# Patient Record
Sex: Male | Born: 1950
Health system: Southern US, Community
[De-identification: ages and names within clinical notes are randomized; demographics above are authoritative.]

## PROBLEM LIST (undated history)

## (undated) DIAGNOSIS — E119 Type 2 diabetes mellitus without complications: Secondary | ICD-10-CM

## (undated) DIAGNOSIS — I509 Heart failure, unspecified: Secondary | ICD-10-CM

## (undated) DIAGNOSIS — I4891 Unspecified atrial fibrillation: Secondary | ICD-10-CM

## (undated) DIAGNOSIS — Z95810 Presence of automatic (implantable) cardiac defibrillator: Secondary | ICD-10-CM

## (undated) DIAGNOSIS — E039 Hypothyroidism, unspecified: Secondary | ICD-10-CM

## (undated) DIAGNOSIS — I429 Cardiomyopathy, unspecified: Secondary | ICD-10-CM

## (undated) DIAGNOSIS — N189 Chronic kidney disease, unspecified: Secondary | ICD-10-CM

## (undated) DIAGNOSIS — I2510 Atherosclerotic heart disease of native coronary artery without angina pectoris: Secondary | ICD-10-CM

## (undated) DIAGNOSIS — I219 Acute myocardial infarction, unspecified: Secondary | ICD-10-CM

## (undated) DIAGNOSIS — D649 Anemia, unspecified: Secondary | ICD-10-CM

## (undated) DIAGNOSIS — M869 Osteomyelitis, unspecified: Secondary | ICD-10-CM

## (undated) DIAGNOSIS — D696 Thrombocytopenia, unspecified: Secondary | ICD-10-CM

## (undated) HISTORY — PX: CORONARY ARTERY BYPASS GRAFT: SHX141

---

## 2006-09-29 ENCOUNTER — Ambulatory Visit: Payer: Self-pay | Admitting: Cardiothoracic Surgery

## 2006-10-06 ENCOUNTER — Ambulatory Visit: Payer: Self-pay | Admitting: Cardiothoracic Surgery

## 2006-11-18 ENCOUNTER — Ambulatory Visit: Payer: Self-pay | Admitting: Cardiothoracic Surgery

## 2015-05-29 DIAGNOSIS — Z95810 Presence of automatic (implantable) cardiac defibrillator: Secondary | ICD-10-CM | POA: Insufficient documentation

## 2015-05-29 DIAGNOSIS — I429 Cardiomyopathy, unspecified: Secondary | ICD-10-CM | POA: Insufficient documentation

## 2015-08-15 DIAGNOSIS — E1142 Type 2 diabetes mellitus with diabetic polyneuropathy: Secondary | ICD-10-CM | POA: Insufficient documentation

## 2015-08-15 DIAGNOSIS — E11610 Type 2 diabetes mellitus with diabetic neuropathic arthropathy: Secondary | ICD-10-CM | POA: Insufficient documentation

## 2015-08-15 DIAGNOSIS — S98132A Complete traumatic amputation of one left lesser toe, initial encounter: Secondary | ICD-10-CM | POA: Insufficient documentation

## 2015-10-04 DIAGNOSIS — I484 Atypical atrial flutter: Secondary | ICD-10-CM | POA: Insufficient documentation

## 2015-10-04 DIAGNOSIS — I10 Essential (primary) hypertension: Secondary | ICD-10-CM | POA: Insufficient documentation

## 2015-10-04 DIAGNOSIS — I255 Ischemic cardiomyopathy: Secondary | ICD-10-CM | POA: Insufficient documentation

## 2015-10-04 DIAGNOSIS — E7800 Pure hypercholesterolemia, unspecified: Secondary | ICD-10-CM | POA: Insufficient documentation

## 2016-02-13 DIAGNOSIS — E11610 Type 2 diabetes mellitus with diabetic neuropathic arthropathy: Secondary | ICD-10-CM | POA: Insufficient documentation

## 2016-05-12 DIAGNOSIS — N179 Acute kidney failure, unspecified: Secondary | ICD-10-CM

## 2016-05-12 DIAGNOSIS — N390 Urinary tract infection, site not specified: Secondary | ICD-10-CM

## 2016-05-13 DIAGNOSIS — R7881 Bacteremia: Secondary | ICD-10-CM

## 2016-05-13 DIAGNOSIS — D696 Thrombocytopenia, unspecified: Secondary | ICD-10-CM

## 2016-05-13 DIAGNOSIS — N390 Urinary tract infection, site not specified: Secondary | ICD-10-CM | POA: Diagnosis not present

## 2016-05-13 DIAGNOSIS — D649 Anemia, unspecified: Secondary | ICD-10-CM

## 2016-05-13 DIAGNOSIS — N179 Acute kidney failure, unspecified: Secondary | ICD-10-CM | POA: Diagnosis not present

## 2016-05-14 DIAGNOSIS — R7881 Bacteremia: Secondary | ICD-10-CM | POA: Diagnosis not present

## 2016-05-14 DIAGNOSIS — D649 Anemia, unspecified: Secondary | ICD-10-CM | POA: Diagnosis not present

## 2016-05-14 DIAGNOSIS — N390 Urinary tract infection, site not specified: Secondary | ICD-10-CM | POA: Diagnosis not present

## 2016-05-14 DIAGNOSIS — N179 Acute kidney failure, unspecified: Secondary | ICD-10-CM | POA: Diagnosis not present

## 2016-05-15 DIAGNOSIS — E11621 Type 2 diabetes mellitus with foot ulcer: Secondary | ICD-10-CM | POA: Diagnosis not present

## 2016-05-15 DIAGNOSIS — L97509 Non-pressure chronic ulcer of other part of unspecified foot with unspecified severity: Secondary | ICD-10-CM

## 2016-05-16 ENCOUNTER — Encounter: Payer: Self-pay | Admitting: Sports Medicine

## 2016-05-16 DIAGNOSIS — R7881 Bacteremia: Secondary | ICD-10-CM

## 2016-05-16 DIAGNOSIS — A4101 Sepsis due to Methicillin susceptible Staphylococcus aureus: Secondary | ICD-10-CM | POA: Diagnosis not present

## 2016-05-16 DIAGNOSIS — N179 Acute kidney failure, unspecified: Secondary | ICD-10-CM

## 2016-05-16 DIAGNOSIS — L97501 Non-pressure chronic ulcer of other part of unspecified foot limited to breakdown of skin: Secondary | ICD-10-CM

## 2016-05-16 DIAGNOSIS — E11621 Type 2 diabetes mellitus with foot ulcer: Secondary | ICD-10-CM

## 2016-05-17 DIAGNOSIS — N179 Acute kidney failure, unspecified: Secondary | ICD-10-CM | POA: Diagnosis not present

## 2016-05-17 DIAGNOSIS — A4101 Sepsis due to Methicillin susceptible Staphylococcus aureus: Secondary | ICD-10-CM | POA: Diagnosis not present

## 2016-05-17 DIAGNOSIS — E11621 Type 2 diabetes mellitus with foot ulcer: Secondary | ICD-10-CM | POA: Diagnosis not present

## 2016-05-17 DIAGNOSIS — R7881 Bacteremia: Secondary | ICD-10-CM | POA: Diagnosis not present

## 2016-05-18 DIAGNOSIS — E11621 Type 2 diabetes mellitus with foot ulcer: Secondary | ICD-10-CM | POA: Diagnosis not present

## 2016-05-18 DIAGNOSIS — A4101 Sepsis due to Methicillin susceptible Staphylococcus aureus: Secondary | ICD-10-CM | POA: Diagnosis not present

## 2016-05-18 DIAGNOSIS — N179 Acute kidney failure, unspecified: Secondary | ICD-10-CM | POA: Diagnosis not present

## 2016-05-18 DIAGNOSIS — R7881 Bacteremia: Secondary | ICD-10-CM | POA: Diagnosis not present

## 2016-05-22 ENCOUNTER — Telehealth: Payer: Self-pay | Admitting: *Deleted

## 2016-05-22 NOTE — Telephone Encounter (Signed)
Jose Heath states she wanted to make sure the latest orders were signed for wound care and PT.

## 2016-05-22 NOTE — Telephone Encounter (Signed)
Malachy Mood should have faxed it. I signed off. Im ok with wet to dry until he comes to the office. Good for PT, gait and balance as well. -Dr. Cannon Kettle

## 2016-05-30 DIAGNOSIS — I482 Chronic atrial fibrillation: Secondary | ICD-10-CM

## 2016-05-30 DIAGNOSIS — R296 Repeated falls: Secondary | ICD-10-CM

## 2016-05-30 DIAGNOSIS — R441 Visual hallucinations: Secondary | ICD-10-CM | POA: Diagnosis not present

## 2016-05-30 DIAGNOSIS — E11621 Type 2 diabetes mellitus with foot ulcer: Secondary | ICD-10-CM | POA: Diagnosis not present

## 2016-05-30 DIAGNOSIS — I10 Essential (primary) hypertension: Secondary | ICD-10-CM

## 2016-05-30 DIAGNOSIS — N179 Acute kidney failure, unspecified: Secondary | ICD-10-CM | POA: Diagnosis not present

## 2016-05-30 DIAGNOSIS — E039 Hypothyroidism, unspecified: Secondary | ICD-10-CM

## 2016-05-30 DIAGNOSIS — A4901 Methicillin susceptible Staphylococcus aureus infection, unspecified site: Secondary | ICD-10-CM | POA: Diagnosis not present

## 2016-05-30 DIAGNOSIS — E871 Hypo-osmolality and hyponatremia: Secondary | ICD-10-CM | POA: Diagnosis not present

## 2016-05-30 DIAGNOSIS — M7989 Other specified soft tissue disorders: Secondary | ICD-10-CM

## 2016-05-30 DIAGNOSIS — E860 Dehydration: Secondary | ICD-10-CM | POA: Diagnosis not present

## 2016-05-30 DIAGNOSIS — E118 Type 2 diabetes mellitus with unspecified complications: Secondary | ICD-10-CM | POA: Diagnosis not present

## 2016-05-30 DIAGNOSIS — D649 Anemia, unspecified: Secondary | ICD-10-CM | POA: Diagnosis not present

## 2016-05-30 DIAGNOSIS — R531 Weakness: Secondary | ICD-10-CM

## 2016-05-30 DIAGNOSIS — R748 Abnormal levels of other serum enzymes: Secondary | ICD-10-CM | POA: Diagnosis not present

## 2016-05-31 DIAGNOSIS — E118 Type 2 diabetes mellitus with unspecified complications: Secondary | ICD-10-CM | POA: Diagnosis not present

## 2016-05-31 DIAGNOSIS — R296 Repeated falls: Secondary | ICD-10-CM | POA: Diagnosis not present

## 2016-05-31 DIAGNOSIS — E11621 Type 2 diabetes mellitus with foot ulcer: Secondary | ICD-10-CM | POA: Diagnosis not present

## 2016-05-31 DIAGNOSIS — E860 Dehydration: Secondary | ICD-10-CM | POA: Diagnosis not present

## 2016-05-31 DIAGNOSIS — R441 Visual hallucinations: Secondary | ICD-10-CM | POA: Diagnosis not present

## 2016-05-31 DIAGNOSIS — N179 Acute kidney failure, unspecified: Secondary | ICD-10-CM | POA: Diagnosis not present

## 2016-05-31 DIAGNOSIS — R748 Abnormal levels of other serum enzymes: Secondary | ICD-10-CM | POA: Diagnosis not present

## 2016-05-31 DIAGNOSIS — R531 Weakness: Secondary | ICD-10-CM | POA: Diagnosis not present

## 2016-05-31 DIAGNOSIS — D649 Anemia, unspecified: Secondary | ICD-10-CM | POA: Diagnosis not present

## 2016-05-31 DIAGNOSIS — A4901 Methicillin susceptible Staphylococcus aureus infection, unspecified site: Secondary | ICD-10-CM | POA: Diagnosis not present

## 2016-05-31 DIAGNOSIS — E871 Hypo-osmolality and hyponatremia: Secondary | ICD-10-CM | POA: Diagnosis not present

## 2016-05-31 DIAGNOSIS — M7989 Other specified soft tissue disorders: Secondary | ICD-10-CM | POA: Diagnosis not present

## 2016-06-01 DIAGNOSIS — R748 Abnormal levels of other serum enzymes: Secondary | ICD-10-CM | POA: Diagnosis not present

## 2016-06-01 DIAGNOSIS — A4901 Methicillin susceptible Staphylococcus aureus infection, unspecified site: Secondary | ICD-10-CM | POA: Diagnosis not present

## 2016-06-01 DIAGNOSIS — M7989 Other specified soft tissue disorders: Secondary | ICD-10-CM | POA: Diagnosis not present

## 2016-06-01 DIAGNOSIS — R296 Repeated falls: Secondary | ICD-10-CM | POA: Diagnosis not present

## 2016-06-01 DIAGNOSIS — R531 Weakness: Secondary | ICD-10-CM | POA: Diagnosis not present

## 2016-06-01 DIAGNOSIS — N179 Acute kidney failure, unspecified: Secondary | ICD-10-CM | POA: Diagnosis not present

## 2016-06-01 DIAGNOSIS — D649 Anemia, unspecified: Secondary | ICD-10-CM | POA: Diagnosis not present

## 2016-06-01 DIAGNOSIS — R441 Visual hallucinations: Secondary | ICD-10-CM | POA: Diagnosis not present

## 2016-06-01 DIAGNOSIS — E118 Type 2 diabetes mellitus with unspecified complications: Secondary | ICD-10-CM | POA: Diagnosis not present

## 2016-06-01 DIAGNOSIS — E11621 Type 2 diabetes mellitus with foot ulcer: Secondary | ICD-10-CM | POA: Diagnosis not present

## 2016-06-01 DIAGNOSIS — E860 Dehydration: Secondary | ICD-10-CM | POA: Diagnosis not present

## 2016-06-01 DIAGNOSIS — E871 Hypo-osmolality and hyponatremia: Secondary | ICD-10-CM | POA: Diagnosis not present

## 2016-06-02 DIAGNOSIS — R441 Visual hallucinations: Secondary | ICD-10-CM | POA: Diagnosis not present

## 2016-06-02 DIAGNOSIS — E118 Type 2 diabetes mellitus with unspecified complications: Secondary | ICD-10-CM | POA: Diagnosis not present

## 2016-06-02 DIAGNOSIS — E11621 Type 2 diabetes mellitus with foot ulcer: Secondary | ICD-10-CM | POA: Diagnosis not present

## 2016-06-02 DIAGNOSIS — R748 Abnormal levels of other serum enzymes: Secondary | ICD-10-CM | POA: Diagnosis not present

## 2016-06-02 DIAGNOSIS — N179 Acute kidney failure, unspecified: Secondary | ICD-10-CM | POA: Diagnosis not present

## 2016-06-02 DIAGNOSIS — R531 Weakness: Secondary | ICD-10-CM | POA: Diagnosis not present

## 2016-06-02 DIAGNOSIS — R296 Repeated falls: Secondary | ICD-10-CM | POA: Diagnosis not present

## 2016-06-02 DIAGNOSIS — M7989 Other specified soft tissue disorders: Secondary | ICD-10-CM | POA: Diagnosis not present

## 2016-06-02 DIAGNOSIS — D649 Anemia, unspecified: Secondary | ICD-10-CM | POA: Diagnosis not present

## 2016-06-02 DIAGNOSIS — A4901 Methicillin susceptible Staphylococcus aureus infection, unspecified site: Secondary | ICD-10-CM | POA: Diagnosis not present

## 2016-06-02 DIAGNOSIS — E871 Hypo-osmolality and hyponatremia: Secondary | ICD-10-CM | POA: Diagnosis not present

## 2016-06-02 DIAGNOSIS — E860 Dehydration: Secondary | ICD-10-CM | POA: Diagnosis not present

## 2016-06-03 DIAGNOSIS — A4901 Methicillin susceptible Staphylococcus aureus infection, unspecified site: Secondary | ICD-10-CM | POA: Diagnosis not present

## 2016-06-03 DIAGNOSIS — D649 Anemia, unspecified: Secondary | ICD-10-CM | POA: Diagnosis not present

## 2016-06-03 DIAGNOSIS — R748 Abnormal levels of other serum enzymes: Secondary | ICD-10-CM | POA: Diagnosis not present

## 2016-06-03 DIAGNOSIS — N179 Acute kidney failure, unspecified: Secondary | ICD-10-CM | POA: Diagnosis not present

## 2016-06-03 DIAGNOSIS — E871 Hypo-osmolality and hyponatremia: Secondary | ICD-10-CM | POA: Diagnosis not present

## 2016-06-03 DIAGNOSIS — E11621 Type 2 diabetes mellitus with foot ulcer: Secondary | ICD-10-CM | POA: Diagnosis not present

## 2016-06-03 DIAGNOSIS — E860 Dehydration: Secondary | ICD-10-CM | POA: Diagnosis not present

## 2016-06-03 DIAGNOSIS — R296 Repeated falls: Secondary | ICD-10-CM | POA: Diagnosis not present

## 2016-06-03 DIAGNOSIS — E118 Type 2 diabetes mellitus with unspecified complications: Secondary | ICD-10-CM | POA: Diagnosis not present

## 2016-06-03 DIAGNOSIS — R441 Visual hallucinations: Secondary | ICD-10-CM | POA: Diagnosis not present

## 2016-06-03 DIAGNOSIS — M7989 Other specified soft tissue disorders: Secondary | ICD-10-CM | POA: Diagnosis not present

## 2016-06-03 DIAGNOSIS — R531 Weakness: Secondary | ICD-10-CM | POA: Diagnosis not present

## 2016-06-04 ENCOUNTER — Ambulatory Visit: Payer: Medicare Other | Admitting: Sports Medicine

## 2016-07-09 ENCOUNTER — Encounter: Payer: Self-pay | Admitting: Sports Medicine

## 2016-07-30 DIAGNOSIS — R601 Generalized edema: Secondary | ICD-10-CM | POA: Insufficient documentation

## 2016-08-13 DIAGNOSIS — E038 Other specified hypothyroidism: Secondary | ICD-10-CM | POA: Insufficient documentation

## 2016-08-27 DIAGNOSIS — L0390 Cellulitis, unspecified: Secondary | ICD-10-CM | POA: Diagnosis not present

## 2016-08-27 DIAGNOSIS — M869 Osteomyelitis, unspecified: Secondary | ICD-10-CM | POA: Diagnosis not present

## 2016-08-27 DIAGNOSIS — E039 Hypothyroidism, unspecified: Secondary | ICD-10-CM | POA: Diagnosis not present

## 2016-08-27 DIAGNOSIS — E11621 Type 2 diabetes mellitus with foot ulcer: Secondary | ICD-10-CM | POA: Diagnosis not present

## 2016-08-27 DIAGNOSIS — A480 Gas gangrene: Secondary | ICD-10-CM | POA: Diagnosis not present

## 2016-08-27 DIAGNOSIS — I482 Chronic atrial fibrillation: Secondary | ICD-10-CM

## 2016-08-27 DIAGNOSIS — E118 Type 2 diabetes mellitus with unspecified complications: Secondary | ICD-10-CM | POA: Diagnosis not present

## 2016-08-28 DIAGNOSIS — E118 Type 2 diabetes mellitus with unspecified complications: Secondary | ICD-10-CM | POA: Diagnosis not present

## 2016-08-28 DIAGNOSIS — A480 Gas gangrene: Secondary | ICD-10-CM

## 2016-08-28 DIAGNOSIS — L0390 Cellulitis, unspecified: Secondary | ICD-10-CM

## 2016-08-28 DIAGNOSIS — M869 Osteomyelitis, unspecified: Secondary | ICD-10-CM

## 2016-08-28 DIAGNOSIS — E039 Hypothyroidism, unspecified: Secondary | ICD-10-CM | POA: Diagnosis not present

## 2016-08-28 DIAGNOSIS — E11621 Type 2 diabetes mellitus with foot ulcer: Secondary | ICD-10-CM | POA: Diagnosis not present

## 2016-08-28 DIAGNOSIS — I482 Chronic atrial fibrillation: Secondary | ICD-10-CM | POA: Diagnosis not present

## 2016-08-29 ENCOUNTER — Encounter: Payer: Self-pay | Admitting: Sports Medicine

## 2016-08-29 DIAGNOSIS — A480 Gas gangrene: Secondary | ICD-10-CM | POA: Diagnosis not present

## 2016-08-29 DIAGNOSIS — M869 Osteomyelitis, unspecified: Secondary | ICD-10-CM | POA: Diagnosis not present

## 2016-08-29 DIAGNOSIS — I482 Chronic atrial fibrillation: Secondary | ICD-10-CM | POA: Diagnosis not present

## 2016-08-29 DIAGNOSIS — L0390 Cellulitis, unspecified: Secondary | ICD-10-CM | POA: Diagnosis not present

## 2016-08-29 DIAGNOSIS — E039 Hypothyroidism, unspecified: Secondary | ICD-10-CM | POA: Diagnosis not present

## 2016-08-30 DIAGNOSIS — E039 Hypothyroidism, unspecified: Secondary | ICD-10-CM | POA: Diagnosis not present

## 2016-08-30 DIAGNOSIS — L0390 Cellulitis, unspecified: Secondary | ICD-10-CM | POA: Diagnosis not present

## 2016-08-30 DIAGNOSIS — I482 Chronic atrial fibrillation: Secondary | ICD-10-CM | POA: Diagnosis not present

## 2016-08-30 DIAGNOSIS — A480 Gas gangrene: Secondary | ICD-10-CM | POA: Diagnosis not present

## 2016-08-30 DIAGNOSIS — M869 Osteomyelitis, unspecified: Secondary | ICD-10-CM | POA: Diagnosis not present

## 2016-08-31 DIAGNOSIS — M869 Osteomyelitis, unspecified: Secondary | ICD-10-CM | POA: Diagnosis not present

## 2016-08-31 DIAGNOSIS — L0390 Cellulitis, unspecified: Secondary | ICD-10-CM | POA: Diagnosis not present

## 2016-08-31 DIAGNOSIS — A480 Gas gangrene: Secondary | ICD-10-CM | POA: Diagnosis not present

## 2016-08-31 DIAGNOSIS — E118 Type 2 diabetes mellitus with unspecified complications: Secondary | ICD-10-CM | POA: Diagnosis not present

## 2016-08-31 DIAGNOSIS — E039 Hypothyroidism, unspecified: Secondary | ICD-10-CM | POA: Diagnosis not present

## 2016-08-31 DIAGNOSIS — E11621 Type 2 diabetes mellitus with foot ulcer: Secondary | ICD-10-CM | POA: Diagnosis not present

## 2016-08-31 DIAGNOSIS — I482 Chronic atrial fibrillation: Secondary | ICD-10-CM | POA: Diagnosis not present

## 2016-09-01 DIAGNOSIS — I482 Chronic atrial fibrillation: Secondary | ICD-10-CM | POA: Diagnosis not present

## 2016-09-01 DIAGNOSIS — E039 Hypothyroidism, unspecified: Secondary | ICD-10-CM | POA: Diagnosis not present

## 2016-09-01 DIAGNOSIS — L0390 Cellulitis, unspecified: Secondary | ICD-10-CM | POA: Diagnosis not present

## 2016-09-01 DIAGNOSIS — E118 Type 2 diabetes mellitus with unspecified complications: Secondary | ICD-10-CM | POA: Diagnosis not present

## 2016-09-01 DIAGNOSIS — A480 Gas gangrene: Secondary | ICD-10-CM | POA: Diagnosis not present

## 2016-09-01 DIAGNOSIS — M869 Osteomyelitis, unspecified: Secondary | ICD-10-CM | POA: Diagnosis not present

## 2016-09-01 DIAGNOSIS — E11621 Type 2 diabetes mellitus with foot ulcer: Secondary | ICD-10-CM | POA: Diagnosis not present

## 2016-09-02 DIAGNOSIS — E118 Type 2 diabetes mellitus with unspecified complications: Secondary | ICD-10-CM | POA: Diagnosis not present

## 2016-09-02 DIAGNOSIS — M869 Osteomyelitis, unspecified: Secondary | ICD-10-CM | POA: Diagnosis not present

## 2016-09-02 DIAGNOSIS — A4102 Sepsis due to Methicillin resistant Staphylococcus aureus: Secondary | ICD-10-CM | POA: Diagnosis not present

## 2016-09-02 DIAGNOSIS — E11621 Type 2 diabetes mellitus with foot ulcer: Secondary | ICD-10-CM | POA: Diagnosis not present

## 2016-09-02 DIAGNOSIS — I482 Chronic atrial fibrillation: Secondary | ICD-10-CM | POA: Diagnosis not present

## 2016-09-02 DIAGNOSIS — L0390 Cellulitis, unspecified: Secondary | ICD-10-CM | POA: Diagnosis not present

## 2016-09-02 DIAGNOSIS — E039 Hypothyroidism, unspecified: Secondary | ICD-10-CM | POA: Diagnosis not present

## 2016-09-02 DIAGNOSIS — A480 Gas gangrene: Secondary | ICD-10-CM | POA: Diagnosis not present

## 2016-09-03 NOTE — Telephone Encounter (Addendum)
Jose Heath - [facility] states Dr. Cannon Kettle saw pt in hospital, and he was discharged yesterday, and discharge orders states wound care orders to be from Dr. Cannon Kettle, and she is at pt's home now. Jose Heath states pt was given bottle of iodoform. I told Jose Heath to cleanse foot and pack wound gently and apply same dressing type as is currently on pt's foot, and I will call with new orders. Jose Heath - [facility] called to see if Dr. Cannon Kettle had sent orders. I checked pt's chart and Dr. Cannon Kettle wanted pt to have the dressing changes as described 3x week. I informed Jose Heath. Routed orders for pt to be seen by Dr. Cannon Kettle within 2 weeks.09/15/2016-[name] - [facility] states pt refused PICC line care and refused flush or lab draw, pt did allow her to change the dressing on the foot. I informed [name] - [facility] of Dr. Leeanne Rio orders and she states pt refused to let her flush the PICC even after she said they had to do so to keep it open. I told her I would call pt and give him Dr. Leeanne Rio orders, she states she's never seen him like that. I spoke with pt and he said he and his sister had a falling out and he boxed up the medicine he hasn't had it in 3 days and it won't matter cause he sees Dr.Stover Wednesday. I told pt I would tell Dr. Cannon Kettle of his decision and he said, "I don't care what you tell her." pt hung up the phone, and I contacted [name] - [facility] with the information and informed Dr. Cannon Kettle.10/02/2016-Faxed completed [employer] Request for [facility] Prescription Drug Coverage Determination with clinicals and demographics to [employer].

## 2016-09-03 NOTE — Telephone Encounter (Signed)
Yes 3x per week is fine

## 2016-09-03 NOTE — Telephone Encounter (Signed)
That's correct. Iodoform packing covered with 4x4 guaze, abd, kerlix and ace wrap Also make sure patient gets an appointment to see me within 2 weeks Thanks Dr. Cannon Kettle

## 2016-09-12 ENCOUNTER — Telehealth: Payer: Self-pay | Admitting: Sports Medicine

## 2016-09-12 NOTE — Telephone Encounter (Signed)
[facility] nurse Jenny Reichmann would like for you to give them a call about his RX. She states she doesn't see his RX anywhere. And she has some other questions for you too. Contact number [phone]

## 2016-09-15 NOTE — Telephone Encounter (Signed)
Thank you. I will talk with him when he comes to office on Wednesday. Thanks Dr. Cannon Kettle

## 2016-09-15 NOTE — Telephone Encounter (Signed)
Advised nurse to encourage patient to provide care for Picc line because the risk of infection if not properly cared for and to help prevent worsening or recurrent infection in the foot -Dr. Cannon Kettle

## 2016-09-17 ENCOUNTER — Encounter: Payer: Self-pay | Admitting: Sports Medicine

## 2016-09-17 ENCOUNTER — Telehealth: Payer: Self-pay | Admitting: *Deleted

## 2016-09-17 ENCOUNTER — Ambulatory Visit (INDEPENDENT_AMBULATORY_CARE_PROVIDER_SITE_OTHER): Payer: Medicare Other | Admitting: Sports Medicine

## 2016-09-17 DIAGNOSIS — E079 Disorder of thyroid, unspecified: Secondary | ICD-10-CM | POA: Insufficient documentation

## 2016-09-17 DIAGNOSIS — C4490 Unspecified malignant neoplasm of skin, unspecified: Secondary | ICD-10-CM | POA: Insufficient documentation

## 2016-09-17 DIAGNOSIS — I4892 Unspecified atrial flutter: Secondary | ICD-10-CM | POA: Insufficient documentation

## 2016-09-17 DIAGNOSIS — L97512 Non-pressure chronic ulcer of other part of right foot with fat layer exposed: Secondary | ICD-10-CM

## 2016-09-17 DIAGNOSIS — Z89421 Acquired absence of other right toe(s): Secondary | ICD-10-CM

## 2016-09-17 DIAGNOSIS — E785 Hyperlipidemia, unspecified: Secondary | ICD-10-CM | POA: Insufficient documentation

## 2016-09-17 DIAGNOSIS — L089 Local infection of the skin and subcutaneous tissue, unspecified: Secondary | ICD-10-CM

## 2016-09-17 DIAGNOSIS — E119 Type 2 diabetes mellitus without complications: Secondary | ICD-10-CM | POA: Insufficient documentation

## 2016-09-17 DIAGNOSIS — M79672 Pain in left foot: Secondary | ICD-10-CM | POA: Insufficient documentation

## 2016-09-17 DIAGNOSIS — H9190 Unspecified hearing loss, unspecified ear: Secondary | ICD-10-CM | POA: Insufficient documentation

## 2016-09-17 DIAGNOSIS — I2510 Atherosclerotic heart disease of native coronary artery without angina pectoris: Secondary | ICD-10-CM | POA: Insufficient documentation

## 2016-09-17 DIAGNOSIS — Z95810 Presence of automatic (implantable) cardiac defibrillator: Secondary | ICD-10-CM | POA: Insufficient documentation

## 2016-09-17 DIAGNOSIS — M1990 Unspecified osteoarthritis, unspecified site: Secondary | ICD-10-CM | POA: Insufficient documentation

## 2016-09-17 MED ORDER — LINEZOLID 600 MG PO TABS: 600.0000 mg | ORAL_TABLET | Freq: Two times a day (BID) | ORAL | 0 refills | Status: AC

## 2016-09-17 NOTE — Telephone Encounter (Addendum)
----- Message from Jose Heath, DPM sent at 09/17/2016 11:16 AM EDT ----- Regarding: Nursing orders Right foot apply PRISMA or durafiber covered with 4x4, kerlix, coban 3x per week Remove picc line Rx sent to patients pharmacy for zyvox 600mg  po bid x 6 weeks Patient has appointment to see me on next week to remove suture at right foot  -Dr. Cannon Heath. Faxed copy of 09/17/2016 11:16am orders to [facility]. Read orders to Jose Heath - [facility].09/19/2016-Jose Heath - [facility] asked if prior authorization has been received for Zyvox. 09/22/2016-[employer] FAXED [place], REFERRAL NUMBER: [id number], VALID 07/05/2016 EXPIRATION: 10/17/2016. Faxed to [employer].09/30/2016-[facility] states does not have pt by that name.Unable to contact pt to get name of the nursing facility he is currently staying. I spoke with pt, he states he is at home. I asked pt which home health care agency cares for him at home and he said [facility]. I spoke with Jose Heath - [facility] and she states pt in in the [facility] [place] office [phone], fax [phone]. Faxed Dr.Stover orders of 09/26/2016 10:17am to [facility] [place], and required order form, clinicals and demographics to [facility]. 10/06/2016-Jose Heath - [facility] states pt's co-pay $3.70 for 4-5 tubes, and [facility] will contact pt to see if pt would like to proceed with treatment order. Jose Heath requested fax [facility] cover sheet, approval letter and demographics, ATTN: Beverlee Nims. Requested information faxed to [facility].10/06/2016-Jose Heath - [facility] suggest with the size of the wound 2 tube dispense for 30 days and the orders had stated apply 3 times week which is considered off label instructions, label instructions are 12 hour on and 12 hours off or daily.10/07/2016-Jose Heath - [facility] states pharmacist needs to speak with me and transferred. Jose Heath - [employer] [facility]	[place] asked for dispense amount and instructions, I okayed 2 tubes to be dispensed with 3 refills and 12 hours on and 12  hours off as instructions. [place] for [employer] pt will not accept order or information concerning [facility], states he does not know anything about the medication. I spoke with pt and he said he got a package last week and it took a week to get her and it was pads, and he won't ever use all of them. I told pt the order I was calling on was a medication and would need to be refrigerated, so I needed him to let me know if he wanted to continue the medication Regranex that Dr. Cannon Heath ordered it would cost him $3.70 copay, pt states yes and I told him I would have [place] for [facility] call again. I informed [name] [facility] that pt had been explained the importance of the Regranex and would accept their call and medication.[name] called from [employer]. I spoke with [name] and she stated that after speaking with pt and wife and informing pt he would have to pay for the medication with a credit card, pt refused to accept the medication. I asked Jose Heath - [place] to put [facility] on hold and Dr. Cannon Heath would discuss with pt at next appt. 10/13/2016-Pt's wife, Jose Heath states pt had and accident this weekend with a lawnmower and cut off part of his toe, and ED wanted pt to be seen by Dr. Cannon Heath within 48 hours of release, please call to schedule.10/16/2016-I informed [name] - [facility] that Dr. Cannon Heath said pt did not want to pay for the Regranex. 11/12/2016-Jose Heath, [name] states pt has pus coming out of right foot and hemoglobin 6, with abnormal liver function, and  he won't let anyone take care of his foot and he won't go to the ER. Pt was last seen by Jose Heath on his [employer]. Jose Heath tried to get pt to let her change his dressing, but pt states he is going to walk in to the [facility] in [place] for Dr. Cannon Heath to take care of. I left message on home phone to call me to let me help him make an appt for tomorrow. Then I called the [phone]  mobile phone listed and recording states it was a [employer]. I called Jose Heath and told her I wasn't able to contact pt, and had left a message on the home phone, and got the cellphone number she had [phone]. I told Jose Heath to have pt go to [place] office about 11:00am tomorrow if she could get him. I called the cell phone and the number was busy. 11/13/2016-DrCannon Heath orders of 11/13/2016 12:21pm iodosorb to right foot and restarted pt on antibiotics, called to Jose Heath - [facility] and faxed to [facility]. [name] - [facility] called to see if pt had gone to see Dr. Cannon Heath, and new orders. I told her I did not have the clinicals for today, but the new orders had been called to Jose Heath and faxed to [facility].11/19/2016-I left message informing pt I had sent ordered antibiotics to his pharmacy this morning.Jose Heath - [facility] states she changed pt's dressing today, but was informed pt has changed his insurance to [place] and [facility] can not see him, will need to send orders to [facility] possibly. Faxed required form, Dr. Leeanne Rio 11/19/2016 12:32pm orders, clinicals and demographics to [facility]. Left message requesting pt call with the [employer] policy information and to expect a call from [facility] to be established into their care. I spoke with pt and informed I had left a message informing him that Dr. Cannon Heath had ordered 2 antibiotics, pt states he is picked up the antibiotics. I told pt Jose Heath - [facility] states he had changed to [place] and they could no longer for him through their agency. I told pt I was trying to refer him to [facility] and would need copy of his [employer]na card. Pt states he did not receive a [employer], but his pharmacist has a copy. I spoke with the pharmacist at [facility] and he gave me [employer] Part D policy #[id number], and the drug plan [phone]. I spoke to Member Svc for the Medication Part D and they gave me Member Svc for the [facility] [phone]. I called Member Svc for the [facility], she states pt's Medical policy # is the same as his medication [id number], I asked her to mail pt a card and she said he would get it in 7-10 days. I informed pt the [facility] Member Policy # was the same as his Part D #, and to look for a new card in the mail. Pt states understanding. Faxed required form with note informing of pt's [employer] number, clinicals and demographics to [facility].

## 2016-09-17 NOTE — Progress Notes (Addendum)
Subjective: Jose Heath is a 66 y.o. male patient seen today in office for POV #1, S/P right 5th toe amputation secondary to gangrene. Patient denies pain at surgical site, denies calf pain, denies headache, chest pain, shortness of breath, nausea, vomiting, fever, or chills. No other issues noted.   Patient Active Problem List   Diagnosis Date Noted  . Arthritis 09/17/2016  . Atrial flutter ([place]) 09/17/2016  . Coronary artery disease 09/17/2016  . Diabetes mellitus ([place]) 09/17/2016  . Disease of thyroid gland 09/17/2016  . HOH (hard of hearing) 09/17/2016  . Hyperlipidemia 09/17/2016  . ICD (implantable cardioverter-defibrillator) in place 09/17/2016  . Left foot pain 09/17/2016  . Skin cancer 09/17/2016  . TSH (thyroid-stimulating hormone deficiency) 08/13/2016  . Anasarca 07/30/2016  . Charcot foot due to diabetes mellitus ([place]) 02/13/2016  . Atypical atrial flutter ([place]) 10/04/2015  . Essential hypertension 10/04/2015  . Ischemic cardiomyopathy 10/04/2015  . Pure hypercholesterolemia 10/04/2015  . Amputated toe of left foot ([place]) 08/15/2015  . Diabetic peripheral neuropathy ([place]) 08/15/2015  . Type 2 diabetes mellitus with Charcot's joint of left foot ([place]) 08/15/2015  . Cardiac defibrillator in place 05/29/2015  . Cardiomyopathy ([place]) 05/29/2015  . Dual ICD (implantable cardioverter-defibrillator) in place 05/29/2015    No current outpatient prescriptions on file prior to visit.   No current facility-administered medications on file prior to visit.     Not on File  Objective: There were no vitals filed for this visit.  General: No acute distress, AAOx3  Right foot: Sutures proximally intact with no gapping or dehiscence at surgical site with open amputation wound measuring 6x2x0.4cm with fibrogranular base, mild swelling to right foot, no erythema, no warmth, no drainage, no acute signs of infection noted, Capillary fill time <3 seconds in all digits, gross sensation  present via light touch to right foot. No pain or crepitation with range of motion right foot.  No pain with calf compression.   Assessment and Plan:  Problem List Items Addressed This Visit    None    Visit Diagnoses    S/P amputation of lesser toe, right (HCC)    -  Primary   Relevant Medications   linezolid (ZYVOX) 600 MG tablet   Right foot infection       Relevant Medications   clindamycin (CLEOCIN) 300 MG capsule   INVANZ 1 g injection   linezolid (ZYVOX) 600 MG tablet   Foot ulceration, right, with fat layer exposed ([place])           -Patient seen and evaluated -Applied PRISMA and dry sterile dressing to surgical site right secured with ACE wrap and stockinet  -Nursing to continue with same dressings as above -Rx Zyvox 600mg  BID x 6 weeks to end 10-29-16 -Orders placed to pull/remove picc line -Advised patient to continue with post-op shoe on right -Advised patient to limit activity to necessity  -Advised patient to ice and elevate as necessary  -Will plan for suture removal and xray at next office visit and to order Regranex to place at amputation site. In the meantime, patient to call office if any issues or problems arise.   [name], DPM

## 2016-09-18 ENCOUNTER — Telehealth: Payer: Self-pay | Admitting: Sports Medicine

## 2016-09-18 NOTE — Telephone Encounter (Signed)
Christy from pharmacy called stating his medication needs prior authorization and to see if we are working on this

## 2016-09-26 ENCOUNTER — Encounter: Payer: Self-pay | Admitting: Sports Medicine

## 2016-09-26 ENCOUNTER — Ambulatory Visit (INDEPENDENT_AMBULATORY_CARE_PROVIDER_SITE_OTHER): Payer: Medicare Other

## 2016-09-26 ENCOUNTER — Ambulatory Visit (INDEPENDENT_AMBULATORY_CARE_PROVIDER_SITE_OTHER): Payer: Self-pay | Admitting: Sports Medicine

## 2016-09-26 DIAGNOSIS — L97512 Non-pressure chronic ulcer of other part of right foot with fat layer exposed: Secondary | ICD-10-CM

## 2016-09-26 DIAGNOSIS — M79671 Pain in right foot: Secondary | ICD-10-CM

## 2016-09-26 DIAGNOSIS — Z89421 Acquired absence of other right toe(s): Secondary | ICD-10-CM

## 2016-09-27 NOTE — Progress Notes (Signed)
Subjective: Jose Heath is a 66 y.o. male patient seen today in office for POV #2, S/P right 5th toe amputation secondary to gangrene. Patient denies pain at surgical site, denies calf pain, denies headache, chest pain, shortness of breath, nausea, vomiting, fever, or chills. No other issues noted.   Patient Active Problem List   Diagnosis Date Noted  . Arthritis 09/17/2016  . Atrial flutter ([place]) 09/17/2016  . Coronary artery disease 09/17/2016  . Diabetes mellitus ([place]) 09/17/2016  . Disease of thyroid gland 09/17/2016  . HOH (hard of hearing) 09/17/2016  . Hyperlipidemia 09/17/2016  . ICD (implantable cardioverter-defibrillator) in place 09/17/2016  . Left foot pain 09/17/2016  . Skin cancer 09/17/2016  . TSH (thyroid-stimulating hormone deficiency) 08/13/2016  . Anasarca 07/30/2016  . Charcot foot due to diabetes mellitus ([place]) 02/13/2016  . Atypical atrial flutter ([place]) 10/04/2015  . Essential hypertension 10/04/2015  . Ischemic cardiomyopathy 10/04/2015  . Pure hypercholesterolemia 10/04/2015  . Amputated toe of left foot ([place]) 08/15/2015  . Diabetic peripheral neuropathy ([place]) 08/15/2015  . Type 2 diabetes mellitus with Charcot's joint of left foot ([place]) 08/15/2015  . Cardiac defibrillator in place 05/29/2015  . Cardiomyopathy ([place]) 05/29/2015  . Dual ICD (implantable cardioverter-defibrillator) in place 05/29/2015    Current Outpatient Prescriptions on File Prior to Visit  Medication Sig Dispense Refill  . amiodarone (PACERONE) 200 MG tablet Take 200 mg by mouth daily.  1  . apixaban (ELIQUIS) 5 MG TABS tablet Take 5 mg by mouth.    Marland Kitchen atorvastatin (LIPITOR) 40 MG tablet TAKE 1 TABLET ONCE DAILY.    Marland Kitchen atorvastatin (LIPITOR) 40 MG tablet Take 40 mg by mouth daily.  0  . carvedilol (COREG) 6.25 MG tablet Take 6.25 mg by mouth 2 (two) times daily.  1  . clindamycin (CLEOCIN) 300 MG capsule TAKE ONE CAPSULE BY MOUTH THREE TIMES DAILY UNTIL GONE  0  . ELIQUIS 2.5 MG TABS  tablet TAKE ONE TABLET BY MOUTH TWICE DAILY FOR afib  0  . furosemide (LASIX) 40 MG tablet Take 40 mg by mouth 2 (two) times daily.  2  . glimepiride (AMARYL) 2 MG tablet Take 2 mg by mouth.    Colbert Ewing 1 g injection See admin instructions.  0  . IRON PO Take 325 mg by mouth.    . levothyroxine (SYNTHROID, LEVOTHROID) 100 MCG tablet Take by mouth.    . levothyroxine (SYNTHROID, LEVOTHROID) 100 MCG tablet Take 100 mcg by mouth every morning.  6  . levothyroxine (SYNTHROID, LEVOTHROID) 88 MCG tablet Take by mouth.    . levothyroxine (SYNTHROID, LEVOTHROID) 88 MCG tablet Take 88 mcg by mouth daily.  0  . linezolid (ZYVOX) 600 MG tablet Take 1 tablet (600 mg total) by mouth 2 (two) times daily. 84 tablet 0  . losartan (COZAAR) 25 MG tablet Take 25 mg by mouth.    . losartan (COZAAR) 50 MG tablet Take 50 mg by mouth.    . metFORMIN (GLUCOPHAGE) 1000 MG tablet Take 1,000 mg by mouth 2 (two) times daily with a meal.  0  . simvastatin (ZOCOR) 20 MG tablet Take by mouth.    . spironolactone (ALDACTONE) 25 MG tablet Take 25 mg by mouth daily.  6  . torsemide (DEMADEX) 20 MG tablet TAKE 1 OR 2 TABLETS BY MOUTH EVERY DAY AS DIRECTED  6  . XYLOCAINE 1 % (with preservative) injection USE 3.43ml with invanz FOR injection  0   No current facility-administered medications on file prior  to visit.     Not on File  Objective: There were no vitals filed for this visit.  General: No acute distress, AAOx3 patient presents wearing normal shoes Right foot: Sutures proximally intact with no gapping or dehiscence at surgical site with open amputation wound measuring 5x4x0.3cm with fibrogranular base, mild swelling to right foot, no erythema, no warmth, no drainage, no acute signs of infection noted, Capillary fill time <3 seconds in all digits, gross sensation present via light touch to right foot. No pain or crepitation with range of motion right foot.  No pain with calf compression.   Xray- consistent with  amputation status. No other acute findings.  Assessment and Plan:  Problem List Items Addressed This Visit    None    Visit Diagnoses    Right foot pain    -  Primary   Relevant Orders   DG Foot 2 Views Right (Completed)   S/P amputation of lesser toe, right (HCC)       Foot ulceration, right, with fat layer exposed ([place])           -Patient seen and evaluated -Xrays reviewed  -Sutures removed  -Applied PRISMA and dry sterile dressing to surgical wound site right secured with ACE wrap and stockinet  -Nursing to continue with same dressings as above until regranex is received which was ordered today to assist with healing -Continue with Zyvox 600mg  BID x 6 weeks to end 10-29-16 -Advised patient to return to using post-op shoe on right -Advised patient to limit activity to necessity  -Advised patient to ice and elevate as necessary  -Will plan for continued post op wound care at next visit. In the meantime, patient to call office if any issues or problems arise.   [name], DPM

## 2016-09-30 MED ORDER — BECAPLERMIN 0.01 % EX GEL: CUTANEOUS | 3 refills | Status: DC

## 2016-09-30 NOTE — Telephone Encounter (Signed)
-----   Message from [name], [place] sent at 09/26/2016 10:17 AM EDT ----- Regarding: Updated nursing orders To right foot ulcer apply regranex covered with dry dressing 3 x per week. May use PRISMA until regranex is received  VAl you may have to order the Regranex for the home nurse from [place] with Tamala Julian and [place] [phone]  Dr Cannon Kettle

## 2016-10-06 NOTE — Telephone Encounter (Signed)
Ok, great. That's affordable. Dr. Chauncey Cruel

## 2016-10-07 NOTE — Telephone Encounter (Signed)
Ok thank you 

## 2016-10-07 NOTE — Telephone Encounter (Signed)
THANKS FOR THE FYI

## 2016-10-10 ENCOUNTER — Ambulatory Visit (INDEPENDENT_AMBULATORY_CARE_PROVIDER_SITE_OTHER): Payer: Medicare Other

## 2016-10-10 ENCOUNTER — Ambulatory Visit (INDEPENDENT_AMBULATORY_CARE_PROVIDER_SITE_OTHER): Payer: Self-pay | Admitting: Sports Medicine

## 2016-10-10 DIAGNOSIS — M79671 Pain in right foot: Secondary | ICD-10-CM

## 2016-10-10 DIAGNOSIS — Z89421 Acquired absence of other right toe(s): Secondary | ICD-10-CM

## 2016-10-10 DIAGNOSIS — L97512 Non-pressure chronic ulcer of other part of right foot with fat layer exposed: Secondary | ICD-10-CM

## 2016-10-11 NOTE — Progress Notes (Signed)
Subjective: Jose Heath is a 66 y.o. male patient seen today in office for POV #3, S/P right 5th toe amputation secondary to gangrene. Patient denies pain at surgical site, denies calf pain, denies headache, chest pain, shortness of breath, nausea, vomiting, fever, or chills. No other issues noted.   Patient Active Problem List   Diagnosis Date Noted  . Arthritis 09/17/2016  . Atrial flutter ([place]) 09/17/2016  . Coronary artery disease 09/17/2016  . Diabetes mellitus ([place]) 09/17/2016  . Disease of thyroid gland 09/17/2016  . HOH (hard of hearing) 09/17/2016  . Hyperlipidemia 09/17/2016  . ICD (implantable cardioverter-defibrillator) in place 09/17/2016  . Left foot pain 09/17/2016  . Skin cancer 09/17/2016  . TSH (thyroid-stimulating hormone deficiency) 08/13/2016  . Anasarca 07/30/2016  . Charcot foot due to diabetes mellitus ([place]) 02/13/2016  . Atypical atrial flutter ([place]) 10/04/2015  . Essential hypertension 10/04/2015  . Ischemic cardiomyopathy 10/04/2015  . Pure hypercholesterolemia 10/04/2015  . Amputated toe of left foot ([place]) 08/15/2015  . Diabetic peripheral neuropathy ([place]) 08/15/2015  . Type 2 diabetes mellitus with Charcot's joint of left foot ([place]) 08/15/2015  . Cardiac defibrillator in place 05/29/2015  . Cardiomyopathy ([place]) 05/29/2015  . Dual ICD (implantable cardioverter-defibrillator) in place 05/29/2015    Current Outpatient Prescriptions on File Prior to Visit  Medication Sig Dispense Refill  . amiodarone (PACERONE) 200 MG tablet Take 200 mg by mouth daily.  1  . apixaban (ELIQUIS) 5 MG TABS tablet Take 5 mg by mouth.    Marland Kitchen atorvastatin (LIPITOR) 40 MG tablet TAKE 1 TABLET ONCE DAILY.    Marland Kitchen atorvastatin (LIPITOR) 40 MG tablet Take 40 mg by mouth daily.  0  . becaplermin (REGRANEX) 0.01 % gel Apply to affected area 3 times week. 15 g 3  . carvedilol (COREG) 6.25 MG tablet Take 6.25 mg by mouth 2 (two) times daily.  1  . clindamycin (CLEOCIN) 300 MG capsule  TAKE ONE CAPSULE BY MOUTH THREE TIMES DAILY UNTIL GONE  0  . ELIQUIS 2.5 MG TABS tablet TAKE ONE TABLET BY MOUTH TWICE DAILY FOR afib  0  . furosemide (LASIX) 40 MG tablet Take 40 mg by mouth 2 (two) times daily.  2  . glimepiride (AMARYL) 2 MG tablet Take 2 mg by mouth.    Colbert Ewing 1 g injection See admin instructions.  0  . IRON PO Take 325 mg by mouth.    . levothyroxine (SYNTHROID, LEVOTHROID) 100 MCG tablet Take by mouth.    . levothyroxine (SYNTHROID, LEVOTHROID) 100 MCG tablet Take 100 mcg by mouth every morning.  6  . levothyroxine (SYNTHROID, LEVOTHROID) 88 MCG tablet Take by mouth.    . levothyroxine (SYNTHROID, LEVOTHROID) 88 MCG tablet Take 88 mcg by mouth daily.  0  . linezolid (ZYVOX) 600 MG tablet Take 1 tablet (600 mg total) by mouth 2 (two) times daily. 84 tablet 0  . Linezolid in Sodium Chloride 600-0.9 MG/300ML-% SOLN     . losartan (COZAAR) 25 MG tablet Take 25 mg by mouth.    . losartan (COZAAR) 50 MG tablet Take 50 mg by mouth.    . metFORMIN (GLUCOPHAGE) 1000 MG tablet Take 1,000 mg by mouth 2 (two) times daily with a meal.  0  . mupirocin ointment (BACTROBAN) 2 %     . simvastatin (ZOCOR) 20 MG tablet Take by mouth.    . spironolactone (ALDACTONE) 25 MG tablet Take 25 mg by mouth daily.  6  . torsemide (DEMADEX) 20 MG  tablet TAKE 1 OR 2 TABLETS BY MOUTH EVERY DAY AS DIRECTED  6  . XYLOCAINE 1 % (with preservative) injection USE 3.30ml with invanz FOR injection  0   No current facility-administered medications on file prior to visit.     Not on File  Objective: There were no vitals filed for this visit.  General: No acute distress, AAOx3 patient presents wearing normal shoes Right foot: Sutures proximally intact with no gapping or dehiscence at surgical site with open amputation wound measuring 5.5x1x0.4cm smaller than previous with fibrogranular base, mild swelling to right foot, no erythema, no warmth, no drainage, no acute signs of infection noted, Capillary  fill time <3 seconds in all digits, gross sensation present via light touch to right foot. No pain or crepitation with range of motion right foot.  No pain with calf compression.   Xray- consistent with amputation status. No other acute findings.  Assessment and Plan:  Problem List Items Addressed This Visit    None    Visit Diagnoses    S/P amputation of lesser toe, right ([place])    -  Primary   Relevant Orders   DG Foot Complete Right   Foot ulceration, right, with fat layer exposed ([place])       Right foot pain           -Patient seen and evaluated -Xray reviewed  -Applied PRISMA and dry sterile dressing to surgical wound site right secured with ACE wrap and stockinet  -Nursing to continue with same dressings as above. Patient does not want to pay for regranex at this time. -Continue with Zyvox 600mg  BID x 6 weeks to end 10-29-16 -Advised patient to return to using post-op shoe on right again  -Advised patient to limit activity to necessity  -Advised patient to ice and elevate as necessary  -Will plan for continued post op wound care at next visit. In the meantime, patient to call office if any issues or problems arise.   [name], DPM

## 2016-10-15 ENCOUNTER — Encounter: Payer: Self-pay | Admitting: Sports Medicine

## 2016-10-15 ENCOUNTER — Ambulatory Visit (INDEPENDENT_AMBULATORY_CARE_PROVIDER_SITE_OTHER): Payer: Medicare Other | Admitting: Sports Medicine

## 2016-10-15 DIAGNOSIS — L97512 Non-pressure chronic ulcer of other part of right foot with fat layer exposed: Secondary | ICD-10-CM

## 2016-10-15 DIAGNOSIS — Z89421 Acquired absence of other right toe(s): Secondary | ICD-10-CM

## 2016-10-15 NOTE — Progress Notes (Signed)
Subjective: Jose Heath is a 66 y.o. male patient seen today in office for POV #4, S/P right 5th toe amputation secondary to gangrene. Patient is here to discuss Regranex; denies pain at surgical site, denies calf pain, denies headache, chest pain, shortness of breath, nausea, vomiting, fever, or chills. No other issues noted.   Patient Active Problem List   Diagnosis Date Noted  . Arthritis 09/17/2016  . Atrial flutter ([place]) 09/17/2016  . Coronary artery disease 09/17/2016  . Diabetes mellitus ([place]) 09/17/2016  . Disease of thyroid gland 09/17/2016  . HOH (hard of hearing) 09/17/2016  . Hyperlipidemia 09/17/2016  . ICD (implantable cardioverter-defibrillator) in place 09/17/2016  . Left foot pain 09/17/2016  . Skin cancer 09/17/2016  . TSH (thyroid-stimulating hormone deficiency) 08/13/2016  . Anasarca 07/30/2016  . Charcot foot due to diabetes mellitus ([place]) 02/13/2016  . Atypical atrial flutter ([place]) 10/04/2015  . Essential hypertension 10/04/2015  . Ischemic cardiomyopathy 10/04/2015  . Pure hypercholesterolemia 10/04/2015  . Amputated toe of left foot ([place]) 08/15/2015  . Diabetic peripheral neuropathy ([place]) 08/15/2015  . Type 2 diabetes mellitus with Charcot's joint of left foot ([place]) 08/15/2015  . Cardiac defibrillator in place 05/29/2015  . Cardiomyopathy ([place]) 05/29/2015  . Dual ICD (implantable cardioverter-defibrillator) in place 05/29/2015    Current Outpatient Prescriptions on File Prior to Visit  Medication Sig Dispense Refill  . amiodarone (PACERONE) 200 MG tablet Take 200 mg by mouth daily.  1  . apixaban (ELIQUIS) 5 MG TABS tablet Take 5 mg by mouth.    Marland Kitchen atorvastatin (LIPITOR) 40 MG tablet TAKE 1 TABLET ONCE DAILY.    Marland Kitchen atorvastatin (LIPITOR) 40 MG tablet Take 40 mg by mouth daily.  0  . becaplermin (REGRANEX) 0.01 % gel Apply to affected area 3 times week. 15 g 3  . carvedilol (COREG) 6.25 MG tablet Take 6.25 mg by mouth 2 (two) times daily.  1  .  clindamycin (CLEOCIN) 300 MG capsule TAKE ONE CAPSULE BY MOUTH THREE TIMES DAILY UNTIL GONE  0  . ELIQUIS 2.5 MG TABS tablet TAKE ONE TABLET BY MOUTH TWICE DAILY FOR afib  0  . furosemide (LASIX) 40 MG tablet Take 40 mg by mouth 2 (two) times daily.  2  . glimepiride (AMARYL) 2 MG tablet Take 2 mg by mouth.    Colbert Ewing 1 g injection See admin instructions.  0  . IRON PO Take 325 mg by mouth.    . levothyroxine (SYNTHROID, LEVOTHROID) 100 MCG tablet Take by mouth.    . levothyroxine (SYNTHROID, LEVOTHROID) 100 MCG tablet Take 100 mcg by mouth every morning.  6  . levothyroxine (SYNTHROID, LEVOTHROID) 88 MCG tablet Take by mouth.    . levothyroxine (SYNTHROID, LEVOTHROID) 88 MCG tablet Take 88 mcg by mouth daily.  0  . linezolid (ZYVOX) 600 MG tablet Take 1 tablet (600 mg total) by mouth 2 (two) times daily. 84 tablet 0  . Linezolid in Sodium Chloride 600-0.9 MG/300ML-% SOLN     . losartan (COZAAR) 25 MG tablet Take 25 mg by mouth.    . losartan (COZAAR) 50 MG tablet Take 50 mg by mouth.    . metFORMIN (GLUCOPHAGE) 1000 MG tablet Take 1,000 mg by mouth 2 (two) times daily with a meal.  0  . mupirocin ointment (BACTROBAN) 2 %     . simvastatin (ZOCOR) 20 MG tablet Take by mouth.    . spironolactone (ALDACTONE) 25 MG tablet Take 25 mg by mouth daily.  6  .  torsemide (DEMADEX) 20 MG tablet TAKE 1 OR 2 TABLETS BY MOUTH EVERY DAY AS DIRECTED  6  . XYLOCAINE 1 % (with preservative) injection USE 3.47ml with invanz FOR injection  0   No current facility-administered medications on file prior to visit.     Not on File  Objective: There were no vitals filed for this visit.  General: No acute distress, AAOx3 patient presents wearing normal shoes Right foot: Surgical site with open amputation wound measuring 5.5x1x0.4cm as previous previous with fibrogranular base, mild swelling to right foot, no erythema, no warmth, no drainage, no acute signs of infection noted, Capillary fill time <3 seconds in  all digits, gross sensation present via light touch to right foot. No pain or crepitation with range of motion right foot.  No pain with calf compression.   Assessment and Plan:  Problem List Items Addressed This Visit    None    Visit Diagnoses    Foot ulceration, right, with fat layer exposed ([place])    -  Primary   S/P amputation of lesser toe, right ([place])          -Patient seen and evaluated -Applied PRISMA and dry sterile dressing to surgical wound site right secured with ACE wrap and stockinet  -Nursing to continue with same dressings as above. Patient does not want to pay for regranex at this time. -Continue with Zyvox 600mg  BID x 6 weeks to end 10-29-16 -Advised patient to return to using post-op shoe on right again as I told him last week -Advised patient to limit activity to necessity  -Advised patient to ice and elevate as necessary  -Will plan for continued post op wound care at next visit. In the meantime, patient to call office if any issues or problems arise.   [name], DPM

## 2016-10-16 NOTE — Telephone Encounter (Signed)
-----   Message from [name], [place] sent at 10/15/2016  9:39 PM EDT ----- Regarding: Regranex Patient does not want regranex does not want to pay for medication -Dr. Cannon Kettle

## 2016-10-24 ENCOUNTER — Ambulatory Visit (INDEPENDENT_AMBULATORY_CARE_PROVIDER_SITE_OTHER): Payer: Medicare Other | Admitting: Sports Medicine

## 2016-10-24 ENCOUNTER — Encounter: Payer: Self-pay | Admitting: Sports Medicine

## 2016-10-24 DIAGNOSIS — M79671 Pain in right foot: Secondary | ICD-10-CM

## 2016-10-24 DIAGNOSIS — L97512 Non-pressure chronic ulcer of other part of right foot with fat layer exposed: Secondary | ICD-10-CM | POA: Diagnosis not present

## 2016-10-24 DIAGNOSIS — Z89421 Acquired absence of other right toe(s): Secondary | ICD-10-CM

## 2016-10-24 NOTE — Progress Notes (Signed)
Subjective: Jose Heath is a 66 y.o. male patient seen today in office for POV #5, S/P right 5th toe amputation secondary to gangrene; denies pain at surgical site, denies calf pain, denies headache, chest pain, shortness of breath, nausea, vomiting, fever, or chills. Reports went to kidney doctor today and feels tired. No other issues noted.   Patient Active Problem List   Diagnosis Date Noted  . Arthritis 09/17/2016  . Atrial flutter ([place]) 09/17/2016  . Coronary artery disease 09/17/2016  . Diabetes mellitus ([place]) 09/17/2016  . Disease of thyroid gland 09/17/2016  . HOH (hard of hearing) 09/17/2016  . Hyperlipidemia 09/17/2016  . ICD (implantable cardioverter-defibrillator) in place 09/17/2016  . Left foot pain 09/17/2016  . Skin cancer 09/17/2016  . TSH (thyroid-stimulating hormone deficiency) 08/13/2016  . Anasarca 07/30/2016  . Charcot foot due to diabetes mellitus ([place]) 02/13/2016  . Atypical atrial flutter ([place]) 10/04/2015  . Essential hypertension 10/04/2015  . Ischemic cardiomyopathy 10/04/2015  . Pure hypercholesterolemia 10/04/2015  . Amputated toe of left foot ([place]) 08/15/2015  . Diabetic peripheral neuropathy ([place]) 08/15/2015  . Type 2 diabetes mellitus with Charcot's joint of left foot ([place]) 08/15/2015  . Cardiac defibrillator in place 05/29/2015  . Cardiomyopathy ([place]) 05/29/2015  . Dual ICD (implantable cardioverter-defibrillator) in place 05/29/2015    Current Outpatient Prescriptions on File Prior to Visit  Medication Sig Dispense Refill  . amiodarone (PACERONE) 200 MG tablet Take 200 mg by mouth daily.  1  . apixaban (ELIQUIS) 5 MG TABS tablet Take 5 mg by mouth.    Marland Kitchen atorvastatin (LIPITOR) 40 MG tablet TAKE 1 TABLET ONCE DAILY.    Marland Kitchen atorvastatin (LIPITOR) 40 MG tablet Take 40 mg by mouth daily.  0  . becaplermin (REGRANEX) 0.01 % gel Apply to affected area 3 times week. 15 g 3  . carvedilol (COREG) 6.25 MG tablet Take 6.25 mg by mouth 2 (two) times daily.   1  . clindamycin (CLEOCIN) 300 MG capsule TAKE ONE CAPSULE BY MOUTH THREE TIMES DAILY UNTIL GONE  0  . ELIQUIS 2.5 MG TABS tablet TAKE ONE TABLET BY MOUTH TWICE DAILY FOR afib  0  . furosemide (LASIX) 40 MG tablet Take 40 mg by mouth 2 (two) times daily.  2  . glimepiride (AMARYL) 2 MG tablet Take 2 mg by mouth.    Colbert Ewing 1 g injection See admin instructions.  0  . IRON PO Take 325 mg by mouth.    . levothyroxine (SYNTHROID, LEVOTHROID) 100 MCG tablet Take by mouth.    . levothyroxine (SYNTHROID, LEVOTHROID) 100 MCG tablet Take 100 mcg by mouth every morning.  6  . levothyroxine (SYNTHROID, LEVOTHROID) 88 MCG tablet Take by mouth.    . levothyroxine (SYNTHROID, LEVOTHROID) 88 MCG tablet Take 88 mcg by mouth daily.  0  . linezolid (ZYVOX) 600 MG tablet Take 1 tablet (600 mg total) by mouth 2 (two) times daily. 84 tablet 0  . Linezolid in Sodium Chloride 600-0.9 MG/300ML-% SOLN     . losartan (COZAAR) 25 MG tablet Take 25 mg by mouth.    . losartan (COZAAR) 50 MG tablet Take 50 mg by mouth.    . metFORMIN (GLUCOPHAGE) 1000 MG tablet Take 1,000 mg by mouth 2 (two) times daily with a meal.  0  . mupirocin ointment (BACTROBAN) 2 %     . simvastatin (ZOCOR) 20 MG tablet Take by mouth.    . spironolactone (ALDACTONE) 25 MG tablet Take 25 mg by mouth daily.  6  . torsemide (DEMADEX) 20 MG tablet TAKE 1 OR 2 TABLETS BY MOUTH EVERY DAY AS DIRECTED  6  . XYLOCAINE 1 % (with preservative) injection USE 3.84ml with invanz FOR injection  0   No current facility-administered medications on file prior to visit.     Not on File  Objective: There were no vitals filed for this visit.  General: No acute distress, AAOx3 patient presents wearing normal shoes Right foot: Surgical site with open amputation wound measuring 5.x1x0.4cm smaller than previous with fibrogranular base, mild swelling to right foot, no erythema, no warmth, no drainage, no acute signs of infection noted, Capillary fill time <3 seconds  in all digits, gross sensation present via light touch to right foot. No pain or crepitation with range of motion right foot.  No pain with calf compression.   Assessment and Plan:  Problem List Items Addressed This Visit    None    Visit Diagnoses    Foot ulceration, right, with fat layer exposed ([place])    -  Primary   S/P amputation of lesser toe, right (HCC)       Right foot pain          -Patient seen and evaluated -Applied PRISMA and dry sterile dressing to surgical wound site right secured with ACE wrap and stockinet  -Nursing to continue with same dressings as above. Patient does not want to pay for regranex at this time. -Continue with Zyvox 600mg  BID x 6 weeks to end 10-29-16 -Advised patient again to return to using post-op shoe on right however he still continues with normal shoes -Advised patient to limit activity to necessity  -Advised patient to ice and elevate as necessary  -Will plan for continued post op wound care at next visit. In the meantime, patient to call office if any issues or problems arise.   [name], DPM

## 2016-11-07 ENCOUNTER — Ambulatory Visit (INDEPENDENT_AMBULATORY_CARE_PROVIDER_SITE_OTHER): Payer: Medicare Other | Admitting: Sports Medicine

## 2016-11-07 ENCOUNTER — Encounter: Payer: Self-pay | Admitting: Sports Medicine

## 2016-11-07 DIAGNOSIS — E1169 Type 2 diabetes mellitus with other specified complication: Secondary | ICD-10-CM | POA: Diagnosis not present

## 2016-11-07 DIAGNOSIS — Z713 Dietary counseling and surveillance: Secondary | ICD-10-CM | POA: Diagnosis not present

## 2016-11-07 DIAGNOSIS — Z6828 Body mass index (BMI) 28.0-28.9, adult: Secondary | ICD-10-CM | POA: Diagnosis not present

## 2016-11-07 DIAGNOSIS — D696 Thrombocytopenia, unspecified: Secondary | ICD-10-CM | POA: Diagnosis not present

## 2016-11-07 DIAGNOSIS — R944 Abnormal results of kidney function studies: Secondary | ICD-10-CM | POA: Diagnosis not present

## 2016-11-07 DIAGNOSIS — L97512 Non-pressure chronic ulcer of other part of right foot with fat layer exposed: Secondary | ICD-10-CM

## 2016-11-07 DIAGNOSIS — M79671 Pain in right foot: Secondary | ICD-10-CM

## 2016-11-07 DIAGNOSIS — Z89421 Acquired absence of other right toe(s): Secondary | ICD-10-CM

## 2016-11-08 NOTE — Progress Notes (Signed)
Subjective: Jose Heath is a 66 y.o. male patient seen today in office for POV #6, S/P right 5th toe amputation secondary to gangrene; denies pain at surgical site, denies calf pain, denies headache, chest pain, shortness of breath, nausea, vomiting, fever, or chills. Reports had melanoma on forehead that was removed. No other issues noted.   Patient Active Problem List   Diagnosis Date Noted  . Arthritis 09/17/2016  . Atrial flutter ([place]) 09/17/2016  . Coronary artery disease 09/17/2016  . Diabetes mellitus ([place]) 09/17/2016  . Disease of thyroid gland 09/17/2016  . HOH (hard of hearing) 09/17/2016  . Hyperlipidemia 09/17/2016  . ICD (implantable cardioverter-defibrillator) in place 09/17/2016  . Left foot pain 09/17/2016  . Skin cancer 09/17/2016  . TSH (thyroid-stimulating hormone deficiency) 08/13/2016  . Anasarca 07/30/2016  . Charcot foot due to diabetes mellitus ([place]) 02/13/2016  . Atypical atrial flutter ([place]) 10/04/2015  . Essential hypertension 10/04/2015  . Ischemic cardiomyopathy 10/04/2015  . Pure hypercholesterolemia 10/04/2015  . Amputated toe of left foot ([place]) 08/15/2015  . Diabetic peripheral neuropathy ([place]) 08/15/2015  . Type 2 diabetes mellitus with Charcot's joint of left foot ([place]) 08/15/2015  . Cardiac defibrillator in place 05/29/2015  . Cardiomyopathy ([place]) 05/29/2015  . Dual ICD (implantable cardioverter-defibrillator) in place 05/29/2015    Current Outpatient Prescriptions on File Prior to Visit  Medication Sig Dispense Refill  . amiodarone (PACERONE) 200 MG tablet Take 200 mg by mouth daily.  1  . apixaban (ELIQUIS) 5 MG TABS tablet Take 5 mg by mouth.    Marland Kitchen atorvastatin (LIPITOR) 40 MG tablet TAKE 1 TABLET ONCE DAILY.    Marland Kitchen atorvastatin (LIPITOR) 40 MG tablet Take 40 mg by mouth daily.  0  . becaplermin (REGRANEX) 0.01 % gel Apply to affected area 3 times week. 15 g 3  . carvedilol (COREG) 6.25 MG tablet Take 6.25 mg by mouth 2 (two) times daily.  1   . clindamycin (CLEOCIN) 300 MG capsule TAKE ONE CAPSULE BY MOUTH THREE TIMES DAILY UNTIL GONE  0  . ELIQUIS 2.5 MG TABS tablet TAKE ONE TABLET BY MOUTH TWICE DAILY FOR afib  0  . furosemide (LASIX) 40 MG tablet Take 40 mg by mouth 2 (two) times daily.  2  . glimepiride (AMARYL) 2 MG tablet Take 2 mg by mouth.    Colbert Ewing 1 g injection See admin instructions.  0  . IRON PO Take 325 mg by mouth.    . levothyroxine (SYNTHROID, LEVOTHROID) 100 MCG tablet Take by mouth.    . levothyroxine (SYNTHROID, LEVOTHROID) 100 MCG tablet Take 100 mcg by mouth every morning.  6  . levothyroxine (SYNTHROID, LEVOTHROID) 88 MCG tablet Take by mouth.    . levothyroxine (SYNTHROID, LEVOTHROID) 88 MCG tablet Take 88 mcg by mouth daily.  0  . Linezolid in Sodium Chloride 600-0.9 MG/300ML-% SOLN     . losartan (COZAAR) 25 MG tablet Take 25 mg by mouth.    . losartan (COZAAR) 50 MG tablet Take 50 mg by mouth.    . metFORMIN (GLUCOPHAGE) 1000 MG tablet Take 1,000 mg by mouth 2 (two) times daily with a meal.  0  . mupirocin ointment (BACTROBAN) 2 %     . simvastatin (ZOCOR) 20 MG tablet Take by mouth.    . spironolactone (ALDACTONE) 25 MG tablet Take 25 mg by mouth daily.  6  . torsemide (DEMADEX) 20 MG tablet TAKE 1 OR 2 TABLETS BY MOUTH EVERY DAY AS DIRECTED  6  .  XYLOCAINE 1 % (with preservative) injection USE 3.37ml with invanz FOR injection  0   No current facility-administered medications on file prior to visit.     Not on File  Objective: There were no vitals filed for this visit.  General: No acute distress, AAOx3 patient presents wearing normal shoes Right foot: Surgical site with open amputation wound measuring 5x0.6x0.3cm smaller than previous with fibrogranular base, mild swelling to right foot, no erythema, no warmth, no drainage, no acute signs of infection noted, Capillary fill time <3 seconds in all digits, gross sensation present via light touch to right foot. No pain or crepitation with range of  motion right foot.  No pain with calf compression.   Assessment and Plan:  Problem List Items Addressed This Visit    None    Visit Diagnoses    Foot ulceration, right, with fat layer exposed ([place])    -  Primary   improving   S/P amputation of lesser toe, right (HCC)       Right foot pain          -Patient seen and evaluated -Applied PRISMA and dry sterile dressing to surgical wound site right secured with ACE wrap and stockinet  -Nursing to continue with same dressings as above.  -Advised patient to continue using post-op shoe on right -Advised patient to limit activity to necessity  -Advised patient to elevate daily -Will plan for continued post op wound care at next visit. In the meantime, patient to call office if any issues or problems arise. May try oasis at next office visit.  [name], DPM

## 2016-11-11 DIAGNOSIS — N390 Urinary tract infection, site not specified: Secondary | ICD-10-CM | POA: Diagnosis not present

## 2016-11-11 DIAGNOSIS — D631 Anemia in chronic kidney disease: Secondary | ICD-10-CM | POA: Diagnosis not present

## 2016-11-11 DIAGNOSIS — E119 Type 2 diabetes mellitus without complications: Secondary | ICD-10-CM | POA: Diagnosis not present

## 2016-11-12 NOTE — Telephone Encounter (Signed)
Ok thanks -Dr. S 

## 2016-11-13 ENCOUNTER — Ambulatory Visit (INDEPENDENT_AMBULATORY_CARE_PROVIDER_SITE_OTHER): Payer: Medicare Other | Admitting: Sports Medicine

## 2016-11-13 ENCOUNTER — Telehealth: Payer: Self-pay | Admitting: *Deleted

## 2016-11-13 ENCOUNTER — Encounter: Payer: Self-pay | Admitting: Sports Medicine

## 2016-11-13 VITALS — BP 103/46 | HR 69 | Temp 96.3°F | Resp 18

## 2016-11-13 DIAGNOSIS — L97512 Non-pressure chronic ulcer of other part of right foot with fat layer exposed: Secondary | ICD-10-CM

## 2016-11-13 DIAGNOSIS — Z89421 Acquired absence of other right toe(s): Secondary | ICD-10-CM

## 2016-11-13 DIAGNOSIS — L089 Local infection of the skin and subcutaneous tissue, unspecified: Secondary | ICD-10-CM

## 2016-11-13 DIAGNOSIS — M79671 Pain in right foot: Secondary | ICD-10-CM

## 2016-11-13 MED ORDER — CLINDAMYCIN HCL 300 MG PO CAPS: 300.0000 mg | ORAL_CAPSULE | Freq: Three times a day (TID) | ORAL | 0 refills | Status: AC

## 2016-11-13 NOTE — Progress Notes (Signed)
Subjective: Jose Heath is a 66 y.o. male patient seen today in office for POV #7, S/P right 5th toe amputation secondary to gangrene; denies pain at surgical site. However states that he became concerned when the nurse came out and saw bleeding and reports that he has medicine for his nerves, which he hasn't taken, denies calf pain, denies headache, chest pain, shortness of breath, nausea, vomiting, fever, or chills. Reports had melanoma on forehead that was removed and will get stitches out tomorrow. No other issues noted.   Patient Active Problem List   Diagnosis Date Noted  . Arthritis 09/17/2016  . Atrial flutter ([place]) 09/17/2016  . Coronary artery disease 09/17/2016  . Diabetes mellitus ([place]) 09/17/2016  . Disease of thyroid gland 09/17/2016  . HOH (hard of hearing) 09/17/2016  . Hyperlipidemia 09/17/2016  . ICD (implantable cardioverter-defibrillator) in place 09/17/2016  . Left foot pain 09/17/2016  . Skin cancer 09/17/2016  . TSH (thyroid-stimulating hormone deficiency) 08/13/2016  . Anasarca 07/30/2016  . Charcot foot due to diabetes mellitus ([place]) 02/13/2016  . Atypical atrial flutter ([place]) 10/04/2015  . Essential hypertension 10/04/2015  . Ischemic cardiomyopathy 10/04/2015  . Pure hypercholesterolemia 10/04/2015  . Amputated toe of left foot ([place]) 08/15/2015  . Diabetic peripheral neuropathy ([place]) 08/15/2015  . Type 2 diabetes mellitus with Charcot's joint of left foot ([place]) 08/15/2015  . Cardiac defibrillator in place 05/29/2015  . Cardiomyopathy ([place]) 05/29/2015  . Dual ICD (implantable cardioverter-defibrillator) in place 05/29/2015    Current Outpatient Prescriptions on File Prior to Visit  Medication Sig Dispense Refill  . amiodarone (PACERONE) 200 MG tablet Take 200 mg by mouth daily.  1  . apixaban (ELIQUIS) 5 MG TABS tablet Take 5 mg by mouth.    Marland Kitchen atorvastatin (LIPITOR) 40 MG tablet TAKE 1 TABLET ONCE DAILY.    Marland Kitchen atorvastatin (LIPITOR) 40 MG tablet Take 40  mg by mouth daily.  0  . becaplermin (REGRANEX) 0.01 % gel Apply to affected area 3 times week. 15 g 3  . carvedilol (COREG) 6.25 MG tablet Take 6.25 mg by mouth 2 (two) times daily.  1  . ELIQUIS 2.5 MG TABS tablet TAKE ONE TABLET BY MOUTH TWICE DAILY FOR afib  0  . furosemide (LASIX) 40 MG tablet Take 40 mg by mouth 2 (two) times daily.  2  . glimepiride (AMARYL) 2 MG tablet Take 2 mg by mouth.    Colbert Ewing 1 g injection See admin instructions.  0  . IRON PO Take 325 mg by mouth.    . levothyroxine (SYNTHROID, LEVOTHROID) 100 MCG tablet Take by mouth.    . levothyroxine (SYNTHROID, LEVOTHROID) 100 MCG tablet Take 100 mcg by mouth every morning.  6  . levothyroxine (SYNTHROID, LEVOTHROID) 88 MCG tablet Take by mouth.    . levothyroxine (SYNTHROID, LEVOTHROID) 88 MCG tablet Take 88 mcg by mouth daily.  0  . Linezolid in Sodium Chloride 600-0.9 MG/300ML-% SOLN     . losartan (COZAAR) 25 MG tablet Take 25 mg by mouth.    . losartan (COZAAR) 50 MG tablet Take 50 mg by mouth.    . metFORMIN (GLUCOPHAGE) 1000 MG tablet Take 1,000 mg by mouth 2 (two) times daily with a meal.  0  . mupirocin ointment (BACTROBAN) 2 %     . simvastatin (ZOCOR) 20 MG tablet Take by mouth.    . spironolactone (ALDACTONE) 25 MG tablet Take 25 mg by mouth daily.  6  . torsemide (DEMADEX) 20 MG tablet  TAKE 1 OR 2 TABLETS BY MOUTH EVERY DAY AS DIRECTED  6  . XYLOCAINE 1 % (with preservative) injection USE 3.54ml with invanz FOR injection  0   No current facility-administered medications on file prior to visit.     Not on File  Objective: There were no vitals filed for this visit.  General: No acute distress, AAOx3 patient presents wearing normal shoes Right foot: Surgical site with open amputation wound measuring 5x0.8x0.3cm Similar to previous with fibrogranular base, mild swelling to right foot, mild focal erythema, no warmth, clear drainage, no other acute signs of infection noted, Capillary fill time <3 seconds in  all digits, gross sensation present via light touch to right foot. No pain or crepitation with range of motion right foot.  No pain with calf compression.   Assessment and Plan:  Problem List Items Addressed This Visit    None    Visit Diagnoses    Foot ulceration, right, with fat layer exposed ([place])    -  Primary   S/P amputation of lesser toe, right (HCC)       Right foot pain       Right foot infection       Relevant Medications   clindamycin (CLEOCIN) 300 MG capsule      -Patient seen and evaluated -Wound culture obtained and sent to [facility] -Started patient on clindamycin. We'll possibly change antibiotic pending culture results -Applied betadine and dry sterile dressing to surgical wound site right secured with ACE wrap and stockinet  -Nursing to continue with same dressings as above.  -Advised patient to continue using post-op shoe on right -Advised patient to limit activity to necessity  -Advised patient to elevate daily -Will plan for continued post op wound care at next visit. In the meantime, patient to call office if any issues or problems arise.   [name], DPM

## 2016-11-13 NOTE — Telephone Encounter (Addendum)
-----   Message from [name], [place] sent at 11/13/2016 12:21 PM EDT ----- Regarding: [facility] to change to Iodosorb to wounds on right foot  I also restarted patient back on antibiotics and advise patient if foot worsens to go to Er -Dr. Cannon Kettle.

## 2016-11-13 NOTE — Telephone Encounter (Addendum)
-----   Message from [name], [place] sent at 11/13/2016 12:21 PM EDT ----- Regarding: [facility] nursing to change to Iodosorb to wounds on right foot  I also restarted patient back on antibiotics and advise patient if foot worsens to go to Er -Dr. Cannon Kettle

## 2016-11-19 MED ORDER — DOXYCYCLINE HYCLATE 100 MG PO CAPS: 100.0000 mg | ORAL_CAPSULE | Freq: Two times a day (BID) | ORAL | 0 refills | Status: DC

## 2016-11-19 MED ORDER — LEVOFLOXACIN 500 MG PO TABS: 500.0000 mg | ORAL_TABLET | Freq: Every day | ORAL | 0 refills | Status: DC

## 2016-11-19 NOTE — Telephone Encounter (Addendum)
-----   Message from [name], [place] sent at 11/19/2016  9:30 AM EDT ----- Regarding: Send Antibiotic to pharmacy Send to pharmacy doxycyline 100 mg bid and levaquin 500 mg po daily x 14 days for positive wound culture  Thanks Dr Cannon Kettle. Orders sent to [facility].

## 2016-11-19 NOTE — Telephone Encounter (Signed)
Ok. Betadine or Iodosorb covered with dry dressing 3x per week Dr. Cannon Kettle

## 2016-11-21 ENCOUNTER — Telehealth: Payer: Self-pay | Admitting: *Deleted

## 2016-11-21 ENCOUNTER — Ambulatory Visit (INDEPENDENT_AMBULATORY_CARE_PROVIDER_SITE_OTHER): Payer: Self-pay | Admitting: Sports Medicine

## 2016-11-21 ENCOUNTER — Encounter: Payer: Self-pay | Admitting: Sports Medicine

## 2016-11-21 DIAGNOSIS — L97512 Non-pressure chronic ulcer of other part of right foot with fat layer exposed: Secondary | ICD-10-CM

## 2016-11-21 DIAGNOSIS — I83029 Varicose veins of left lower extremity with ulcer of unspecified site: Secondary | ICD-10-CM

## 2016-11-21 DIAGNOSIS — I739 Peripheral vascular disease, unspecified: Secondary | ICD-10-CM

## 2016-11-21 DIAGNOSIS — E1142 Type 2 diabetes mellitus with diabetic polyneuropathy: Secondary | ICD-10-CM

## 2016-11-21 DIAGNOSIS — M79605 Pain in left leg: Secondary | ICD-10-CM

## 2016-11-21 DIAGNOSIS — L97929 Non-pressure chronic ulcer of unspecified part of left lower leg with unspecified severity: Secondary | ICD-10-CM

## 2016-11-21 DIAGNOSIS — Z89421 Acquired absence of other right toe(s): Secondary | ICD-10-CM

## 2016-11-21 DIAGNOSIS — M79671 Pain in right foot: Secondary | ICD-10-CM

## 2016-11-21 NOTE — Telephone Encounter (Addendum)
-----   Message from [place], [place] sent at 11/21/2016  2:41 PM EDT ----- Regarding: Nursing orders Betadine to right foot ulcerations and to left anterior shin ulceration apply bacitracin and adaptic covered with dry dressing and ACE wrap -Dr Chauncey Cruel. Orders to [facility].

## 2016-11-22 NOTE — Progress Notes (Signed)
Subjective: Jose Heath is a 66 y.o. Diabetic male patient seen today in office for POV #8, S/P right 5th toe amputation secondary to gangrene; denies pain at surgical site. States that antibiotics he started them have helped with the pain and swelling in his legs, denies calf pain, denies headache, chest pain, shortness of breath, nausea, vomiting, fever, or chills. Reports a new ulceration on his left leg but does not know how he got it. No other issues noted.   Patient Active Problem List   Diagnosis Date Noted  . Arthritis 09/17/2016  . Atrial flutter ([place]) 09/17/2016  . Coronary artery disease 09/17/2016  . Diabetes mellitus ([place]) 09/17/2016  . Disease of thyroid gland 09/17/2016  . HOH (hard of hearing) 09/17/2016  . Hyperlipidemia 09/17/2016  . ICD (implantable cardioverter-defibrillator) in place 09/17/2016  . Left foot pain 09/17/2016  . Skin cancer 09/17/2016  . TSH (thyroid-stimulating hormone deficiency) 08/13/2016  . Anasarca 07/30/2016  . Charcot foot due to diabetes mellitus ([place]) 02/13/2016  . Atypical atrial flutter ([place]) 10/04/2015  . Essential hypertension 10/04/2015  . Ischemic cardiomyopathy 10/04/2015  . Pure hypercholesterolemia 10/04/2015  . Amputated toe of left foot ([place]) 08/15/2015  . Diabetic peripheral neuropathy ([place]) 08/15/2015  . Type 2 diabetes mellitus with Charcot's joint of left foot ([place]) 08/15/2015  . Cardiac defibrillator in place 05/29/2015  . Cardiomyopathy ([place]) 05/29/2015  . Dual ICD (implantable cardioverter-defibrillator) in place 05/29/2015    Current Outpatient Prescriptions on File Prior to Visit  Medication Sig Dispense Refill  . amiodarone (PACERONE) 200 MG tablet Take 200 mg by mouth daily.  1  . apixaban (ELIQUIS) 5 MG TABS tablet Take 5 mg by mouth.    Marland Kitchen atorvastatin (LIPITOR) 40 MG tablet TAKE 1 TABLET ONCE DAILY.    Marland Kitchen atorvastatin (LIPITOR) 40 MG tablet Take 40 mg by mouth daily.  0  . becaplermin (REGRANEX) 0.01 % gel  Apply to affected area 3 times week. 15 g 3  . carvedilol (COREG) 6.25 MG tablet Take 6.25 mg by mouth 2 (two) times daily.  1  . clindamycin (CLEOCIN) 300 MG capsule Take 1 capsule (300 mg total) by mouth 3 (three) times daily. 30 capsule 0  . doxycycline (VIBRAMYCIN) 100 MG capsule Take 1 capsule (100 mg total) by mouth 2 (two) times daily. 28 capsule 0  . ELIQUIS 2.5 MG TABS tablet TAKE ONE TABLET BY MOUTH TWICE DAILY FOR afib  0  . furosemide (LASIX) 40 MG tablet Take 40 mg by mouth 2 (two) times daily.  2  . glimepiride (AMARYL) 2 MG tablet Take 2 mg by mouth.    Colbert Ewing 1 g injection See admin instructions.  0  . IRON PO Take 325 mg by mouth.    . levofloxacin (LEVAQUIN) 500 MG tablet Take 1 tablet (500 mg total) by mouth daily. 14 tablet 0  . levothyroxine (SYNTHROID, LEVOTHROID) 100 MCG tablet Take by mouth.    . levothyroxine (SYNTHROID, LEVOTHROID) 100 MCG tablet Take 100 mcg by mouth every morning.  6  . levothyroxine (SYNTHROID, LEVOTHROID) 88 MCG tablet Take by mouth.    . levothyroxine (SYNTHROID, LEVOTHROID) 88 MCG tablet Take 88 mcg by mouth daily.  0  . Linezolid in Sodium Chloride 600-0.9 MG/300ML-% SOLN     . losartan (COZAAR) 25 MG tablet Take 25 mg by mouth.    . losartan (COZAAR) 50 MG tablet Take 50 mg by mouth.    . metFORMIN (GLUCOPHAGE) 1000 MG tablet Take 1,000 mg  by mouth 2 (two) times daily with a meal.  0  . mupirocin ointment (BACTROBAN) 2 %     . simvastatin (ZOCOR) 20 MG tablet Take by mouth.    . spironolactone (ALDACTONE) 25 MG tablet Take 25 mg by mouth daily.  6  . torsemide (DEMADEX) 20 MG tablet TAKE 1 OR 2 TABLETS BY MOUTH EVERY DAY AS DIRECTED  6  . XYLOCAINE 1 % (with preservative) injection USE 3.3m with invanz FOR injection  0   No current facility-administered medications on file prior to visit.     Not on File  Objective: There were no vitals filed for this visit.  General: No acute distress, AAOx3 patient presents wearing post op shoe  on right and with no dressings and dirt on bottom of both feet  Right foot, Surgical site with open amputation wound measuring 4x0.5x0.3cm Smaller than previous with fibrogranular base, mild swelling to right foot, mild focal erythema, no warmth, decreased clear drainage, Sub met 1 ulceration that measures 1x0.7x0.2cm with  no other acute signs of infection noted, To Left anterior skin there is a venous leg ulceration that has a 10:90 fibrogranular base that measures 5x3cm with no acute signs of infection however bilateral there is +1 pitting edema, Capillary fill time <3 seconds in all remaining digits, gross sensation present via light touch bilateral. No pain or crepitation with range of motion right or left foot.  No pain with calf compression.   Assessment and Plan:  Problem List Items Addressed This Visit    None    Visit Diagnoses    Foot ulceration, right, with fat layer exposed ([place]    -  Primary   S/P amputation of lesser toe, right (HCC)       Right foot pain       Venous ulcer of left leg (HCC)       Left leg pain       PVD (peripheral vascular disease) ([place]       Diabetic polyneuropathy associated with type 2 diabetes mellitus ([place]          -Patient seen and evaluated -Continue with Doxycycline and Levaquin for + wound culture -Applied betadine and dry sterile dressing to surgical wound site  And sub met 1 on right secured with ACE wrap and stockinet and to left applied antibiotic cream and adaptic secured with ACE wrap and stockinet.  -Nursing to continue with same dressings as above.  -Advised patient to continue using post-op shoe on right -Advised patient to limit activity to necessity  -Advised patient to elevate daily -Will plan for continued wound care at next visit. In the meantime, patient to call office if any issues or problems arise. Will consider graft or oasis once he has completed antibiotics.  TLandis Martins DPM

## 2016-12-10 ENCOUNTER — Encounter: Payer: Self-pay | Admitting: Sports Medicine

## 2016-12-10 ENCOUNTER — Ambulatory Visit (INDEPENDENT_AMBULATORY_CARE_PROVIDER_SITE_OTHER): Payer: Medicare HMO | Admitting: Sports Medicine

## 2016-12-10 VITALS — BP 130/52 | HR 56 | Temp 97.0°F

## 2016-12-10 DIAGNOSIS — L97512 Non-pressure chronic ulcer of other part of right foot with fat layer exposed: Secondary | ICD-10-CM

## 2016-12-10 DIAGNOSIS — M79671 Pain in right foot: Secondary | ICD-10-CM

## 2016-12-10 DIAGNOSIS — L97929 Non-pressure chronic ulcer of unspecified part of left lower leg with unspecified severity: Secondary | ICD-10-CM

## 2016-12-10 DIAGNOSIS — I83029 Varicose veins of left lower extremity with ulcer of unspecified site: Secondary | ICD-10-CM | POA: Diagnosis not present

## 2016-12-10 DIAGNOSIS — Z89421 Acquired absence of other right toe(s): Secondary | ICD-10-CM | POA: Diagnosis not present

## 2016-12-10 NOTE — Progress Notes (Signed)
Subjective: Jose Heath is a 66 y.o. Diabetic male patient seen today in office for POV #9,  S/P right 5th toe amputation secondary to gangrene; denies pain at surgical site. States that he finished the antibiotics and has been applying wraps to legs with improvement and betadine and destin to right foot ulcers, denies calf pain, denies headache, chest pain, shortness of breath, nausea, vomiting, fever, or chills. No other issues noted.   Patient Active Problem List   Diagnosis Date Noted  . Arthritis 09/17/2016  . Atrial flutter ([place]) 09/17/2016  . Coronary artery disease 09/17/2016  . Diabetes mellitus ([place]) 09/17/2016  . Disease of thyroid gland 09/17/2016  . HOH (hard of hearing) 09/17/2016  . Hyperlipidemia 09/17/2016  . ICD (implantable cardioverter-defibrillator) in place 09/17/2016  . Left foot pain 09/17/2016  . Skin cancer 09/17/2016  . TSH (thyroid-stimulating hormone deficiency) 08/13/2016  . Anasarca 07/30/2016  . Charcot foot due to diabetes mellitus ([employer]) 02/13/2016  . Atypical atrial flutter ([place]) 10/04/2015  . Essential hypertension 10/04/2015  . Ischemic cardiomyopathy 10/04/2015  . Pure hypercholesterolemia 10/04/2015  . Amputated toe of left foot ([place]) 08/15/2015  . Diabetic peripheral neuropathy ([place]) 08/15/2015  . Type 2 diabetes mellitus with Charcot's joint of left foot ([place]) 08/15/2015  . Cardiac defibrillator in place 05/29/2015  . Cardiomyopathy ([place]) 05/29/2015  . Dual ICD (implantable cardioverter-defibrillator) in place 05/29/2015    Current Outpatient Prescriptions on File Prior to Visit  Medication Sig Dispense Refill  . amiodarone (PACERONE) 200 MG tablet Take 200 mg by mouth daily.  1  . apixaban (ELIQUIS) 5 MG TABS tablet Take 5 mg by mouth.    Marland Kitchen atorvastatin (LIPITOR) 40 MG tablet TAKE 1 TABLET ONCE DAILY.    Marland Kitchen atorvastatin (LIPITOR) 40 MG tablet Take 40 mg by mouth daily.  0  . becaplermin (REGRANEX) 0.01 % gel Apply to affected area 3 times  week. 15 g 3  . carvedilol (COREG) 6.25 MG tablet Take 6.25 mg by mouth 2 (two) times daily.  1  . doxycycline (VIBRAMYCIN) 100 MG capsule Take 1 capsule (100 mg total) by mouth 2 (two) times daily. 28 capsule 0  . ELIQUIS 2.5 MG TABS tablet TAKE ONE TABLET BY MOUTH TWICE DAILY FOR afib  0  . furosemide (LASIX) 40 MG tablet Take 40 mg by mouth 2 (two) times daily.  2  . glimepiride (AMARYL) 2 MG tablet Take 2 mg by mouth.    Colbert Ewing 1 g injection See admin instructions.  0  . IRON PO Take 325 mg by mouth.    . levofloxacin (LEVAQUIN) 500 MG tablet Take 1 tablet (500 mg total) by mouth daily. 14 tablet 0  . levothyroxine (SYNTHROID, LEVOTHROID) 100 MCG tablet Take by mouth.    . levothyroxine (SYNTHROID, LEVOTHROID) 100 MCG tablet Take 100 mcg by mouth every morning.  6  . levothyroxine (SYNTHROID, LEVOTHROID) 88 MCG tablet Take by mouth.    . levothyroxine (SYNTHROID, LEVOTHROID) 88 MCG tablet Take 88 mcg by mouth daily.  0  . Linezolid in Sodium Chloride 600-0.9 MG/300ML-% SOLN     . losartan (COZAAR) 25 MG tablet Take 25 mg by mouth.    . losartan (COZAAR) 50 MG tablet Take 50 mg by mouth.    . metFORMIN (GLUCOPHAGE) 1000 MG tablet Take 1,000 mg by mouth 2 (two) times daily with a meal.  0  . mupirocin ointment (BACTROBAN) 2 %     . simvastatin (ZOCOR) 20 MG tablet Take by  mouth.    . spironolactone (ALDACTONE) 25 MG tablet Take 25 mg by mouth daily.  6  . torsemide (DEMADEX) 20 MG tablet TAKE 1 OR 2 TABLETS BY MOUTH EVERY DAY AS DIRECTED  6  . XYLOCAINE 1 % (with preservative) injection USE 3.41m with invanz FOR injection  0   No current facility-administered medications on file prior to visit.     Not on File  Objective: There were no vitals filed for this visit.  General: No acute distress, AAOx3 patient presents wearing post op shoe on right and with no dressings and dirt on bottom of both feet  Right foot, Surgical site with open amputation wound measuring 3x0.5x0.3cm Smaller  than previous with fibrogranular base, mild swelling to right foot, no focal erythema, no warmth, no drainage, Sub met 1 ulceration that measures 1x0.4x0.2cm with  no other acute signs of infection noted, To Left anterior skin there is a venous leg ulceration that has a 10:90 fibrogranular base that measures 4x3cm with no acute signs of infection however bilateral there is +1 pitting edema that is improved, Capillary fill time <3 seconds in all remaining digits, gross sensation present via light touch bilateral. No pain or crepitation with range of motion right or left foot.  No pain with calf compression.   Assessment and Plan:  Problem List Items Addressed This Visit    None    Visit Diagnoses    S/P amputation of lesser toe, right (HCC)    -  Primary   Foot ulceration, right, with fat layer exposed ([place]       Right foot pain       Venous ulcer of left leg ([place]          -Patient seen and evaluated -Applied betadine and dry sterile dressing to surgical wound site  And sub met 1 on right secured with ACE wrap and stockinet and to left applied antibiotic cream and adaptic secured with ACE wrap and stockinet.  -Nursing to continue with same dressings as above or patient with assistance.  -Advised patient to continue using post-op shoe on right -Advised patient to limit activity to necessity  -Advised patient to elevate daily -Will plan for continued wound care at next visit. In the meantime, patient to call office if any issues or problems arise. Will consider graft or oasis if fails to continue to heal.  TLandis Martins DPM

## 2017-01-02 ENCOUNTER — Ambulatory Visit (INDEPENDENT_AMBULATORY_CARE_PROVIDER_SITE_OTHER): Payer: Medicare HMO | Admitting: Sports Medicine

## 2017-01-02 DIAGNOSIS — L97929 Non-pressure chronic ulcer of unspecified part of left lower leg with unspecified severity: Secondary | ICD-10-CM

## 2017-01-02 DIAGNOSIS — I83029 Varicose veins of left lower extremity with ulcer of unspecified site: Secondary | ICD-10-CM

## 2017-01-02 DIAGNOSIS — L97511 Non-pressure chronic ulcer of other part of right foot limited to breakdown of skin: Secondary | ICD-10-CM | POA: Diagnosis not present

## 2017-01-02 DIAGNOSIS — E1142 Type 2 diabetes mellitus with diabetic polyneuropathy: Secondary | ICD-10-CM | POA: Diagnosis not present

## 2017-01-02 DIAGNOSIS — Z89421 Acquired absence of other right toe(s): Secondary | ICD-10-CM

## 2017-01-02 DIAGNOSIS — I739 Peripheral vascular disease, unspecified: Secondary | ICD-10-CM

## 2017-01-02 DIAGNOSIS — M79671 Pain in right foot: Secondary | ICD-10-CM

## 2017-01-02 NOTE — Progress Notes (Signed)
Subjective: Jose Heath is a 66 y.o. Diabetic male patient seen today in office for POV #10,  S/P right 5th toe amputation secondary to gangrene; denies pain at surgical site. States that his legs were doing good. When he was applying wraps However, has forgotten to do so and the ulcer has brought back open on the left leg. Patient has been using betadine and destin to right foot ulcers with improvement, denies calf pain, denies headache, chest pain, shortness of breath, nausea, vomiting, fever, or chills. No other issues noted.   Patient Active Problem List   Diagnosis Date Noted  . Arthritis 09/17/2016  . Atrial flutter ([place]) 09/17/2016  . Coronary artery disease 09/17/2016  . Diabetes mellitus ([place]) 09/17/2016  . Disease of thyroid gland 09/17/2016  . HOH (hard of hearing) 09/17/2016  . Hyperlipidemia 09/17/2016  . ICD (implantable cardioverter-defibrillator) in place 09/17/2016  . Left foot pain 09/17/2016  . Skin cancer 09/17/2016  . TSH (thyroid-stimulating hormone deficiency) 08/13/2016  . Anasarca 07/30/2016  . Charcot foot due to diabetes mellitus ([place]) 02/13/2016  . Atypical atrial flutter ([place]) 10/04/2015  . Essential hypertension 10/04/2015  . Ischemic cardiomyopathy 10/04/2015  . Pure hypercholesterolemia 10/04/2015  . Amputated toe of left foot ([place]) 08/15/2015  . Diabetic peripheral neuropathy ([place]) 08/15/2015  . Type 2 diabetes mellitus with Charcot's joint of left foot ([place]) 08/15/2015  . Cardiac defibrillator in place 05/29/2015  . Cardiomyopathy ([place]) 05/29/2015  . Dual ICD (implantable cardioverter-defibrillator) in place 05/29/2015    Current Outpatient Prescriptions on File Prior to Visit  Medication Sig Dispense Refill  . amiodarone (PACERONE) 200 MG tablet Take 200 mg by mouth daily.  1  . apixaban (ELIQUIS) 5 MG TABS tablet Take 5 mg by mouth.    Marland Kitchen atorvastatin (LIPITOR) 40 MG tablet TAKE 1 TABLET ONCE DAILY.    Marland Kitchen atorvastatin (LIPITOR) 40 MG tablet Take  40 mg by mouth daily.  0  . becaplermin (REGRANEX) 0.01 % gel Apply to affected area 3 times week. 15 g 3  . carvedilol (COREG) 6.25 MG tablet Take 6.25 mg by mouth 2 (two) times daily.  1  . doxycycline (VIBRAMYCIN) 100 MG capsule Take 1 capsule (100 mg total) by mouth 2 (two) times daily. 28 capsule 0  . ELIQUIS 2.5 MG TABS tablet TAKE ONE TABLET BY MOUTH TWICE DAILY FOR afib  0  . furosemide (LASIX) 40 MG tablet Take 40 mg by mouth 2 (two) times daily.  2  . glimepiride (AMARYL) 2 MG tablet Take 2 mg by mouth.    Colbert Ewing 1 g injection See admin instructions.  0  . IRON PO Take 325 mg by mouth.    . levofloxacin (LEVAQUIN) 500 MG tablet Take 1 tablet (500 mg total) by mouth daily. 14 tablet 0  . levothyroxine (SYNTHROID, LEVOTHROID) 100 MCG tablet Take by mouth.    . levothyroxine (SYNTHROID, LEVOTHROID) 100 MCG tablet Take 100 mcg by mouth every morning.  6  . levothyroxine (SYNTHROID, LEVOTHROID) 88 MCG tablet Take by mouth.    . levothyroxine (SYNTHROID, LEVOTHROID) 88 MCG tablet Take 88 mcg by mouth daily.  0  . Linezolid in Sodium Chloride 600-0.9 MG/300ML-% SOLN     . losartan (COZAAR) 25 MG tablet Take 25 mg by mouth.    . losartan (COZAAR) 50 MG tablet Take 50 mg by mouth.    . metFORMIN (GLUCOPHAGE) 1000 MG tablet Take 1,000 mg by mouth 2 (two) times daily with a meal.  0  .  mupirocin ointment (BACTROBAN) 2 %     . simvastatin (ZOCOR) 20 MG tablet Take by mouth.    . spironolactone (ALDACTONE) 25 MG tablet Take 25 mg by mouth daily.  6  . torsemide (DEMADEX) 20 MG tablet TAKE 1 OR 2 TABLETS BY MOUTH EVERY DAY AS DIRECTED  6  . XYLOCAINE 1 % (with preservative) injection USE 3.48m with invanz FOR injection  0   No current facility-administered medications on file prior to visit.     Not on File  Objective: There were no vitals filed for this visit.  General: No acute distress, AAOx3 patient presents wearing post op shoe on right and with no dressings and dirt on bottom of  both feet  Right foot, Surgical site with open amputation wound measuring 1x0.5x0.3cm Smaller than previous with fibrogranular base and surrounding dry scabbing, mild swelling to right foot, no focal erythema, no warmth, no drainage, Sub met 1 ulceration that measures 0.6x0.4x0.2cm with  no other acute signs of infection noted, To Left anterior skin there is a venous leg ulceration that has a 10:90 fibrogranular base that measures 2x3cm smaller than previous with no acute signs of infection however bilateral there is +1 pitting edema that is improved, Capillary fill time <3 seconds in all remaining digits, gross sensation present via light touch bilateral. No pain or crepitation with range of motion right or left foot.  No pain with calf compression.   Assessment and Plan:  Problem List Items Addressed This Visit    None    Visit Diagnoses    S/P amputation of lesser toe, right (HCC)    -  Primary   Right foot ulcer, limited to breakdown of skin ([facility]       Right foot pain       Venous ulcer of left leg (HCC)       PVD (peripheral vascular disease) ([place]       Diabetic polyneuropathy associated with type 2 diabetes mellitus ([place]          -Patient seen and evaluated -Excisionally debrided using a sterile chisel blade. Ulceration, right, sub-met one to healthy bleeding. Applied betadine and dry sterile dressing to surgical wound site  And sub met 1 on right secured with ACE wrap and stockinet and to left applied antibiotic cream and adaptic secured with ACE wrap and stockinet.  -Advised patient to continue with same -Advised patient to continue using post-op shoe on right. However, he comes to office today and was normal shoes -Advised patient to limit activity to necessity  -Advised patient to elevate daily -Will plan for continued wound care at next visit. In the meantime, patient to call office if any issues or problems arise. Will consider graft or oasis if fails to continue to heal.  However, we will hold off on this time because areas are slowly healing.  TLandis Martins DPM

## 2017-01-12 DIAGNOSIS — L304 Erythema intertrigo: Secondary | ICD-10-CM | POA: Diagnosis not present

## 2017-01-12 DIAGNOSIS — N471 Phimosis: Secondary | ICD-10-CM | POA: Diagnosis not present

## 2017-01-20 DIAGNOSIS — N471 Phimosis: Secondary | ICD-10-CM | POA: Diagnosis not present

## 2017-01-20 DIAGNOSIS — N481 Balanitis: Secondary | ICD-10-CM | POA: Diagnosis not present

## 2017-01-20 DIAGNOSIS — N401 Enlarged prostate with lower urinary tract symptoms: Secondary | ICD-10-CM | POA: Diagnosis not present

## 2017-02-04 ENCOUNTER — Ambulatory Visit (INDEPENDENT_AMBULATORY_CARE_PROVIDER_SITE_OTHER): Payer: Medicare HMO | Admitting: Sports Medicine

## 2017-02-04 DIAGNOSIS — L97511 Non-pressure chronic ulcer of other part of right foot limited to breakdown of skin: Secondary | ICD-10-CM

## 2017-02-04 DIAGNOSIS — I739 Peripheral vascular disease, unspecified: Secondary | ICD-10-CM

## 2017-02-04 DIAGNOSIS — E1142 Type 2 diabetes mellitus with diabetic polyneuropathy: Secondary | ICD-10-CM

## 2017-02-04 DIAGNOSIS — Z89421 Acquired absence of other right toe(s): Secondary | ICD-10-CM

## 2017-02-04 NOTE — Progress Notes (Signed)
Subjective: Jose Heath is a 66 y.o. Diabetic male patient seen today in office for POV #11,  S/P right 5th toe amputation secondary to gangrene; denies pain at surgical site. States that the areas have healed over and has not been putting any dressing on it for several weeks now, denies calf pain, denies headache, chest pain, shortness of breath, nausea, vomiting, fever, or chills. No other issues noted.   Patient Active Problem List   Diagnosis Date Noted  . Arthritis 09/17/2016  . Atrial flutter ([place]) 09/17/2016  . Coronary artery disease 09/17/2016  . Diabetes mellitus ([place]) 09/17/2016  . Disease of thyroid gland 09/17/2016  . HOH (hard of hearing) 09/17/2016  . Hyperlipidemia 09/17/2016  . ICD (implantable cardioverter-defibrillator) in place 09/17/2016  . Left foot pain 09/17/2016  . Skin cancer 09/17/2016  . TSH (thyroid-stimulating hormone deficiency) 08/13/2016  . Anasarca 07/30/2016  . Charcot foot due to diabetes mellitus ([place]) 02/13/2016  . Atypical atrial flutter ([place]) 10/04/2015  . Essential hypertension 10/04/2015  . Ischemic cardiomyopathy 10/04/2015  . Pure hypercholesterolemia 10/04/2015  . Amputated toe of left foot ([place]) 08/15/2015  . Diabetic peripheral neuropathy ([place]) 08/15/2015  . Type 2 diabetes mellitus with Charcot's joint of left foot ([place]) 08/15/2015  . Cardiac defibrillator in place 05/29/2015  . Cardiomyopathy ([place]) 05/29/2015  . Dual ICD (implantable cardioverter-defibrillator) in place 05/29/2015    Current Outpatient Prescriptions on File Prior to Visit  Medication Sig Dispense Refill  . amiodarone (PACERONE) 200 MG tablet Take 200 mg by mouth daily.  1  . apixaban (ELIQUIS) 5 MG TABS tablet Take 5 mg by mouth.    Marland Kitchen atorvastatin (LIPITOR) 40 MG tablet TAKE 1 TABLET ONCE DAILY.    Marland Kitchen atorvastatin (LIPITOR) 40 MG tablet Take 40 mg by mouth daily.  0  . becaplermin (REGRANEX) 0.01 % gel Apply to affected area 3 times week. 15 g 3  . carvedilol  (COREG) 6.25 MG tablet Take 6.25 mg by mouth 2 (two) times daily.  1  . doxycycline (VIBRAMYCIN) 100 MG capsule Take 1 capsule (100 mg total) by mouth 2 (two) times daily. 28 capsule 0  . ELIQUIS 2.5 MG TABS tablet TAKE ONE TABLET BY MOUTH TWICE DAILY FOR afib  0  . furosemide (LASIX) 40 MG tablet Take 40 mg by mouth 2 (two) times daily.  2  . glimepiride (AMARYL) 2 MG tablet Take 2 mg by mouth.    Colbert Ewing 1 g injection See admin instructions.  0  . IRON PO Take 325 mg by mouth.    . levofloxacin (LEVAQUIN) 500 MG tablet Take 1 tablet (500 mg total) by mouth daily. 14 tablet 0  . levothyroxine (SYNTHROID, LEVOTHROID) 100 MCG tablet Take by mouth.    . levothyroxine (SYNTHROID, LEVOTHROID) 100 MCG tablet Take 100 mcg by mouth every morning.  6  . levothyroxine (SYNTHROID, LEVOTHROID) 88 MCG tablet Take by mouth.    . levothyroxine (SYNTHROID, LEVOTHROID) 88 MCG tablet Take 88 mcg by mouth daily.  0  . Linezolid in Sodium Chloride 600-0.9 MG/300ML-% SOLN     . losartan (COZAAR) 25 MG tablet Take 25 mg by mouth.    . losartan (COZAAR) 50 MG tablet Take 50 mg by mouth.    . metFORMIN (GLUCOPHAGE) 1000 MG tablet Take 1,000 mg by mouth 2 (two) times daily with a meal.  0  . mupirocin ointment (BACTROBAN) 2 %     . simvastatin (ZOCOR) 20 MG tablet Take by mouth.    Marland Kitchen  spironolactone (ALDACTONE) 25 MG tablet Take 25 mg by mouth daily.  6  . torsemide (DEMADEX) 20 MG tablet TAKE 1 OR 2 TABLETS BY MOUTH EVERY DAY AS DIRECTED  6  . XYLOCAINE 1 % (with preservative) injection USE 3.49m with invanz FOR injection  0   No current facility-administered medications on file prior to visit.     Not on File  Objective: There were no vitals filed for this visit.  General: No acute distress, AAOx3 patient presents wearing post op shoe on right and with no dressings and dirt on bottom of both feet  Right foot, Surgical site with open amputation wound Now scabbed over, mild swelling to right foot, no focal  erythema, no warmth, no drainage, Sub met 1 ulceration now scabbed over with dry heme, no other acute signs of infection noted, To Left anterior skin there is a venous leg ulceration is now healed with edema that is improved, Capillary fill time <3 seconds in all remaining digits, gross sensation present via light touch bilateral. No pain or crepitation with range of motion right or left foot.  No pain with calf compression.   Assessment and Plan:  Problem List Items Addressed This Visit    None    Visit Diagnoses    S/P amputation of lesser toe, right (HCC)    -  Primary   Right foot ulcer, limited to breakdown of skin ([facility]       Scabbing over   PVD (peripheral vascular disease) ([place]       Diabetic polyneuropathy associated with type 2 diabetes mellitus ([place]          -Patient seen and evaluated -Applied. Protective Band-Aid and antibiotic cream to scabbed over areas and advised patient to allow scabs to fall off on their own -[employer] diabetic shoe order form was completed; office to contact primary care for approval / certification;  Office to arrange shoe fitting and dispensing. Patient to let [place] his primary care doctor's name.  -Advised patient to limit activity to necessity  -Advised patient to elevate daily -Patient to return to office for diabetic shoes or sooner if problems or issues arise  TLandis Martins DPM

## 2017-02-23 DIAGNOSIS — N401 Enlarged prostate with lower urinary tract symptoms: Secondary | ICD-10-CM | POA: Diagnosis not present

## 2017-02-23 DIAGNOSIS — N471 Phimosis: Secondary | ICD-10-CM | POA: Diagnosis not present

## 2017-02-23 DIAGNOSIS — R351 Nocturia: Secondary | ICD-10-CM | POA: Diagnosis not present

## 2017-02-23 DIAGNOSIS — Z125 Encounter for screening for malignant neoplasm of prostate: Secondary | ICD-10-CM | POA: Diagnosis not present

## 2017-02-27 DIAGNOSIS — D0439 Carcinoma in situ of skin of other parts of face: Secondary | ICD-10-CM | POA: Diagnosis not present

## 2017-02-27 DIAGNOSIS — L570 Actinic keratosis: Secondary | ICD-10-CM | POA: Diagnosis not present

## 2017-03-25 DIAGNOSIS — N471 Phimosis: Secondary | ICD-10-CM | POA: Diagnosis not present

## 2017-03-25 DIAGNOSIS — N63 Unspecified lump in unspecified breast: Secondary | ICD-10-CM | POA: Diagnosis not present

## 2017-03-25 DIAGNOSIS — N62 Hypertrophy of breast: Secondary | ICD-10-CM | POA: Diagnosis not present

## 2017-03-25 DIAGNOSIS — N401 Enlarged prostate with lower urinary tract symptoms: Secondary | ICD-10-CM | POA: Diagnosis not present

## 2017-03-25 DIAGNOSIS — R351 Nocturia: Secondary | ICD-10-CM | POA: Diagnosis not present

## 2017-03-25 DIAGNOSIS — N481 Balanitis: Secondary | ICD-10-CM | POA: Diagnosis not present

## 2017-04-01 DIAGNOSIS — R928 Other abnormal and inconclusive findings on diagnostic imaging of breast: Secondary | ICD-10-CM | POA: Diagnosis not present

## 2017-04-01 DIAGNOSIS — N63 Unspecified lump in unspecified breast: Secondary | ICD-10-CM | POA: Diagnosis not present

## 2017-04-20 DIAGNOSIS — I255 Ischemic cardiomyopathy: Secondary | ICD-10-CM | POA: Diagnosis not present

## 2017-04-20 DIAGNOSIS — I484 Atypical atrial flutter: Secondary | ICD-10-CM | POA: Diagnosis not present

## 2017-04-20 DIAGNOSIS — E1149 Type 2 diabetes mellitus with other diabetic neurological complication: Secondary | ICD-10-CM | POA: Diagnosis not present

## 2017-04-20 DIAGNOSIS — Z7901 Long term (current) use of anticoagulants: Secondary | ICD-10-CM | POA: Diagnosis not present

## 2017-04-20 DIAGNOSIS — I10 Essential (primary) hypertension: Secondary | ICD-10-CM | POA: Diagnosis not present

## 2017-04-20 DIAGNOSIS — Z95810 Presence of automatic (implantable) cardiac defibrillator: Secondary | ICD-10-CM | POA: Diagnosis not present

## 2017-04-20 DIAGNOSIS — E7800 Pure hypercholesterolemia, unspecified: Secondary | ICD-10-CM | POA: Diagnosis not present

## 2017-04-21 DIAGNOSIS — I10 Essential (primary) hypertension: Secondary | ICD-10-CM | POA: Diagnosis not present

## 2017-04-21 DIAGNOSIS — M949 Disorder of cartilage, unspecified: Secondary | ICD-10-CM | POA: Diagnosis not present

## 2017-04-21 DIAGNOSIS — J309 Allergic rhinitis, unspecified: Secondary | ICD-10-CM | POA: Diagnosis not present

## 2017-04-21 DIAGNOSIS — E785 Hyperlipidemia, unspecified: Secondary | ICD-10-CM | POA: Diagnosis not present

## 2017-04-21 DIAGNOSIS — R5381 Other malaise: Secondary | ICD-10-CM | POA: Diagnosis not present

## 2017-04-21 DIAGNOSIS — Z125 Encounter for screening for malignant neoplasm of prostate: Secondary | ICD-10-CM | POA: Diagnosis not present

## 2017-04-21 DIAGNOSIS — Z91018 Allergy to other foods: Secondary | ICD-10-CM | POA: Diagnosis not present

## 2017-04-21 DIAGNOSIS — E1165 Type 2 diabetes mellitus with hyperglycemia: Secondary | ICD-10-CM | POA: Diagnosis not present

## 2017-04-23 DIAGNOSIS — Z0001 Encounter for general adult medical examination with abnormal findings: Secondary | ICD-10-CM | POA: Diagnosis not present

## 2017-04-23 DIAGNOSIS — R5381 Other malaise: Secondary | ICD-10-CM | POA: Diagnosis not present

## 2017-04-23 DIAGNOSIS — Z1211 Encounter for screening for malignant neoplasm of colon: Secondary | ICD-10-CM | POA: Diagnosis not present

## 2017-04-23 DIAGNOSIS — Z6827 Body mass index (BMI) 27.0-27.9, adult: Secondary | ICD-10-CM | POA: Diagnosis not present

## 2017-04-23 DIAGNOSIS — I10 Essential (primary) hypertension: Secondary | ICD-10-CM | POA: Diagnosis not present

## 2017-04-23 DIAGNOSIS — Z13820 Encounter for screening for osteoporosis: Secondary | ICD-10-CM | POA: Diagnosis not present

## 2017-05-07 DIAGNOSIS — D649 Anemia, unspecified: Secondary | ICD-10-CM | POA: Diagnosis not present

## 2017-05-07 DIAGNOSIS — I739 Peripheral vascular disease, unspecified: Secondary | ICD-10-CM | POA: Diagnosis not present

## 2017-05-07 DIAGNOSIS — E1165 Type 2 diabetes mellitus with hyperglycemia: Secondary | ICD-10-CM | POA: Diagnosis not present

## 2017-05-07 DIAGNOSIS — E785 Hyperlipidemia, unspecified: Secondary | ICD-10-CM | POA: Diagnosis not present

## 2017-05-07 DIAGNOSIS — R5381 Other malaise: Secondary | ICD-10-CM | POA: Diagnosis not present

## 2017-05-14 DIAGNOSIS — R5381 Other malaise: Secondary | ICD-10-CM | POA: Diagnosis not present

## 2017-05-14 DIAGNOSIS — Z23 Encounter for immunization: Secondary | ICD-10-CM | POA: Diagnosis not present

## 2017-05-14 DIAGNOSIS — E1165 Type 2 diabetes mellitus with hyperglycemia: Secondary | ICD-10-CM | POA: Diagnosis not present

## 2017-05-14 DIAGNOSIS — D649 Anemia, unspecified: Secondary | ICD-10-CM | POA: Diagnosis not present

## 2017-05-14 DIAGNOSIS — E785 Hyperlipidemia, unspecified: Secondary | ICD-10-CM | POA: Diagnosis not present

## 2017-06-01 DIAGNOSIS — C44311 Basal cell carcinoma of skin of nose: Secondary | ICD-10-CM | POA: Diagnosis not present

## 2017-06-01 DIAGNOSIS — L570 Actinic keratosis: Secondary | ICD-10-CM | POA: Diagnosis not present

## 2017-06-01 DIAGNOSIS — L578 Other skin changes due to chronic exposure to nonionizing radiation: Secondary | ICD-10-CM | POA: Diagnosis not present

## 2017-06-10 DIAGNOSIS — E785 Hyperlipidemia, unspecified: Secondary | ICD-10-CM | POA: Diagnosis not present

## 2017-06-10 DIAGNOSIS — Z1339 Encounter for screening examination for other mental health and behavioral disorders: Secondary | ICD-10-CM | POA: Diagnosis not present

## 2017-06-10 DIAGNOSIS — Z1331 Encounter for screening for depression: Secondary | ICD-10-CM | POA: Diagnosis not present

## 2017-06-10 DIAGNOSIS — R350 Frequency of micturition: Secondary | ICD-10-CM | POA: Diagnosis not present

## 2017-06-10 DIAGNOSIS — I10 Essential (primary) hypertension: Secondary | ICD-10-CM | POA: Diagnosis not present

## 2017-06-10 DIAGNOSIS — D649 Anemia, unspecified: Secondary | ICD-10-CM | POA: Diagnosis not present

## 2017-06-10 DIAGNOSIS — R5381 Other malaise: Secondary | ICD-10-CM | POA: Diagnosis not present

## 2017-06-10 DIAGNOSIS — Z008 Encounter for other general examination: Secondary | ICD-10-CM | POA: Diagnosis not present

## 2017-06-16 DIAGNOSIS — E11621 Type 2 diabetes mellitus with foot ulcer: Secondary | ICD-10-CM | POA: Diagnosis not present

## 2017-06-16 DIAGNOSIS — L0390 Cellulitis, unspecified: Secondary | ICD-10-CM | POA: Diagnosis not present

## 2017-06-17 ENCOUNTER — Encounter: Payer: Self-pay | Admitting: Sports Medicine

## 2017-06-17 ENCOUNTER — Ambulatory Visit (INDEPENDENT_AMBULATORY_CARE_PROVIDER_SITE_OTHER): Payer: Medicare HMO | Admitting: Sports Medicine

## 2017-06-17 ENCOUNTER — Ambulatory Visit (INDEPENDENT_AMBULATORY_CARE_PROVIDER_SITE_OTHER): Payer: Medicare HMO

## 2017-06-17 DIAGNOSIS — L97416 Non-pressure chronic ulcer of right heel and midfoot with bone involvement without evidence of necrosis: Secondary | ICD-10-CM | POA: Diagnosis not present

## 2017-06-17 DIAGNOSIS — Z89421 Acquired absence of other right toe(s): Secondary | ICD-10-CM

## 2017-06-17 DIAGNOSIS — E1142 Type 2 diabetes mellitus with diabetic polyneuropathy: Secondary | ICD-10-CM

## 2017-06-17 DIAGNOSIS — L97514 Non-pressure chronic ulcer of other part of right foot with necrosis of bone: Secondary | ICD-10-CM | POA: Diagnosis not present

## 2017-06-17 DIAGNOSIS — B9620 Unspecified Escherichia coli [E. coli] as the cause of diseases classified elsewhere: Secondary | ICD-10-CM | POA: Diagnosis not present

## 2017-06-17 DIAGNOSIS — I10 Essential (primary) hypertension: Secondary | ICD-10-CM | POA: Diagnosis not present

## 2017-06-17 DIAGNOSIS — I739 Peripheral vascular disease, unspecified: Secondary | ICD-10-CM | POA: Diagnosis not present

## 2017-06-17 DIAGNOSIS — L02612 Cutaneous abscess of left foot: Secondary | ICD-10-CM | POA: Diagnosis not present

## 2017-06-17 DIAGNOSIS — L03115 Cellulitis of right lower limb: Secondary | ICD-10-CM | POA: Diagnosis not present

## 2017-06-17 DIAGNOSIS — I5022 Chronic systolic (congestive) heart failure: Secondary | ICD-10-CM | POA: Diagnosis not present

## 2017-06-17 DIAGNOSIS — N183 Chronic kidney disease, stage 3 (moderate): Secondary | ICD-10-CM | POA: Diagnosis not present

## 2017-06-17 DIAGNOSIS — I96 Gangrene, not elsewhere classified: Secondary | ICD-10-CM | POA: Diagnosis not present

## 2017-06-17 DIAGNOSIS — L0390 Cellulitis, unspecified: Secondary | ICD-10-CM | POA: Diagnosis not present

## 2017-06-17 DIAGNOSIS — E039 Hypothyroidism, unspecified: Secondary | ICD-10-CM | POA: Diagnosis not present

## 2017-06-17 DIAGNOSIS — L97519 Non-pressure chronic ulcer of other part of right foot with unspecified severity: Secondary | ICD-10-CM | POA: Diagnosis not present

## 2017-06-17 DIAGNOSIS — M868X7 Other osteomyelitis, ankle and foot: Secondary | ICD-10-CM | POA: Diagnosis not present

## 2017-06-17 DIAGNOSIS — Z8614 Personal history of Methicillin resistant Staphylococcus aureus infection: Secondary | ICD-10-CM | POA: Diagnosis not present

## 2017-06-17 DIAGNOSIS — I130 Hypertensive heart and chronic kidney disease with heart failure and stage 1 through stage 4 chronic kidney disease, or unspecified chronic kidney disease: Secondary | ICD-10-CM | POA: Diagnosis not present

## 2017-06-17 DIAGNOSIS — B952 Enterococcus as the cause of diseases classified elsewhere: Secondary | ICD-10-CM | POA: Diagnosis not present

## 2017-06-17 DIAGNOSIS — I482 Chronic atrial fibrillation: Secondary | ICD-10-CM | POA: Diagnosis not present

## 2017-06-17 DIAGNOSIS — M79671 Pain in right foot: Secondary | ICD-10-CM

## 2017-06-17 DIAGNOSIS — E11621 Type 2 diabetes mellitus with foot ulcer: Secondary | ICD-10-CM | POA: Diagnosis not present

## 2017-06-17 DIAGNOSIS — E118 Type 2 diabetes mellitus with unspecified complications: Secondary | ICD-10-CM | POA: Diagnosis not present

## 2017-06-17 DIAGNOSIS — I4891 Unspecified atrial fibrillation: Secondary | ICD-10-CM | POA: Diagnosis not present

## 2017-06-17 DIAGNOSIS — E1121 Type 2 diabetes mellitus with diabetic nephropathy: Secondary | ICD-10-CM | POA: Diagnosis not present

## 2017-06-17 DIAGNOSIS — E1122 Type 2 diabetes mellitus with diabetic chronic kidney disease: Secondary | ICD-10-CM | POA: Diagnosis not present

## 2017-06-17 NOTE — Progress Notes (Signed)
Subjective: Jose Heath is a 66 y.o. male patient seen in office for evaluation of ulceration of the Right medial 1st MTPJ and sub met 1, states that it started 1 to 2 weeks ago said a blister came up. States he tried to use everything on it even his sisters medication. Patient states that he even took aspirin to see if it would help. Patient has a history of diabetes and a blood glucose not recorded. Denies nausea/fever/vomiting/chills/night sweats/shortness of breath/pain. Patient has no other pedal complaints at this time.  Patient Active Problem List   Diagnosis Date Noted  . Arthritis 09/17/2016  . Atrial flutter ([place]) 09/17/2016  . Coronary artery disease 09/17/2016  . Diabetes mellitus ([place]) 09/17/2016  . Disease of thyroid gland 09/17/2016  . HOH (hard of hearing) 09/17/2016  . Hyperlipidemia 09/17/2016  . ICD (implantable cardioverter-defibrillator) in place 09/17/2016  . Left foot pain 09/17/2016  . Skin cancer 09/17/2016  . TSH (thyroid-stimulating hormone deficiency) 08/13/2016  . Anasarca 07/30/2016  . Charcot foot due to diabetes mellitus ([place]) 02/13/2016  . Atypical atrial flutter ([place]) 10/04/2015  . Essential hypertension 10/04/2015  . Ischemic cardiomyopathy 10/04/2015  . Pure hypercholesterolemia 10/04/2015  . Amputated toe of left foot ([place]) 08/15/2015  . Diabetic peripheral neuropathy ([place]) 08/15/2015  . Type 2 diabetes mellitus with Charcot's joint of left foot ([place]) 08/15/2015  . Cardiac defibrillator in place 05/29/2015  . Cardiomyopathy ([place]) 05/29/2015  . Dual ICD (implantable cardioverter-defibrillator) in place 05/29/2015   Current Outpatient Medications on File Prior to Visit  Medication Sig Dispense Refill  . amiodarone (PACERONE) 200 MG tablet Take 200 mg by mouth daily.  1  . apixaban (ELIQUIS) 5 MG TABS tablet Take 5 mg by mouth.    Marland Kitchen atorvastatin (LIPITOR) 40 MG tablet TAKE 1 TABLET ONCE DAILY.    Marland Kitchen atorvastatin (LIPITOR) 40 MG tablet Take 40 mg  by mouth daily.  0  . becaplermin (REGRANEX) 0.01 % gel Apply to affected area 3 times week. 15 g 3  . carvedilol (COREG) 6.25 MG tablet Take 6.25 mg by mouth 2 (two) times daily.  1  . doxycycline (VIBRAMYCIN) 100 MG capsule Take 1 capsule (100 mg total) by mouth 2 (two) times daily. 28 capsule 0  . ELIQUIS 2.5 MG TABS tablet TAKE ONE TABLET BY MOUTH TWICE DAILY FOR afib  0  . furosemide (LASIX) 40 MG tablet Take 40 mg by mouth 2 (two) times daily.  2  . glimepiride (AMARYL) 2 MG tablet Take 2 mg by mouth.    Colbert Ewing 1 g injection See admin instructions.  0  . IRON PO Take 325 mg by mouth.    . levofloxacin (LEVAQUIN) 500 MG tablet Take 1 tablet (500 mg total) by mouth daily. 14 tablet 0  . levothyroxine (SYNTHROID, LEVOTHROID) 100 MCG tablet Take by mouth.    . levothyroxine (SYNTHROID, LEVOTHROID) 100 MCG tablet Take 100 mcg by mouth every morning.  6  . levothyroxine (SYNTHROID, LEVOTHROID) 88 MCG tablet Take by mouth.    . levothyroxine (SYNTHROID, LEVOTHROID) 88 MCG tablet Take 88 mcg by mouth daily.  0  . Linezolid in Sodium Chloride 600-0.9 MG/300ML-% SOLN     . losartan (COZAAR) 25 MG tablet Take 25 mg by mouth.    . losartan (COZAAR) 50 MG tablet Take 50 mg by mouth.    . metFORMIN (GLUCOPHAGE) 1000 MG tablet Take 1,000 mg by mouth 2 (two) times daily with a meal.  0  . mupirocin  ointment (BACTROBAN) 2 %     . simvastatin (ZOCOR) 20 MG tablet Take by mouth.    . spironolactone (ALDACTONE) 25 MG tablet Take 25 mg by mouth daily.  6  . torsemide (DEMADEX) 20 MG tablet TAKE 1 OR 2 TABLETS BY MOUTH EVERY DAY AS DIRECTED  6  . XYLOCAINE 1 % (with preservative) injection USE 3.88m with invanz FOR injection  0   No current facility-administered medications on file prior to visit.    Not on File  No results found for this or any previous visit (from the past 2160 hour(s)).  Objective: There were no vitals filed for this visit.  General: Patient is awake, alert, oriented x 3 and in  no acute distress.  Dermatology: Skin is warm and dry bilateral with a full thickness ulceration present at medial 1st MTPJ on right down to level of capsule. Ulceration measures 3cm x 3cm x 0.4 cm. There is a keratotic border with a fibronecrotic base. The ulcerationdoes not  probe to bone but tunnels to a plantar wound sub met 1 that measures 0.5x0.5cm. There is malodor, scant puss active drainage, + erythema/cellulitis to level of midfoot, &  edema. No other acute signs of infection.   Vascular: Dorsalis Pedis pulse = 1/4 Bilateral,  Posterior Tibial pulse = 0/4 Bilateral,  Capillary Fill Time < 5 seconds  Neurologic: Protective sensation absent on right.  Musculosketal: There is no pain with palpation to ulcerated area. No pain with compression to calves bilateral. S/p Right 5th toe amputation secondary to gangrene.  No results for input(s): GRAMSTAIN, LABORGA in the last 8760 hours.  Assessment and Plan:  Problem List Items Addressed This Visit    None    Visit Diagnoses    Right foot ulcer, with necrosis of bone ([place]    -  Primary   Relevant Orders   DG Foot Complete Right   WOUND CULTURE   PVD (peripheral vascular disease) ([place]       Diabetic polyneuropathy associated with type 2 diabetes mellitus ([place]       Gangrene of toe of right foot (HCC)       S/P amputation of lesser toe, right (HCC)       Right foot pain          -Examined patient and discussed the progression of the wound and treatment alternatives. -Xrays reviewed with no acute gas gangrene or bony erosion on right - Excisionally dedbrided ulceration at to bleeding borders removing nonviable tissue using a sterile chisel blade. Wound measures post debridement as above. Hemostasis was achieved with manuel pressure. Patient tolerated procedure well without any discomfort or anesthesia necessary for this wound debridement.  -Wound culture was obtained  -Applied betadine and dry sterile dressing to right  foot -[facility] for possible OR tomorrow  -Patient to return to office after discharged from hospital.   TLandis Martins DPM

## 2017-06-18 DIAGNOSIS — I4891 Unspecified atrial fibrillation: Secondary | ICD-10-CM

## 2017-06-18 DIAGNOSIS — L0390 Cellulitis, unspecified: Secondary | ICD-10-CM

## 2017-06-18 DIAGNOSIS — E11621 Type 2 diabetes mellitus with foot ulcer: Secondary | ICD-10-CM

## 2017-06-19 DIAGNOSIS — E11621 Type 2 diabetes mellitus with foot ulcer: Secondary | ICD-10-CM

## 2017-06-20 DIAGNOSIS — E11621 Type 2 diabetes mellitus with foot ulcer: Secondary | ICD-10-CM

## 2017-06-21 LAB — WOUND CULTURE

## 2017-06-22 DIAGNOSIS — I10 Essential (primary) hypertension: Secondary | ICD-10-CM | POA: Diagnosis not present

## 2017-06-22 DIAGNOSIS — Z09 Encounter for follow-up examination after completed treatment for conditions other than malignant neoplasm: Secondary | ICD-10-CM | POA: Diagnosis not present

## 2017-06-22 DIAGNOSIS — E785 Hyperlipidemia, unspecified: Secondary | ICD-10-CM | POA: Diagnosis not present

## 2017-06-22 DIAGNOSIS — R5381 Other malaise: Secondary | ICD-10-CM | POA: Diagnosis not present

## 2017-06-22 DIAGNOSIS — D649 Anemia, unspecified: Secondary | ICD-10-CM | POA: Diagnosis not present

## 2017-06-22 DIAGNOSIS — E1165 Type 2 diabetes mellitus with hyperglycemia: Secondary | ICD-10-CM | POA: Diagnosis not present

## 2017-06-23 ENCOUNTER — Telehealth: Payer: Self-pay | Admitting: *Deleted

## 2017-06-23 DIAGNOSIS — L97519 Non-pressure chronic ulcer of other part of right foot with unspecified severity: Secondary | ICD-10-CM | POA: Diagnosis not present

## 2017-06-23 DIAGNOSIS — Z8614 Personal history of Methicillin resistant Staphylococcus aureus infection: Secondary | ICD-10-CM | POA: Diagnosis not present

## 2017-06-23 DIAGNOSIS — I482 Chronic atrial fibrillation: Secondary | ICD-10-CM | POA: Diagnosis not present

## 2017-06-23 DIAGNOSIS — E1122 Type 2 diabetes mellitus with diabetic chronic kidney disease: Secondary | ICD-10-CM | POA: Diagnosis not present

## 2017-06-23 DIAGNOSIS — Z79891 Long term (current) use of opiate analgesic: Secondary | ICD-10-CM | POA: Diagnosis not present

## 2017-06-23 DIAGNOSIS — I509 Heart failure, unspecified: Secondary | ICD-10-CM | POA: Diagnosis not present

## 2017-06-23 DIAGNOSIS — I2510 Atherosclerotic heart disease of native coronary artery without angina pectoris: Secondary | ICD-10-CM | POA: Diagnosis not present

## 2017-06-23 DIAGNOSIS — N183 Chronic kidney disease, stage 3 (moderate): Secondary | ICD-10-CM | POA: Diagnosis not present

## 2017-06-23 DIAGNOSIS — E11621 Type 2 diabetes mellitus with foot ulcer: Secondary | ICD-10-CM | POA: Diagnosis not present

## 2017-06-23 DIAGNOSIS — I130 Hypertensive heart and chronic kidney disease with heart failure and stage 1 through stage 4 chronic kidney disease, or unspecified chronic kidney disease: Secondary | ICD-10-CM | POA: Diagnosis not present

## 2017-06-23 NOTE — Telephone Encounter (Signed)
Jose Heath [facility] states pt is refusing to allow her to change his dressing, stating it was changed Sunday. Jose Heath states hospital orders are not clear.

## 2017-06-23 NOTE — Telephone Encounter (Signed)
Left message informing [name] [facility], Dr. Cannon Kettle had ordered Adaptic with Steri-strips over the graft with stitches, and if cleansing needed use sterile normal saline, then cover Adaptic with Steri-strips sterile 4x4 gauze, kerlix and ace wrap, dressing changes to be Monday, Wednesday, Friday. Left message informing [name] of Dr. Leeanne Rio orders.

## 2017-06-23 NOTE — Telephone Encounter (Signed)
Thanks

## 2017-06-24 DIAGNOSIS — Z79891 Long term (current) use of opiate analgesic: Secondary | ICD-10-CM | POA: Diagnosis not present

## 2017-06-24 DIAGNOSIS — I2510 Atherosclerotic heart disease of native coronary artery without angina pectoris: Secondary | ICD-10-CM | POA: Diagnosis not present

## 2017-06-24 DIAGNOSIS — L97519 Non-pressure chronic ulcer of other part of right foot with unspecified severity: Secondary | ICD-10-CM | POA: Diagnosis not present

## 2017-06-24 DIAGNOSIS — Z8614 Personal history of Methicillin resistant Staphylococcus aureus infection: Secondary | ICD-10-CM | POA: Diagnosis not present

## 2017-06-24 DIAGNOSIS — N529 Male erectile dysfunction, unspecified: Secondary | ICD-10-CM | POA: Diagnosis not present

## 2017-06-24 DIAGNOSIS — I509 Heart failure, unspecified: Secondary | ICD-10-CM | POA: Diagnosis not present

## 2017-06-24 DIAGNOSIS — N183 Chronic kidney disease, stage 3 (moderate): Secondary | ICD-10-CM | POA: Diagnosis not present

## 2017-06-24 DIAGNOSIS — E11621 Type 2 diabetes mellitus with foot ulcer: Secondary | ICD-10-CM | POA: Diagnosis not present

## 2017-06-24 DIAGNOSIS — N481 Balanitis: Secondary | ICD-10-CM | POA: Diagnosis not present

## 2017-06-24 DIAGNOSIS — I482 Chronic atrial fibrillation: Secondary | ICD-10-CM | POA: Diagnosis not present

## 2017-06-24 DIAGNOSIS — E1122 Type 2 diabetes mellitus with diabetic chronic kidney disease: Secondary | ICD-10-CM | POA: Diagnosis not present

## 2017-06-24 DIAGNOSIS — N471 Phimosis: Secondary | ICD-10-CM | POA: Diagnosis not present

## 2017-06-24 DIAGNOSIS — N401 Enlarged prostate with lower urinary tract symptoms: Secondary | ICD-10-CM | POA: Diagnosis not present

## 2017-06-24 DIAGNOSIS — I130 Hypertensive heart and chronic kidney disease with heart failure and stage 1 through stage 4 chronic kidney disease, or unspecified chronic kidney disease: Secondary | ICD-10-CM | POA: Diagnosis not present

## 2017-06-25 DIAGNOSIS — R5381 Other malaise: Secondary | ICD-10-CM | POA: Diagnosis not present

## 2017-06-25 DIAGNOSIS — I951 Orthostatic hypotension: Secondary | ICD-10-CM | POA: Diagnosis not present

## 2017-06-25 DIAGNOSIS — E1165 Type 2 diabetes mellitus with hyperglycemia: Secondary | ICD-10-CM | POA: Diagnosis not present

## 2017-06-25 DIAGNOSIS — E785 Hyperlipidemia, unspecified: Secondary | ICD-10-CM | POA: Diagnosis not present

## 2017-06-25 DIAGNOSIS — D649 Anemia, unspecified: Secondary | ICD-10-CM | POA: Diagnosis not present

## 2017-06-26 DIAGNOSIS — I482 Chronic atrial fibrillation: Secondary | ICD-10-CM | POA: Diagnosis not present

## 2017-06-26 DIAGNOSIS — L97519 Non-pressure chronic ulcer of other part of right foot with unspecified severity: Secondary | ICD-10-CM | POA: Diagnosis not present

## 2017-06-26 DIAGNOSIS — Z79891 Long term (current) use of opiate analgesic: Secondary | ICD-10-CM | POA: Diagnosis not present

## 2017-06-26 DIAGNOSIS — E1122 Type 2 diabetes mellitus with diabetic chronic kidney disease: Secondary | ICD-10-CM | POA: Diagnosis not present

## 2017-06-26 DIAGNOSIS — N183 Chronic kidney disease, stage 3 (moderate): Secondary | ICD-10-CM | POA: Diagnosis not present

## 2017-06-26 DIAGNOSIS — I509 Heart failure, unspecified: Secondary | ICD-10-CM | POA: Diagnosis not present

## 2017-06-26 DIAGNOSIS — E11621 Type 2 diabetes mellitus with foot ulcer: Secondary | ICD-10-CM | POA: Diagnosis not present

## 2017-06-26 DIAGNOSIS — I130 Hypertensive heart and chronic kidney disease with heart failure and stage 1 through stage 4 chronic kidney disease, or unspecified chronic kidney disease: Secondary | ICD-10-CM | POA: Diagnosis not present

## 2017-06-26 DIAGNOSIS — I2510 Atherosclerotic heart disease of native coronary artery without angina pectoris: Secondary | ICD-10-CM | POA: Diagnosis not present

## 2017-06-26 DIAGNOSIS — Z8614 Personal history of Methicillin resistant Staphylococcus aureus infection: Secondary | ICD-10-CM | POA: Diagnosis not present

## 2017-06-26 MED ORDER — AMOXICILLIN-POT CLAVULANATE 875-125 MG PO TABS: 1.0000 | ORAL_TABLET | Freq: Two times a day (BID) | ORAL | 0 refills | Status: DC

## 2017-06-26 NOTE — Telephone Encounter (Signed)
I informed [name] [facility] Dr. Cannon Kettle had ordered the Augmentin and I had sent to the [facility]. Left message on home phone informing pt Dr. Cannon Kettle had ordered an antibiotic through his [place]. Unable to leave a message on mobile phone the line was busy.

## 2017-06-26 NOTE — Telephone Encounter (Signed)
Please send Augmentin BID 28 tabs to his pharmacy. He should of been discharged from hospital with an Rx to have filled at his pharmacy.  -Dr. Cannon Kettle

## 2017-06-26 NOTE — Telephone Encounter (Signed)
Jose Heath - [facility] states pt was to begin Augmentin 875/125 after hospital discharge but has not received.

## 2017-06-26 NOTE — Addendum Note (Signed)
Addended by: Harriett Sine D on: 06/26/2017 11:20 AM   Modules accepted: Orders

## 2017-06-29 DIAGNOSIS — N183 Chronic kidney disease, stage 3 (moderate): Secondary | ICD-10-CM | POA: Diagnosis not present

## 2017-06-29 DIAGNOSIS — Z8614 Personal history of Methicillin resistant Staphylococcus aureus infection: Secondary | ICD-10-CM | POA: Diagnosis not present

## 2017-06-29 DIAGNOSIS — I509 Heart failure, unspecified: Secondary | ICD-10-CM | POA: Diagnosis not present

## 2017-06-29 DIAGNOSIS — E1122 Type 2 diabetes mellitus with diabetic chronic kidney disease: Secondary | ICD-10-CM | POA: Diagnosis not present

## 2017-06-29 DIAGNOSIS — I130 Hypertensive heart and chronic kidney disease with heart failure and stage 1 through stage 4 chronic kidney disease, or unspecified chronic kidney disease: Secondary | ICD-10-CM | POA: Diagnosis not present

## 2017-06-29 DIAGNOSIS — I2510 Atherosclerotic heart disease of native coronary artery without angina pectoris: Secondary | ICD-10-CM | POA: Diagnosis not present

## 2017-06-29 DIAGNOSIS — L97519 Non-pressure chronic ulcer of other part of right foot with unspecified severity: Secondary | ICD-10-CM | POA: Diagnosis not present

## 2017-06-29 DIAGNOSIS — E11621 Type 2 diabetes mellitus with foot ulcer: Secondary | ICD-10-CM | POA: Diagnosis not present

## 2017-06-29 DIAGNOSIS — Z79891 Long term (current) use of opiate analgesic: Secondary | ICD-10-CM | POA: Diagnosis not present

## 2017-06-29 DIAGNOSIS — I482 Chronic atrial fibrillation: Secondary | ICD-10-CM | POA: Diagnosis not present

## 2017-07-01 DIAGNOSIS — E1122 Type 2 diabetes mellitus with diabetic chronic kidney disease: Secondary | ICD-10-CM | POA: Diagnosis not present

## 2017-07-01 DIAGNOSIS — I2510 Atherosclerotic heart disease of native coronary artery without angina pectoris: Secondary | ICD-10-CM | POA: Diagnosis not present

## 2017-07-01 DIAGNOSIS — E11621 Type 2 diabetes mellitus with foot ulcer: Secondary | ICD-10-CM | POA: Diagnosis not present

## 2017-07-01 DIAGNOSIS — Z8614 Personal history of Methicillin resistant Staphylococcus aureus infection: Secondary | ICD-10-CM | POA: Diagnosis not present

## 2017-07-01 DIAGNOSIS — L97519 Non-pressure chronic ulcer of other part of right foot with unspecified severity: Secondary | ICD-10-CM | POA: Diagnosis not present

## 2017-07-01 DIAGNOSIS — N183 Chronic kidney disease, stage 3 (moderate): Secondary | ICD-10-CM | POA: Diagnosis not present

## 2017-07-01 DIAGNOSIS — I130 Hypertensive heart and chronic kidney disease with heart failure and stage 1 through stage 4 chronic kidney disease, or unspecified chronic kidney disease: Secondary | ICD-10-CM | POA: Diagnosis not present

## 2017-07-01 DIAGNOSIS — I482 Chronic atrial fibrillation: Secondary | ICD-10-CM | POA: Diagnosis not present

## 2017-07-01 DIAGNOSIS — I509 Heart failure, unspecified: Secondary | ICD-10-CM | POA: Diagnosis not present

## 2017-07-01 DIAGNOSIS — Z79891 Long term (current) use of opiate analgesic: Secondary | ICD-10-CM | POA: Diagnosis not present

## 2017-07-03 DIAGNOSIS — I130 Hypertensive heart and chronic kidney disease with heart failure and stage 1 through stage 4 chronic kidney disease, or unspecified chronic kidney disease: Secondary | ICD-10-CM | POA: Diagnosis not present

## 2017-07-03 DIAGNOSIS — N183 Chronic kidney disease, stage 3 (moderate): Secondary | ICD-10-CM | POA: Diagnosis not present

## 2017-07-03 DIAGNOSIS — E1122 Type 2 diabetes mellitus with diabetic chronic kidney disease: Secondary | ICD-10-CM | POA: Diagnosis not present

## 2017-07-03 DIAGNOSIS — I509 Heart failure, unspecified: Secondary | ICD-10-CM | POA: Diagnosis not present

## 2017-07-03 DIAGNOSIS — Z79891 Long term (current) use of opiate analgesic: Secondary | ICD-10-CM | POA: Diagnosis not present

## 2017-07-03 DIAGNOSIS — L97519 Non-pressure chronic ulcer of other part of right foot with unspecified severity: Secondary | ICD-10-CM | POA: Diagnosis not present

## 2017-07-03 DIAGNOSIS — E11621 Type 2 diabetes mellitus with foot ulcer: Secondary | ICD-10-CM | POA: Diagnosis not present

## 2017-07-03 DIAGNOSIS — I2510 Atherosclerotic heart disease of native coronary artery without angina pectoris: Secondary | ICD-10-CM | POA: Diagnosis not present

## 2017-07-03 DIAGNOSIS — Z8614 Personal history of Methicillin resistant Staphylococcus aureus infection: Secondary | ICD-10-CM | POA: Diagnosis not present

## 2017-07-03 DIAGNOSIS — I482 Chronic atrial fibrillation: Secondary | ICD-10-CM | POA: Diagnosis not present

## 2017-07-06 ENCOUNTER — Telehealth: Payer: Self-pay | Admitting: *Deleted

## 2017-07-06 DIAGNOSIS — Z8614 Personal history of Methicillin resistant Staphylococcus aureus infection: Secondary | ICD-10-CM | POA: Diagnosis not present

## 2017-07-06 DIAGNOSIS — N183 Chronic kidney disease, stage 3 (moderate): Secondary | ICD-10-CM | POA: Diagnosis not present

## 2017-07-06 DIAGNOSIS — L97519 Non-pressure chronic ulcer of other part of right foot with unspecified severity: Secondary | ICD-10-CM | POA: Diagnosis not present

## 2017-07-06 DIAGNOSIS — I2510 Atherosclerotic heart disease of native coronary artery without angina pectoris: Secondary | ICD-10-CM | POA: Diagnosis not present

## 2017-07-06 DIAGNOSIS — E1122 Type 2 diabetes mellitus with diabetic chronic kidney disease: Secondary | ICD-10-CM | POA: Diagnosis not present

## 2017-07-06 DIAGNOSIS — E11621 Type 2 diabetes mellitus with foot ulcer: Secondary | ICD-10-CM | POA: Diagnosis not present

## 2017-07-06 DIAGNOSIS — I130 Hypertensive heart and chronic kidney disease with heart failure and stage 1 through stage 4 chronic kidney disease, or unspecified chronic kidney disease: Secondary | ICD-10-CM | POA: Diagnosis not present

## 2017-07-06 DIAGNOSIS — Z79891 Long term (current) use of opiate analgesic: Secondary | ICD-10-CM | POA: Diagnosis not present

## 2017-07-06 DIAGNOSIS — I509 Heart failure, unspecified: Secondary | ICD-10-CM | POA: Diagnosis not present

## 2017-07-06 DIAGNOSIS — I482 Chronic atrial fibrillation: Secondary | ICD-10-CM | POA: Diagnosis not present

## 2017-07-06 NOTE — Telephone Encounter (Signed)
Jose Heath Jose Heath states pt's foot doesn't look like it should, it has a mucousy drainage, odor, and pt doesn't have any pain, but will be out of his antibiotic soon, appt 07/08/2017 with Dr. Cannon Kettle. I left message with Jose Heath [facility] that I would inform Dr. Cannon Kettle and call pt to tell him if symptoms worsen to go to the ED. I spoke with pt and told him if he should have a fever, chills, sweats, increase swelling, or redness come out of the dressing he needed to go to the ED. Pt states understanding.

## 2017-07-07 NOTE — Telephone Encounter (Signed)
That mucous like product is likely the graft slowly absorbing. Do not wash this off. Replace steristrips and adaptic and I will re-assess when he comes to office this week Thanks Dr. Chauncey Cruel

## 2017-07-08 ENCOUNTER — Encounter: Payer: Self-pay | Admitting: Sports Medicine

## 2017-07-08 ENCOUNTER — Ambulatory Visit (INDEPENDENT_AMBULATORY_CARE_PROVIDER_SITE_OTHER): Payer: Medicare HMO | Admitting: Sports Medicine

## 2017-07-08 DIAGNOSIS — I739 Peripheral vascular disease, unspecified: Secondary | ICD-10-CM

## 2017-07-08 DIAGNOSIS — L97512 Non-pressure chronic ulcer of other part of right foot with fat layer exposed: Secondary | ICD-10-CM | POA: Diagnosis not present

## 2017-07-08 DIAGNOSIS — Z899 Acquired absence of limb, unspecified: Secondary | ICD-10-CM | POA: Diagnosis not present

## 2017-07-08 DIAGNOSIS — E1142 Type 2 diabetes mellitus with diabetic polyneuropathy: Secondary | ICD-10-CM | POA: Diagnosis not present

## 2017-07-08 MED ORDER — AMOXICILLIN-POT CLAVULANATE 875-125 MG PO TABS: 1.0000 | ORAL_TABLET | Freq: Two times a day (BID) | ORAL | 0 refills | Status: DC

## 2017-07-08 NOTE — Progress Notes (Signed)
Subjective: Jose Heath is a 67 y.o. male patient seen in office for postop visit #1 status post right foot wound debridement and placement of stravix graft performed 06/18/2017.  Patient states that he is doing fine he got scared and thought he had more infectious or double trouble taking his antibiotic. Patient states that he has home nursing every other day who is changing dressing and states that it seems like his nurse was worried so that is why he also doubled up on his antibiotic. Denies nausea/fever/vomiting/chills/night sweats/shortness of breath/pain. Patient has no other pedal complaints at this time.  Fasting blood sugar not recorded  Patient Active Problem List   Diagnosis Date Noted  . Arthritis 09/17/2016  . Atrial flutter ([place]) 09/17/2016  . Coronary artery disease 09/17/2016  . Diabetes mellitus ([place]) 09/17/2016  . Disease of thyroid gland 09/17/2016  . HOH (hard of hearing) 09/17/2016  . Hyperlipidemia 09/17/2016  . ICD (implantable cardioverter-defibrillator) in place 09/17/2016  . Left foot pain 09/17/2016  . Skin cancer 09/17/2016  . TSH (thyroid-stimulating hormone deficiency) 08/13/2016  . Anasarca 07/30/2016  . Charcot foot due to diabetes mellitus ([place]) 02/13/2016  . Atypical atrial flutter ([place]) 10/04/2015  . Essential hypertension 10/04/2015  . Ischemic cardiomyopathy 10/04/2015  . Pure hypercholesterolemia 10/04/2015  . Amputated toe of left foot ([place]) 08/15/2015  . Diabetic peripheral neuropathy ([place]) 08/15/2015  . Type 2 diabetes mellitus with Charcot's joint of left foot ([place]) 08/15/2015  . Cardiac defibrillator in place 05/29/2015  . Cardiomyopathy ([place]) 05/29/2015  . Dual ICD (implantable cardioverter-defibrillator) in place 05/29/2015   Current Outpatient Medications on File Prior to Visit  Medication Sig Dispense Refill  . amiodarone (PACERONE) 200 MG tablet Take 200 mg by mouth daily.  1  . apixaban (ELIQUIS) 5 MG TABS tablet Take 5 mg by  mouth.    Marland Kitchen atorvastatin (LIPITOR) 40 MG tablet TAKE 1 TABLET ONCE DAILY.    Marland Kitchen atorvastatin (LIPITOR) 40 MG tablet Take 40 mg by mouth daily.  0  . becaplermin (REGRANEX) 0.01 % gel Apply to affected area 3 times week. 15 g 3  . carvedilol (COREG) 6.25 MG tablet Take 6.25 mg by mouth 2 (two) times daily.  1  . doxycycline (VIBRAMYCIN) 100 MG capsule Take 1 capsule (100 mg total) by mouth 2 (two) times daily. 28 capsule 0  . ELIQUIS 2.5 MG TABS tablet TAKE ONE TABLET BY MOUTH TWICE DAILY FOR afib  0  . furosemide (LASIX) 40 MG tablet Take 40 mg by mouth 2 (two) times daily.  2  . glimepiride (AMARYL) 2 MG tablet Take 2 mg by mouth.    Colbert Ewing 1 g injection See admin instructions.  0  . IRON PO Take 325 mg by mouth.    . levofloxacin (LEVAQUIN) 500 MG tablet Take 1 tablet (500 mg total) by mouth daily. 14 tablet 0  . levothyroxine (SYNTHROID, LEVOTHROID) 100 MCG tablet Take by mouth.    . levothyroxine (SYNTHROID, LEVOTHROID) 100 MCG tablet Take 100 mcg by mouth every morning.  6  . levothyroxine (SYNTHROID, LEVOTHROID) 88 MCG tablet Take by mouth.    . levothyroxine (SYNTHROID, LEVOTHROID) 88 MCG tablet Take 88 mcg by mouth daily.  0  . Linezolid in Sodium Chloride 600-0.9 MG/300ML-% SOLN     . losartan (COZAAR) 25 MG tablet Take 25 mg by mouth.    . losartan (COZAAR) 50 MG tablet Take 50 mg by mouth.    . metFORMIN (GLUCOPHAGE) 1000 MG tablet Take  1,000 mg by mouth 2 (two) times daily with a meal.  0  . mupirocin ointment (BACTROBAN) 2 %     . simvastatin (ZOCOR) 20 MG tablet Take by mouth.    . spironolactone (ALDACTONE) 25 MG tablet Take 25 mg by mouth daily.  6  . torsemide (DEMADEX) 20 MG tablet TAKE 1 OR 2 TABLETS BY MOUTH EVERY DAY AS DIRECTED  6  . XYLOCAINE 1 % (with preservative) injection USE 3.50m with invanz FOR injection  0   No current facility-administered medications on file prior to visit.    Not on File  Recent Results (from the past 2160 hour(s))  WOUND CULTURE      Status: Abnormal   Collection Time: 06/17/17  1:53 PM  Result Value Ref Range   Gram Stain Result CANCELED     Comment: The specimen submitted does not meet the laboratory's criteria for acceptability. Refer to [facility] for specimen acceptability criteria. RECEIVE E SWAB 06/20/2017-[place]  Result canceled by the ancillary.    Aerobic Bacterial Culture Final report (A)    Organism ID, Bacteria Escherichia coli (A)     Comment: Heavy growth   Organism ID, Bacteria Mixed skin flora     Comment: Heavy growth   Antimicrobial Susceptibility Comment     Comment:       ** S = Susceptible; I = Intermediate; R = Resistant **                    P = Positive; N = Negative             MICS are expressed in micrograms per mL    Antibiotic                 RSLT#1    RSLT#2    RSLT#3    RSLT#4 Amoxicillin/Clavulanic Acid    S Ampicillin                     S Cefepime                       S Ceftriaxone                    S Cefuroxime                     S Ciprofloxacin                  S Ertapenem                      S Gentamicin                     S Imipenem                       S Levofloxacin                   S Meropenem                      S Piperacillin/Tazobactam        S Tetracycline                   S Tobramycin                     S Trimethoprim/Sulfa  S     Objective: There were no vitals filed for this visit.  General: Patient is awake, alert, oriented x 3 and in no acute distress.  Dermatology: Skin is warm and dry bilateral with a full thickness ulceration present at medial 1st MTPJ on right down to level of capsule and subcutaneous fat. Ulceration measures 3cm x 3cm x 0.4 cm. There is a minimally keratotic border with a 30:70 fibro-granular base. The ulceration does not  probe to bone but tunnels to a plantar wound sub met 1 that measures 0.5x0.5cm.  Sutures are intact to area where graft was sutured in place.  There is no malodor,  clear active drainage likely graft material, focal periwound erythema &  edema. No other acute signs of infection.   Vascular: Dorsalis Pedis pulse = 1/4 Bilateral,  Posterior Tibial pulse = 0/4 Bilateral,  Capillary Fill Time < 5 seconds  Neurologic: Protective sensation absent on right.  Musculosketal: There is no pain with palpation to ulcerated area. No pain with compression to calves bilateral. S/p Right 5th toe amputation secondary to gangrene and left fourth toe amputation from past.  No results for input(s): GRAMSTAIN, LABORGA in the last 8760 hours.  Assessment and Plan:  Problem List Items Addressed This Visit    None    Visit Diagnoses    Right foot ulcer, with fat layer exposed ([place])    -  Primary   PVD (peripheral vascular disease) ([place])       Diabetic polyneuropathy associated with type 2 diabetes mellitus ([place])       History of amputation          -Examined patient and discussed the progression of the wound and treatment alternatives. - Excisionally dedbrided ulceration at to bleeding borders removing nonviable tissue using a sterile chisel blade. Wound measures post debridement as above. Hemostasis was achieved with manuel pressure. Patient tolerated procedure well without any discomfort or anesthesia necessary for this wound debridement.  -Applied Adaptic and Steri-Strips and dry sterile dressing to right foot, nursing to continue with the same until next office visit -Refilled Augmentin antibiotic to take as instructed -Continue with postop shoe and limit weightbearing to necessity -Will proceed with possible reapplication of grafix at next office visit -Advised patient to call office or come in sooner if ulcerations worsen or fail to continue to improve -Patient to return in 7-10 days for follow-up wound care.  [name], DPM

## 2017-07-08 NOTE — Telephone Encounter (Signed)
I informed Jose Heath [facility] of Dr. Leeanne Rio orders of 07/07/2017 10:55pm. Jose Heath states everything looks good, but have not replaced the sloughing off steri-strips. I told Jose Heath I would call again with orders after pt's appt today.

## 2017-07-08 NOTE — Telephone Encounter (Signed)
Reapply adaptic and steristrips to wound and cover with dry guaze. Patient will have drainage as the graft is breaking down.  Also Marcy Siren if we can contact [facility] for Grafix PL for the patient. May apply at next visit. Medial right 1st toe wound measures 3x3cm and plantar sub met 1 ulcer measures 1x1cm and tunnels 1cm to medial right 1st toe wound Thanks Dr. Chauncey Cruel

## 2017-07-08 NOTE — Telephone Encounter (Signed)
Left message informing [name] [facility] of 07/08/2017 11:06am. Emailed order for Grafix for 07/17/2017 at 10:00am to [facility] - [facility].

## 2017-07-09 DIAGNOSIS — E1165 Type 2 diabetes mellitus with hyperglycemia: Secondary | ICD-10-CM | POA: Diagnosis not present

## 2017-07-09 DIAGNOSIS — Z6827 Body mass index (BMI) 27.0-27.9, adult: Secondary | ICD-10-CM | POA: Diagnosis not present

## 2017-07-09 DIAGNOSIS — E785 Hyperlipidemia, unspecified: Secondary | ICD-10-CM | POA: Diagnosis not present

## 2017-07-09 DIAGNOSIS — I951 Orthostatic hypotension: Secondary | ICD-10-CM | POA: Diagnosis not present

## 2017-07-09 DIAGNOSIS — D649 Anemia, unspecified: Secondary | ICD-10-CM | POA: Diagnosis not present

## 2017-07-10 DIAGNOSIS — Z79891 Long term (current) use of opiate analgesic: Secondary | ICD-10-CM | POA: Diagnosis not present

## 2017-07-10 DIAGNOSIS — I509 Heart failure, unspecified: Secondary | ICD-10-CM | POA: Diagnosis not present

## 2017-07-10 DIAGNOSIS — N183 Chronic kidney disease, stage 3 (moderate): Secondary | ICD-10-CM | POA: Diagnosis not present

## 2017-07-10 DIAGNOSIS — L97519 Non-pressure chronic ulcer of other part of right foot with unspecified severity: Secondary | ICD-10-CM | POA: Diagnosis not present

## 2017-07-10 DIAGNOSIS — I130 Hypertensive heart and chronic kidney disease with heart failure and stage 1 through stage 4 chronic kidney disease, or unspecified chronic kidney disease: Secondary | ICD-10-CM | POA: Diagnosis not present

## 2017-07-10 DIAGNOSIS — I2510 Atherosclerotic heart disease of native coronary artery without angina pectoris: Secondary | ICD-10-CM | POA: Diagnosis not present

## 2017-07-10 DIAGNOSIS — E11621 Type 2 diabetes mellitus with foot ulcer: Secondary | ICD-10-CM | POA: Diagnosis not present

## 2017-07-10 DIAGNOSIS — Z8614 Personal history of Methicillin resistant Staphylococcus aureus infection: Secondary | ICD-10-CM | POA: Diagnosis not present

## 2017-07-10 DIAGNOSIS — E1122 Type 2 diabetes mellitus with diabetic chronic kidney disease: Secondary | ICD-10-CM | POA: Diagnosis not present

## 2017-07-10 DIAGNOSIS — I482 Chronic atrial fibrillation: Secondary | ICD-10-CM | POA: Diagnosis not present

## 2017-07-13 DIAGNOSIS — E1122 Type 2 diabetes mellitus with diabetic chronic kidney disease: Secondary | ICD-10-CM | POA: Diagnosis not present

## 2017-07-13 DIAGNOSIS — I130 Hypertensive heart and chronic kidney disease with heart failure and stage 1 through stage 4 chronic kidney disease, or unspecified chronic kidney disease: Secondary | ICD-10-CM | POA: Diagnosis not present

## 2017-07-13 DIAGNOSIS — I2510 Atherosclerotic heart disease of native coronary artery without angina pectoris: Secondary | ICD-10-CM | POA: Diagnosis not present

## 2017-07-13 DIAGNOSIS — I482 Chronic atrial fibrillation: Secondary | ICD-10-CM | POA: Diagnosis not present

## 2017-07-13 DIAGNOSIS — E11621 Type 2 diabetes mellitus with foot ulcer: Secondary | ICD-10-CM | POA: Diagnosis not present

## 2017-07-13 DIAGNOSIS — I509 Heart failure, unspecified: Secondary | ICD-10-CM | POA: Diagnosis not present

## 2017-07-13 DIAGNOSIS — L97519 Non-pressure chronic ulcer of other part of right foot with unspecified severity: Secondary | ICD-10-CM | POA: Diagnosis not present

## 2017-07-13 DIAGNOSIS — N183 Chronic kidney disease, stage 3 (moderate): Secondary | ICD-10-CM | POA: Diagnosis not present

## 2017-07-13 DIAGNOSIS — Z79891 Long term (current) use of opiate analgesic: Secondary | ICD-10-CM | POA: Diagnosis not present

## 2017-07-13 DIAGNOSIS — Z8614 Personal history of Methicillin resistant Staphylococcus aureus infection: Secondary | ICD-10-CM | POA: Diagnosis not present

## 2017-07-15 DIAGNOSIS — I482 Chronic atrial fibrillation: Secondary | ICD-10-CM | POA: Diagnosis not present

## 2017-07-15 DIAGNOSIS — I509 Heart failure, unspecified: Secondary | ICD-10-CM | POA: Diagnosis not present

## 2017-07-15 DIAGNOSIS — Z8614 Personal history of Methicillin resistant Staphylococcus aureus infection: Secondary | ICD-10-CM | POA: Diagnosis not present

## 2017-07-15 DIAGNOSIS — I130 Hypertensive heart and chronic kidney disease with heart failure and stage 1 through stage 4 chronic kidney disease, or unspecified chronic kidney disease: Secondary | ICD-10-CM | POA: Diagnosis not present

## 2017-07-15 DIAGNOSIS — E1122 Type 2 diabetes mellitus with diabetic chronic kidney disease: Secondary | ICD-10-CM | POA: Diagnosis not present

## 2017-07-15 DIAGNOSIS — L97519 Non-pressure chronic ulcer of other part of right foot with unspecified severity: Secondary | ICD-10-CM | POA: Diagnosis not present

## 2017-07-15 DIAGNOSIS — N183 Chronic kidney disease, stage 3 (moderate): Secondary | ICD-10-CM | POA: Diagnosis not present

## 2017-07-15 DIAGNOSIS — I2510 Atherosclerotic heart disease of native coronary artery without angina pectoris: Secondary | ICD-10-CM | POA: Diagnosis not present

## 2017-07-15 DIAGNOSIS — Z79891 Long term (current) use of opiate analgesic: Secondary | ICD-10-CM | POA: Diagnosis not present

## 2017-07-15 DIAGNOSIS — E11621 Type 2 diabetes mellitus with foot ulcer: Secondary | ICD-10-CM | POA: Diagnosis not present

## 2017-07-17 ENCOUNTER — Encounter: Payer: Self-pay | Admitting: Sports Medicine

## 2017-07-17 ENCOUNTER — Ambulatory Visit (INDEPENDENT_AMBULATORY_CARE_PROVIDER_SITE_OTHER): Payer: Medicare HMO | Admitting: Sports Medicine

## 2017-07-17 ENCOUNTER — Telehealth: Payer: Self-pay | Admitting: *Deleted

## 2017-07-17 DIAGNOSIS — L97512 Non-pressure chronic ulcer of other part of right foot with fat layer exposed: Secondary | ICD-10-CM | POA: Diagnosis not present

## 2017-07-17 DIAGNOSIS — I482 Chronic atrial fibrillation: Secondary | ICD-10-CM | POA: Diagnosis not present

## 2017-07-17 DIAGNOSIS — E11621 Type 2 diabetes mellitus with foot ulcer: Secondary | ICD-10-CM | POA: Diagnosis not present

## 2017-07-17 DIAGNOSIS — I739 Peripheral vascular disease, unspecified: Secondary | ICD-10-CM

## 2017-07-17 DIAGNOSIS — N183 Chronic kidney disease, stage 3 (moderate): Secondary | ICD-10-CM | POA: Diagnosis not present

## 2017-07-17 DIAGNOSIS — E1142 Type 2 diabetes mellitus with diabetic polyneuropathy: Secondary | ICD-10-CM | POA: Diagnosis not present

## 2017-07-17 DIAGNOSIS — I2510 Atherosclerotic heart disease of native coronary artery without angina pectoris: Secondary | ICD-10-CM | POA: Diagnosis not present

## 2017-07-17 DIAGNOSIS — Z8614 Personal history of Methicillin resistant Staphylococcus aureus infection: Secondary | ICD-10-CM | POA: Diagnosis not present

## 2017-07-17 DIAGNOSIS — I130 Hypertensive heart and chronic kidney disease with heart failure and stage 1 through stage 4 chronic kidney disease, or unspecified chronic kidney disease: Secondary | ICD-10-CM | POA: Diagnosis not present

## 2017-07-17 DIAGNOSIS — Z899 Acquired absence of limb, unspecified: Secondary | ICD-10-CM | POA: Diagnosis not present

## 2017-07-17 DIAGNOSIS — L97519 Non-pressure chronic ulcer of other part of right foot with unspecified severity: Secondary | ICD-10-CM | POA: Diagnosis not present

## 2017-07-17 DIAGNOSIS — E1122 Type 2 diabetes mellitus with diabetic chronic kidney disease: Secondary | ICD-10-CM | POA: Diagnosis not present

## 2017-07-17 DIAGNOSIS — Z79891 Long term (current) use of opiate analgesic: Secondary | ICD-10-CM | POA: Diagnosis not present

## 2017-07-17 DIAGNOSIS — I509 Heart failure, unspecified: Secondary | ICD-10-CM | POA: Diagnosis not present

## 2017-07-17 NOTE — Telephone Encounter (Signed)
Faxed copy of Dr. Cannon Kettle 07/17/2017 10:05am orders to [facility].

## 2017-07-17 NOTE — Progress Notes (Signed)
Subjective: Cameren Earnest is a 67 y.o. male patient seen in office for postop visit #2 status post right foot wound debridement and placement of stravix graft performed 06/18/2017.  Patient states that he is doing ok. Has home nurse assisting with dressing changes. Denies nausea/fever/vomiting/chills/night sweats/shortness of breath/pain. Patient has no other pedal complaints at this time.  Fasting blood sugar not recorded  Patient Active Problem List   Diagnosis Date Noted  . Arthritis 09/17/2016  . Atrial flutter ([place]) 09/17/2016  . Coronary artery disease 09/17/2016  . Diabetes mellitus ([place]) 09/17/2016  . Disease of thyroid gland 09/17/2016  . HOH (hard of hearing) 09/17/2016  . Hyperlipidemia 09/17/2016  . ICD (implantable cardioverter-defibrillator) in place 09/17/2016  . Left foot pain 09/17/2016  . Skin cancer 09/17/2016  . TSH (thyroid-stimulating hormone deficiency) 08/13/2016  . Anasarca 07/30/2016  . Charcot foot due to diabetes mellitus ([place]) 02/13/2016  . Atypical atrial flutter ([place]) 10/04/2015  . Essential hypertension 10/04/2015  . Ischemic cardiomyopathy 10/04/2015  . Pure hypercholesterolemia 10/04/2015  . Amputated toe of left foot ([place]) 08/15/2015  . Diabetic peripheral neuropathy ([place]) 08/15/2015  . Type 2 diabetes mellitus with Charcot's joint of left foot ([place]) 08/15/2015  . Cardiac defibrillator in place 05/29/2015  . Cardiomyopathy ([place]) 05/29/2015  . Dual ICD (implantable cardioverter-defibrillator) in place 05/29/2015   Current Outpatient Medications on File Prior to Visit  Medication Sig Dispense Refill  . amiodarone (PACERONE) 200 MG tablet Take 200 mg by mouth daily.  1  . amoxicillin-clavulanate (AUGMENTIN) 875-125 MG tablet Take 1 tablet by mouth 2 (two) times daily. 28 tablet 0  . apixaban (ELIQUIS) 5 MG TABS tablet Take 5 mg by mouth.    Marland Kitchen atorvastatin (LIPITOR) 40 MG tablet TAKE 1 TABLET ONCE DAILY.    Marland Kitchen atorvastatin (LIPITOR) 40 MG tablet  Take 40 mg by mouth daily.  0  . becaplermin (REGRANEX) 0.01 % gel Apply to affected area 3 times week. 15 g 3  . carvedilol (COREG) 6.25 MG tablet Take 6.25 mg by mouth 2 (two) times daily.  1  . doxycycline (VIBRAMYCIN) 100 MG capsule Take 1 capsule (100 mg total) by mouth 2 (two) times daily. 28 capsule 0  . ELIQUIS 2.5 MG TABS tablet TAKE ONE TABLET BY MOUTH TWICE DAILY FOR afib  0  . furosemide (LASIX) 40 MG tablet Take 40 mg by mouth 2 (two) times daily.  2  . glimepiride (AMARYL) 2 MG tablet Take 2 mg by mouth.    Colbert Ewing 1 g injection See admin instructions.  0  . IRON PO Take 325 mg by mouth.    . levofloxacin (LEVAQUIN) 500 MG tablet Take 1 tablet (500 mg total) by mouth daily. 14 tablet 0  . levothyroxine (SYNTHROID, LEVOTHROID) 100 MCG tablet Take by mouth.    . levothyroxine (SYNTHROID, LEVOTHROID) 100 MCG tablet Take 100 mcg by mouth every morning.  6  . levothyroxine (SYNTHROID, LEVOTHROID) 88 MCG tablet Take by mouth.    . levothyroxine (SYNTHROID, LEVOTHROID) 88 MCG tablet Take 88 mcg by mouth daily.  0  . Linezolid in Sodium Chloride 600-0.9 MG/300ML-% SOLN     . losartan (COZAAR) 25 MG tablet Take 25 mg by mouth.    . losartan (COZAAR) 50 MG tablet Take 50 mg by mouth.    . metFORMIN (GLUCOPHAGE) 1000 MG tablet Take 1,000 mg by mouth 2 (two) times daily with a meal.  0  . mupirocin ointment (BACTROBAN) 2 %     .  simvastatin (ZOCOR) 20 MG tablet Take by mouth.    . spironolactone (ALDACTONE) 25 MG tablet Take 25 mg by mouth daily.  6  . torsemide (DEMADEX) 20 MG tablet TAKE 1 OR 2 TABLETS BY MOUTH EVERY DAY AS DIRECTED  6  . XYLOCAINE 1 % (with preservative) injection USE 3.76m with invanz FOR injection  0   No current facility-administered medications on file prior to visit.    Not on File  Recent Results (from the past 2160 hour(s))  WOUND CULTURE     Status: Abnormal   Collection Time: 06/17/17  1:53 PM  Result Value Ref Range   Gram Stain Result CANCELED      Comment: The specimen submitted does not meet the laboratory's criteria for acceptability. Refer to [facility] for specimen acceptability criteria. RECEIVE E SWAB 06/20/2017-[place]  Result canceled by the ancillary.    Aerobic Bacterial Culture Final report (A)    Organism ID, Bacteria Escherichia coli (A)     Comment: Heavy growth   Organism ID, Bacteria Mixed skin flora     Comment: Heavy growth   Antimicrobial Susceptibility Comment     Comment:       ** S = Susceptible; I = Intermediate; R = Resistant **                    P = Positive; N = Negative             MICS are expressed in micrograms per mL    Antibiotic                 RSLT#1    RSLT#2    RSLT#3    RSLT#4 Amoxicillin/Clavulanic Acid    S Ampicillin                     S Cefepime                       S Ceftriaxone                    S Cefuroxime                     S Ciprofloxacin                  S Ertapenem                      S Gentamicin                     S Imipenem                       S Levofloxacin                   S Meropenem                      S Piperacillin/Tazobactam        S Tetracycline                   S Tobramycin                     S Trimethoprim/Sulfa             S     Objective: There were no vitals filed for this visit.  General: Patient is awake, alert, oriented x 3 and in no acute distress.  Dermatology: Skin is warm and dry bilateral with a full thickness ulceration present at medial 1st MTPJ on right down to level of capsule and subcutaneous fat. Ulceration measures 3cm x 2.5cm x 0.4 cm. There is a minimally keratotic border with a 30:70 fibro-granular base. The ulceration does not  probe to bone but tunnels to a plantar wound sub met 1 that measures 1x1cm.  Sutures are intact to area where graft was sutured in place.  There is no malodor, clear active drainage likely graft material, focal periwound erythema &  edema. No other acute signs of infection.    Vascular: Dorsalis Pedis pulse = 1/4 Bilateral,  Posterior Tibial pulse = 0/4 Bilateral,  Capillary Fill Time < 5 seconds  Neurologic: Protective sensation absent on right.  Musculosketal: There is no pain with palpation to ulcerated area. No pain with compression to calves bilateral. S/p Right 5th toe amputation secondary to gangrene and left fourth toe amputation from past.  No results for input(s): GRAMSTAIN, LABORGA in the last 8760 hours.  Assessment and Plan:  Problem List Items Addressed This Visit    None    Visit Diagnoses    Right foot ulcer, with fat layer exposed ([place])    -  Primary   PVD (peripheral vascular disease) ([place])       Diabetic polyneuropathy associated with type 2 diabetes mellitus ([place])       History of amputation         -Examined patient and discussed the progression of the wound and treatment alternatives. - Excisionally dedbrided ulceration at to bleeding borders removing nonviable tissue using a sterile chisel blade. Wound measures post debridement as above. Hemostasis was achieved with manuel pressure. Patient tolerated procedure well without any discomfort or anesthesia necessary for this wound debridement.  -Applied Grafix PL 3 x 4 cm from cryopreserved membrane graft lot number [id number] serial #[id number] expiration May 26 2018 part number [id number] secured with adaptic and Steri-Strips with a layer of Prisma over top and dry sterile dressing to right foot, nursing to continue with the same (Prisma to site and replacing Adaptic as needed 3 times per week) until next office visit -Continue with Augmentin antibiotic to take as instructed until completed  -Continue with postop shoe and limit weightbearing to necessity -Advised patient to call office or come in sooner if ulcerations worsen or fail to continue to improve -Patient to return in 10-14 days for follow-up wound care/re-application of grafix.  [name], DPM

## 2017-07-17 NOTE — Telephone Encounter (Signed)
-----   Message from [name], [place] sent at 07/17/2017 10:05 AM EST ----- Regarding: Home Nursing and Reorder Grafix PL Reorder 3x4 for next visit  Home nursing to Right foot keep adpatic and steri-strips in place. If it comes off please replace to cover graft in wound bed and put PRISMA or collagen dressing for the drainage 3x per week all covered 4x4, abd, kerlix and ace wrap -Dr. Cannon Kettle

## 2017-07-20 DIAGNOSIS — I130 Hypertensive heart and chronic kidney disease with heart failure and stage 1 through stage 4 chronic kidney disease, or unspecified chronic kidney disease: Secondary | ICD-10-CM | POA: Diagnosis not present

## 2017-07-20 DIAGNOSIS — Z79891 Long term (current) use of opiate analgesic: Secondary | ICD-10-CM | POA: Diagnosis not present

## 2017-07-20 DIAGNOSIS — Z8614 Personal history of Methicillin resistant Staphylococcus aureus infection: Secondary | ICD-10-CM | POA: Diagnosis not present

## 2017-07-20 DIAGNOSIS — E1122 Type 2 diabetes mellitus with diabetic chronic kidney disease: Secondary | ICD-10-CM | POA: Diagnosis not present

## 2017-07-20 DIAGNOSIS — E11621 Type 2 diabetes mellitus with foot ulcer: Secondary | ICD-10-CM | POA: Diagnosis not present

## 2017-07-20 DIAGNOSIS — I2510 Atherosclerotic heart disease of native coronary artery without angina pectoris: Secondary | ICD-10-CM | POA: Diagnosis not present

## 2017-07-20 DIAGNOSIS — N183 Chronic kidney disease, stage 3 (moderate): Secondary | ICD-10-CM | POA: Diagnosis not present

## 2017-07-20 DIAGNOSIS — I509 Heart failure, unspecified: Secondary | ICD-10-CM | POA: Diagnosis not present

## 2017-07-20 DIAGNOSIS — I482 Chronic atrial fibrillation: Secondary | ICD-10-CM | POA: Diagnosis not present

## 2017-07-20 DIAGNOSIS — L97519 Non-pressure chronic ulcer of other part of right foot with unspecified severity: Secondary | ICD-10-CM | POA: Diagnosis not present

## 2017-07-22 DIAGNOSIS — Z8614 Personal history of Methicillin resistant Staphylococcus aureus infection: Secondary | ICD-10-CM | POA: Diagnosis not present

## 2017-07-22 DIAGNOSIS — I509 Heart failure, unspecified: Secondary | ICD-10-CM | POA: Diagnosis not present

## 2017-07-22 DIAGNOSIS — I482 Chronic atrial fibrillation: Secondary | ICD-10-CM | POA: Diagnosis not present

## 2017-07-22 DIAGNOSIS — N183 Chronic kidney disease, stage 3 (moderate): Secondary | ICD-10-CM | POA: Diagnosis not present

## 2017-07-22 DIAGNOSIS — I130 Hypertensive heart and chronic kidney disease with heart failure and stage 1 through stage 4 chronic kidney disease, or unspecified chronic kidney disease: Secondary | ICD-10-CM | POA: Diagnosis not present

## 2017-07-22 DIAGNOSIS — E11621 Type 2 diabetes mellitus with foot ulcer: Secondary | ICD-10-CM | POA: Diagnosis not present

## 2017-07-22 DIAGNOSIS — I2510 Atherosclerotic heart disease of native coronary artery without angina pectoris: Secondary | ICD-10-CM | POA: Diagnosis not present

## 2017-07-22 DIAGNOSIS — Z79891 Long term (current) use of opiate analgesic: Secondary | ICD-10-CM | POA: Diagnosis not present

## 2017-07-22 DIAGNOSIS — L97519 Non-pressure chronic ulcer of other part of right foot with unspecified severity: Secondary | ICD-10-CM | POA: Diagnosis not present

## 2017-07-22 DIAGNOSIS — E1122 Type 2 diabetes mellitus with diabetic chronic kidney disease: Secondary | ICD-10-CM | POA: Diagnosis not present

## 2017-07-24 DIAGNOSIS — I509 Heart failure, unspecified: Secondary | ICD-10-CM | POA: Diagnosis not present

## 2017-07-24 DIAGNOSIS — I2510 Atherosclerotic heart disease of native coronary artery without angina pectoris: Secondary | ICD-10-CM | POA: Diagnosis not present

## 2017-07-24 DIAGNOSIS — E1122 Type 2 diabetes mellitus with diabetic chronic kidney disease: Secondary | ICD-10-CM | POA: Diagnosis not present

## 2017-07-24 DIAGNOSIS — N183 Chronic kidney disease, stage 3 (moderate): Secondary | ICD-10-CM | POA: Diagnosis not present

## 2017-07-24 DIAGNOSIS — L97519 Non-pressure chronic ulcer of other part of right foot with unspecified severity: Secondary | ICD-10-CM | POA: Diagnosis not present

## 2017-07-24 DIAGNOSIS — E11621 Type 2 diabetes mellitus with foot ulcer: Secondary | ICD-10-CM | POA: Diagnosis not present

## 2017-07-24 DIAGNOSIS — Z8614 Personal history of Methicillin resistant Staphylococcus aureus infection: Secondary | ICD-10-CM | POA: Diagnosis not present

## 2017-07-24 DIAGNOSIS — Z79891 Long term (current) use of opiate analgesic: Secondary | ICD-10-CM | POA: Diagnosis not present

## 2017-07-24 DIAGNOSIS — I130 Hypertensive heart and chronic kidney disease with heart failure and stage 1 through stage 4 chronic kidney disease, or unspecified chronic kidney disease: Secondary | ICD-10-CM | POA: Diagnosis not present

## 2017-07-24 DIAGNOSIS — I482 Chronic atrial fibrillation: Secondary | ICD-10-CM | POA: Diagnosis not present

## 2017-07-27 ENCOUNTER — Telehealth: Payer: Self-pay | Admitting: *Deleted

## 2017-07-27 DIAGNOSIS — Z8614 Personal history of Methicillin resistant Staphylococcus aureus infection: Secondary | ICD-10-CM | POA: Diagnosis not present

## 2017-07-27 DIAGNOSIS — I130 Hypertensive heart and chronic kidney disease with heart failure and stage 1 through stage 4 chronic kidney disease, or unspecified chronic kidney disease: Secondary | ICD-10-CM | POA: Diagnosis not present

## 2017-07-27 DIAGNOSIS — N183 Chronic kidney disease, stage 3 (moderate): Secondary | ICD-10-CM | POA: Diagnosis not present

## 2017-07-27 DIAGNOSIS — Z79891 Long term (current) use of opiate analgesic: Secondary | ICD-10-CM | POA: Diagnosis not present

## 2017-07-27 DIAGNOSIS — E1122 Type 2 diabetes mellitus with diabetic chronic kidney disease: Secondary | ICD-10-CM | POA: Diagnosis not present

## 2017-07-27 DIAGNOSIS — I482 Chronic atrial fibrillation: Secondary | ICD-10-CM | POA: Diagnosis not present

## 2017-07-27 DIAGNOSIS — I2510 Atherosclerotic heart disease of native coronary artery without angina pectoris: Secondary | ICD-10-CM | POA: Diagnosis not present

## 2017-07-27 DIAGNOSIS — L97519 Non-pressure chronic ulcer of other part of right foot with unspecified severity: Secondary | ICD-10-CM | POA: Diagnosis not present

## 2017-07-27 DIAGNOSIS — E11621 Type 2 diabetes mellitus with foot ulcer: Secondary | ICD-10-CM | POA: Diagnosis not present

## 2017-07-27 DIAGNOSIS — I509 Heart failure, unspecified: Secondary | ICD-10-CM | POA: Diagnosis not present

## 2017-07-29 DIAGNOSIS — I130 Hypertensive heart and chronic kidney disease with heart failure and stage 1 through stage 4 chronic kidney disease, or unspecified chronic kidney disease: Secondary | ICD-10-CM | POA: Diagnosis not present

## 2017-07-29 DIAGNOSIS — E1122 Type 2 diabetes mellitus with diabetic chronic kidney disease: Secondary | ICD-10-CM | POA: Diagnosis not present

## 2017-07-29 DIAGNOSIS — L97519 Non-pressure chronic ulcer of other part of right foot with unspecified severity: Secondary | ICD-10-CM | POA: Diagnosis not present

## 2017-07-29 DIAGNOSIS — I482 Chronic atrial fibrillation: Secondary | ICD-10-CM | POA: Diagnosis not present

## 2017-07-29 DIAGNOSIS — I2510 Atherosclerotic heart disease of native coronary artery without angina pectoris: Secondary | ICD-10-CM | POA: Diagnosis not present

## 2017-07-29 DIAGNOSIS — I509 Heart failure, unspecified: Secondary | ICD-10-CM | POA: Diagnosis not present

## 2017-07-29 DIAGNOSIS — E11621 Type 2 diabetes mellitus with foot ulcer: Secondary | ICD-10-CM | POA: Diagnosis not present

## 2017-07-29 DIAGNOSIS — N183 Chronic kidney disease, stage 3 (moderate): Secondary | ICD-10-CM | POA: Diagnosis not present

## 2017-07-29 DIAGNOSIS — Z8614 Personal history of Methicillin resistant Staphylococcus aureus infection: Secondary | ICD-10-CM | POA: Diagnosis not present

## 2017-07-29 DIAGNOSIS — Z79891 Long term (current) use of opiate analgesic: Secondary | ICD-10-CM | POA: Diagnosis not present

## 2017-07-30 DIAGNOSIS — E1165 Type 2 diabetes mellitus with hyperglycemia: Secondary | ICD-10-CM | POA: Diagnosis not present

## 2017-07-30 DIAGNOSIS — D649 Anemia, unspecified: Secondary | ICD-10-CM | POA: Diagnosis not present

## 2017-07-30 DIAGNOSIS — Z6829 Body mass index (BMI) 29.0-29.9, adult: Secondary | ICD-10-CM | POA: Diagnosis not present

## 2017-07-30 DIAGNOSIS — E785 Hyperlipidemia, unspecified: Secondary | ICD-10-CM | POA: Diagnosis not present

## 2017-07-31 ENCOUNTER — Telehealth: Payer: Self-pay | Admitting: *Deleted

## 2017-07-31 ENCOUNTER — Ambulatory Visit (INDEPENDENT_AMBULATORY_CARE_PROVIDER_SITE_OTHER): Payer: Medicare HMO | Admitting: Sports Medicine

## 2017-07-31 ENCOUNTER — Encounter: Payer: Self-pay | Admitting: Sports Medicine

## 2017-07-31 DIAGNOSIS — I739 Peripheral vascular disease, unspecified: Secondary | ICD-10-CM

## 2017-07-31 DIAGNOSIS — L03031 Cellulitis of right toe: Secondary | ICD-10-CM

## 2017-07-31 DIAGNOSIS — L02611 Cutaneous abscess of right foot: Secondary | ICD-10-CM | POA: Diagnosis not present

## 2017-07-31 DIAGNOSIS — M86171 Other acute osteomyelitis, right ankle and foot: Secondary | ICD-10-CM

## 2017-07-31 DIAGNOSIS — Z899 Acquired absence of limb, unspecified: Secondary | ICD-10-CM

## 2017-07-31 DIAGNOSIS — L97512 Non-pressure chronic ulcer of other part of right foot with fat layer exposed: Secondary | ICD-10-CM

## 2017-07-31 DIAGNOSIS — E1142 Type 2 diabetes mellitus with diabetic polyneuropathy: Secondary | ICD-10-CM

## 2017-07-31 MED ORDER — LEVOFLOXACIN 500 MG PO TABS: 500.0000 mg | ORAL_TABLET | Freq: Every day | ORAL | 0 refills | Status: DC

## 2017-07-31 NOTE — Telephone Encounter (Signed)
Entered in error

## 2017-07-31 NOTE — Progress Notes (Signed)
Subjective: Jose Heath is a 67 y.o. male patient seen in office for postop visit #3 status post right foot wound debridement and placement of stravix graft performed 06/18/2017.  Patient states that he is doing ok, did a lot of walking at [place] and was late for appointment. Has home nurse assisting with dressing changes every other day. Denies nausea/fever/vomiting/chills/night sweats/shortness of breath/pain. Patient has no other pedal complaints at this time.  Fasting blood sugar not recorded  Patient Active Problem List   Diagnosis Date Noted  . Arthritis 09/17/2016  . Atrial flutter ([place]) 09/17/2016  . Coronary artery disease 09/17/2016  . Diabetes mellitus ([place]) 09/17/2016  . Disease of thyroid gland 09/17/2016  . HOH (hard of hearing) 09/17/2016  . Hyperlipidemia 09/17/2016  . ICD (implantable cardioverter-defibrillator) in place 09/17/2016  . Left foot pain 09/17/2016  . Skin cancer 09/17/2016  . TSH (thyroid-stimulating hormone deficiency) 08/13/2016  . Anasarca 07/30/2016  . Charcot foot due to diabetes mellitus ([place]) 02/13/2016  . Atypical atrial flutter ([place]) 10/04/2015  . Essential hypertension 10/04/2015  . Ischemic cardiomyopathy 10/04/2015  . Pure hypercholesterolemia 10/04/2015  . Amputated toe of left foot ([place]) 08/15/2015  . Diabetic peripheral neuropathy ([place]) 08/15/2015  . Type 2 diabetes mellitus with Charcot's joint of left foot ([place]) 08/15/2015  . Cardiac defibrillator in place 05/29/2015  . Cardiomyopathy ([place]) 05/29/2015  . Dual ICD (implantable cardioverter-defibrillator) in place 05/29/2015   Current Outpatient Medications on File Prior to Visit  Medication Sig Dispense Refill  . amiodarone (PACERONE) 200 MG tablet Take 200 mg by mouth daily.  1  . amoxicillin-clavulanate (AUGMENTIN) 875-125 MG tablet Take 1 tablet by mouth 2 (two) times daily. 28 tablet 0  . apixaban (ELIQUIS) 5 MG TABS tablet Take 5 mg by mouth.    Marland Kitchen atorvastatin (LIPITOR)  40 MG tablet TAKE 1 TABLET ONCE DAILY.    Marland Kitchen atorvastatin (LIPITOR) 40 MG tablet Take 40 mg by mouth daily.  0  . becaplermin (REGRANEX) 0.01 % gel Apply to affected area 3 times week. 15 g 3  . carvedilol (COREG) 6.25 MG tablet Take 6.25 mg by mouth 2 (two) times daily.  1  . doxycycline (VIBRAMYCIN) 100 MG capsule Take 1 capsule (100 mg total) by mouth 2 (two) times daily. 28 capsule 0  . ELIQUIS 2.5 MG TABS tablet TAKE ONE TABLET BY MOUTH TWICE DAILY FOR afib  0  . furosemide (LASIX) 40 MG tablet Take 40 mg by mouth 2 (two) times daily.  2  . glimepiride (AMARYL) 2 MG tablet Take 2 mg by mouth.    Colbert Ewing 1 g injection See admin instructions.  0  . IRON PO Take 325 mg by mouth.    . levothyroxine (SYNTHROID, LEVOTHROID) 100 MCG tablet Take by mouth.    . levothyroxine (SYNTHROID, LEVOTHROID) 100 MCG tablet Take 100 mcg by mouth every morning.  6  . levothyroxine (SYNTHROID, LEVOTHROID) 88 MCG tablet Take by mouth.    . levothyroxine (SYNTHROID, LEVOTHROID) 88 MCG tablet Take 88 mcg by mouth daily.  0  . Linezolid in Sodium Chloride 600-0.9 MG/300ML-% SOLN     . losartan (COZAAR) 25 MG tablet Take 25 mg by mouth.    . losartan (COZAAR) 50 MG tablet Take 50 mg by mouth.    . metFORMIN (GLUCOPHAGE) 1000 MG tablet Take 1,000 mg by mouth 2 (two) times daily with a meal.  0  . mupirocin ointment (BACTROBAN) 2 %     . simvastatin (  ZOCOR) 20 MG tablet Take by mouth.    . spironolactone (ALDACTONE) 25 MG tablet Take 25 mg by mouth daily.  6  . torsemide (DEMADEX) 20 MG tablet TAKE 1 OR 2 TABLETS BY MOUTH EVERY DAY AS DIRECTED  6  . XYLOCAINE 1 % (with preservative) injection USE 3.65ml with invanz FOR injection  0   No current facility-administered medications on file prior to visit.    Not on File  Recent Results (from the past 2160 hour(s))  WOUND CULTURE     Status: Abnormal   Collection Time: 06/17/17  1:53 PM  Result Value Ref Range   Gram Stain Result CANCELED     Comment: The  specimen submitted does not meet the laboratory's criteria for acceptability. Refer to [facility] for specimen acceptability criteria. RECEIVE E SWAB 06/20/2017-[place]  Result canceled by the ancillary.    Aerobic Bacterial Culture Final report (A)    Organism ID, Bacteria Escherichia coli (A)     Comment: Heavy growth   Organism ID, Bacteria Mixed skin flora     Comment: Heavy growth   Antimicrobial Susceptibility Comment     Comment:       ** S = Susceptible; I = Intermediate; R = Resistant **                    P = Positive; N = Negative             MICS are expressed in micrograms per mL    Antibiotic                 RSLT#1    RSLT#2    RSLT#3    RSLT#4 Amoxicillin/Clavulanic Acid    S Ampicillin                     S Cefepime                       S Ceftriaxone                    S Cefuroxime                     S Ciprofloxacin                  S Ertapenem                      S Gentamicin                     S Imipenem                       S Levofloxacin                   S Meropenem                      S Piperacillin/Tazobactam        S Tetracycline                   S Tobramycin                     S Trimethoprim/Sulfa             S     Objective: There were no vitals filed for this visit.  General:  Patient is awake, alert, oriented x 3 and in no acute distress.  Dermatology: Skin is warm and dry bilateral with a full thickness ulceration present at medial 1st MTPJ on right down to level of capsule and subcutaneous fat medially extending plantarly, Ulceration measures 3cm x 2.4cm x 0.4 cm. There is a minimally keratotic border with a 30:70 fibro-granular base and maceration with green drainage. The ulceration does probe to bone at the platar aspect.  Sutures are intact to area where graft was sutured in place medially.  There is faint malodor, focal periwound erythema &  edema. No other acute signs of infection.   Vascular: Dorsalis Pedis  pulse = 1/4 Bilateral,  Posterior Tibial pulse = 0/4 Bilateral,  Capillary Fill Time < 5 seconds  Neurologic: Protective sensation absent on right.  Musculosketal: There is no pain with palpation to ulcerated area. No pain with compression to calves bilateral. S/p Right 5th toe amputation secondary to gangrene and left fourth toe amputation from past.  No results for input(s): GRAMSTAIN, LABORGA in the last 8760 hours.  Assessment and Plan:  Problem List Items Addressed This Visit    None    Visit Diagnoses    Cellulitis and abscess of toe of right foot    -  Primary   Relevant Medications   levofloxacin (LEVAQUIN) 500 MG tablet   Right foot ulcer, with fat layer exposed ([place])       Relevant Medications   levofloxacin (LEVAQUIN) 500 MG tablet   PVD (peripheral vascular disease) ([place])       Diabetic polyneuropathy associated with type 2 diabetes mellitus ([place])       History of amputation       Acute osteomyelitis of right ankle or foot ([facility])       Relevant Medications   levofloxacin (LEVAQUIN) 500 MG tablet     -Examined patient and discussed the progression of the wound and treatment alternatives. - Excisionally dedbrided ulceration at to bleeding borders removing nonviable tissue using a sterile chisel blade. Wound measures post debridement as above. Hemostasis was achieved with manuel pressure. Patient tolerated procedure well without any discomfort or anesthesia necessary for this wound debridement.  -Applied betadine to periphery of wound and covered wound bed with adaptic, nursing to remove adaptic and use Sorbact or a similar nonadherent antimicrobial dressing that will wick away drainage from the site -Rx Levaquin antibiotic to take as instructed until completed  -Continue with postop shoe and limit weightbearing to necessity -Advised patient to call office or come in sooner if ulcerations worsen or fail to continue to improve -Patient to return in 10-14 days for follow-up  wound care/ will order possible wound vac and discuss if we can re-apply grafix since it was not applied today due to the green drainage.   [name], DPM

## 2017-07-31 NOTE — Telephone Encounter (Signed)
Faxed copy of 07/31/2017 11:06am orders to [facility].

## 2017-07-31 NOTE — Telephone Encounter (Signed)
-----   Message from [name], [place] sent at 07/31/2017 11:06 AM EST ----- Regarding: Wound care orders Please change dressings 3x per week. Remove adaptic and apply sorbact nonadherent antimicrobial dressing 3x per week, if you do not have this then any nonadherent dressing would work to help wick away drainage from the wound while protecting any of the graft that is still stitched in place. Will consider if patient can get a wound vac at next visit because now at the bottom wound I can see the bone. Thanks Dr. Cannon Kettle

## 2017-08-03 DIAGNOSIS — I2510 Atherosclerotic heart disease of native coronary artery without angina pectoris: Secondary | ICD-10-CM | POA: Diagnosis not present

## 2017-08-03 DIAGNOSIS — E1122 Type 2 diabetes mellitus with diabetic chronic kidney disease: Secondary | ICD-10-CM | POA: Diagnosis not present

## 2017-08-03 DIAGNOSIS — L97519 Non-pressure chronic ulcer of other part of right foot with unspecified severity: Secondary | ICD-10-CM | POA: Diagnosis not present

## 2017-08-03 DIAGNOSIS — I482 Chronic atrial fibrillation: Secondary | ICD-10-CM | POA: Diagnosis not present

## 2017-08-03 DIAGNOSIS — I509 Heart failure, unspecified: Secondary | ICD-10-CM | POA: Diagnosis not present

## 2017-08-03 DIAGNOSIS — I130 Hypertensive heart and chronic kidney disease with heart failure and stage 1 through stage 4 chronic kidney disease, or unspecified chronic kidney disease: Secondary | ICD-10-CM | POA: Diagnosis not present

## 2017-08-03 DIAGNOSIS — Z79891 Long term (current) use of opiate analgesic: Secondary | ICD-10-CM | POA: Diagnosis not present

## 2017-08-03 DIAGNOSIS — N183 Chronic kidney disease, stage 3 (moderate): Secondary | ICD-10-CM | POA: Diagnosis not present

## 2017-08-03 DIAGNOSIS — Z8614 Personal history of Methicillin resistant Staphylococcus aureus infection: Secondary | ICD-10-CM | POA: Diagnosis not present

## 2017-08-03 DIAGNOSIS — E11621 Type 2 diabetes mellitus with foot ulcer: Secondary | ICD-10-CM | POA: Diagnosis not present

## 2017-08-05 DIAGNOSIS — N183 Chronic kidney disease, stage 3 (moderate): Secondary | ICD-10-CM | POA: Diagnosis not present

## 2017-08-05 DIAGNOSIS — I509 Heart failure, unspecified: Secondary | ICD-10-CM | POA: Diagnosis not present

## 2017-08-05 DIAGNOSIS — L97519 Non-pressure chronic ulcer of other part of right foot with unspecified severity: Secondary | ICD-10-CM | POA: Diagnosis not present

## 2017-08-05 DIAGNOSIS — Z79891 Long term (current) use of opiate analgesic: Secondary | ICD-10-CM | POA: Diagnosis not present

## 2017-08-05 DIAGNOSIS — E1122 Type 2 diabetes mellitus with diabetic chronic kidney disease: Secondary | ICD-10-CM | POA: Diagnosis not present

## 2017-08-05 DIAGNOSIS — Z8614 Personal history of Methicillin resistant Staphylococcus aureus infection: Secondary | ICD-10-CM | POA: Diagnosis not present

## 2017-08-05 DIAGNOSIS — E11621 Type 2 diabetes mellitus with foot ulcer: Secondary | ICD-10-CM | POA: Diagnosis not present

## 2017-08-05 DIAGNOSIS — I482 Chronic atrial fibrillation: Secondary | ICD-10-CM | POA: Diagnosis not present

## 2017-08-05 DIAGNOSIS — I2510 Atherosclerotic heart disease of native coronary artery without angina pectoris: Secondary | ICD-10-CM | POA: Diagnosis not present

## 2017-08-05 DIAGNOSIS — I130 Hypertensive heart and chronic kidney disease with heart failure and stage 1 through stage 4 chronic kidney disease, or unspecified chronic kidney disease: Secondary | ICD-10-CM | POA: Diagnosis not present

## 2017-08-07 DIAGNOSIS — L97519 Non-pressure chronic ulcer of other part of right foot with unspecified severity: Secondary | ICD-10-CM | POA: Diagnosis not present

## 2017-08-07 DIAGNOSIS — I2510 Atherosclerotic heart disease of native coronary artery without angina pectoris: Secondary | ICD-10-CM | POA: Diagnosis not present

## 2017-08-07 DIAGNOSIS — E1122 Type 2 diabetes mellitus with diabetic chronic kidney disease: Secondary | ICD-10-CM | POA: Diagnosis not present

## 2017-08-07 DIAGNOSIS — I509 Heart failure, unspecified: Secondary | ICD-10-CM | POA: Diagnosis not present

## 2017-08-07 DIAGNOSIS — E11621 Type 2 diabetes mellitus with foot ulcer: Secondary | ICD-10-CM | POA: Diagnosis not present

## 2017-08-07 DIAGNOSIS — Z79891 Long term (current) use of opiate analgesic: Secondary | ICD-10-CM | POA: Diagnosis not present

## 2017-08-07 DIAGNOSIS — I482 Chronic atrial fibrillation: Secondary | ICD-10-CM | POA: Diagnosis not present

## 2017-08-07 DIAGNOSIS — N183 Chronic kidney disease, stage 3 (moderate): Secondary | ICD-10-CM | POA: Diagnosis not present

## 2017-08-07 DIAGNOSIS — I130 Hypertensive heart and chronic kidney disease with heart failure and stage 1 through stage 4 chronic kidney disease, or unspecified chronic kidney disease: Secondary | ICD-10-CM | POA: Diagnosis not present

## 2017-08-07 DIAGNOSIS — Z8614 Personal history of Methicillin resistant Staphylococcus aureus infection: Secondary | ICD-10-CM | POA: Diagnosis not present

## 2017-08-10 DIAGNOSIS — I509 Heart failure, unspecified: Secondary | ICD-10-CM | POA: Diagnosis not present

## 2017-08-10 DIAGNOSIS — L97519 Non-pressure chronic ulcer of other part of right foot with unspecified severity: Secondary | ICD-10-CM | POA: Diagnosis not present

## 2017-08-10 DIAGNOSIS — I130 Hypertensive heart and chronic kidney disease with heart failure and stage 1 through stage 4 chronic kidney disease, or unspecified chronic kidney disease: Secondary | ICD-10-CM | POA: Diagnosis not present

## 2017-08-10 DIAGNOSIS — I2510 Atherosclerotic heart disease of native coronary artery without angina pectoris: Secondary | ICD-10-CM | POA: Diagnosis not present

## 2017-08-10 DIAGNOSIS — E11621 Type 2 diabetes mellitus with foot ulcer: Secondary | ICD-10-CM | POA: Diagnosis not present

## 2017-08-10 DIAGNOSIS — Z79891 Long term (current) use of opiate analgesic: Secondary | ICD-10-CM | POA: Diagnosis not present

## 2017-08-10 DIAGNOSIS — E1122 Type 2 diabetes mellitus with diabetic chronic kidney disease: Secondary | ICD-10-CM | POA: Diagnosis not present

## 2017-08-10 DIAGNOSIS — I482 Chronic atrial fibrillation: Secondary | ICD-10-CM | POA: Diagnosis not present

## 2017-08-10 DIAGNOSIS — Z8614 Personal history of Methicillin resistant Staphylococcus aureus infection: Secondary | ICD-10-CM | POA: Diagnosis not present

## 2017-08-10 DIAGNOSIS — N183 Chronic kidney disease, stage 3 (moderate): Secondary | ICD-10-CM | POA: Diagnosis not present

## 2017-08-12 DIAGNOSIS — N183 Chronic kidney disease, stage 3 (moderate): Secondary | ICD-10-CM | POA: Diagnosis not present

## 2017-08-12 DIAGNOSIS — Z8614 Personal history of Methicillin resistant Staphylococcus aureus infection: Secondary | ICD-10-CM | POA: Diagnosis not present

## 2017-08-12 DIAGNOSIS — I482 Chronic atrial fibrillation: Secondary | ICD-10-CM | POA: Diagnosis not present

## 2017-08-12 DIAGNOSIS — E1122 Type 2 diabetes mellitus with diabetic chronic kidney disease: Secondary | ICD-10-CM | POA: Diagnosis not present

## 2017-08-12 DIAGNOSIS — I2510 Atherosclerotic heart disease of native coronary artery without angina pectoris: Secondary | ICD-10-CM | POA: Diagnosis not present

## 2017-08-12 DIAGNOSIS — I509 Heart failure, unspecified: Secondary | ICD-10-CM | POA: Diagnosis not present

## 2017-08-12 DIAGNOSIS — I130 Hypertensive heart and chronic kidney disease with heart failure and stage 1 through stage 4 chronic kidney disease, or unspecified chronic kidney disease: Secondary | ICD-10-CM | POA: Diagnosis not present

## 2017-08-12 DIAGNOSIS — Z79891 Long term (current) use of opiate analgesic: Secondary | ICD-10-CM | POA: Diagnosis not present

## 2017-08-12 DIAGNOSIS — L97519 Non-pressure chronic ulcer of other part of right foot with unspecified severity: Secondary | ICD-10-CM | POA: Diagnosis not present

## 2017-08-12 DIAGNOSIS — E11621 Type 2 diabetes mellitus with foot ulcer: Secondary | ICD-10-CM | POA: Diagnosis not present

## 2017-08-14 ENCOUNTER — Telehealth: Payer: Self-pay | Admitting: *Deleted

## 2017-08-14 ENCOUNTER — Ambulatory Visit (INDEPENDENT_AMBULATORY_CARE_PROVIDER_SITE_OTHER): Payer: Medicare HMO | Admitting: Sports Medicine

## 2017-08-14 ENCOUNTER — Encounter: Payer: Self-pay | Admitting: Sports Medicine

## 2017-08-14 DIAGNOSIS — L97512 Non-pressure chronic ulcer of other part of right foot with fat layer exposed: Secondary | ICD-10-CM

## 2017-08-14 DIAGNOSIS — Z899 Acquired absence of limb, unspecified: Secondary | ICD-10-CM

## 2017-08-14 DIAGNOSIS — E1142 Type 2 diabetes mellitus with diabetic polyneuropathy: Secondary | ICD-10-CM

## 2017-08-14 DIAGNOSIS — I739 Peripheral vascular disease, unspecified: Secondary | ICD-10-CM

## 2017-08-14 DIAGNOSIS — L03031 Cellulitis of right toe: Secondary | ICD-10-CM

## 2017-08-14 DIAGNOSIS — L02611 Cutaneous abscess of right foot: Secondary | ICD-10-CM

## 2017-08-14 MED ORDER — DELAFLOXACIN MEGLUMINE 450 MG PO TABS: 1.0000 | ORAL_TABLET | Freq: Two times a day (BID) | ORAL | 0 refills | Status: DC

## 2017-08-14 NOTE — Telephone Encounter (Signed)
Faxed copy of 08/14/2017 12:30pm orders to [facility] [address]

## 2017-08-14 NOTE — Progress Notes (Signed)
Subjective: Jose Heath is a 67 y.o. male patient seen in office for postop visit #4 status post right foot wound debridement and placement of stravix graft performed 06/18/2017.  Patient reports more drainage and does not think that his wound is getting better. Has home nurse assisting with dressing changes every other day. Denies nausea/fever/vomiting/chills/night sweats/shortness of breath/pain.  Patient reports that he is trying to stay off his foot has been using his postop shoe.  Patient has no other pedal complaints at this time.  Fasting blood sugar not recorded  Patient Active Problem List   Diagnosis Date Noted  . Arthritis 09/17/2016  . Atrial flutter ([place]) 09/17/2016  . Coronary artery disease 09/17/2016  . Diabetes mellitus ([place]) 09/17/2016  . Disease of thyroid gland 09/17/2016  . HOH (hard of hearing) 09/17/2016  . Hyperlipidemia 09/17/2016  . ICD (implantable cardioverter-defibrillator) in place 09/17/2016  . Left foot pain 09/17/2016  . Skin cancer 09/17/2016  . TSH (thyroid-stimulating hormone deficiency) 08/13/2016  . Anasarca 07/30/2016  . Charcot foot due to diabetes mellitus ([place]) 02/13/2016  . Atypical atrial flutter ([place]) 10/04/2015  . Essential hypertension 10/04/2015  . Ischemic cardiomyopathy 10/04/2015  . Pure hypercholesterolemia 10/04/2015  . Amputated toe of left foot ([place]) 08/15/2015  . Diabetic peripheral neuropathy ([place]) 08/15/2015  . Type 2 diabetes mellitus with Charcot's joint of left foot ([place]) 08/15/2015  . Cardiac defibrillator in place 05/29/2015  . Cardiomyopathy ([place]) 05/29/2015  . Dual ICD (implantable cardioverter-defibrillator) in place 05/29/2015   Current Outpatient Medications on File Prior to Visit  Medication Sig Dispense Refill  . amiodarone (PACERONE) 200 MG tablet Take 200 mg by mouth daily.  1  . amoxicillin-clavulanate (AUGMENTIN) 875-125 MG tablet Take 1 tablet by mouth 2 (two) times daily. 28 tablet 0  . apixaban  (ELIQUIS) 5 MG TABS tablet Take 5 mg by mouth.    Marland Kitchen atorvastatin (LIPITOR) 40 MG tablet TAKE 1 TABLET ONCE DAILY.    Marland Kitchen atorvastatin (LIPITOR) 40 MG tablet Take 40 mg by mouth daily.  0  . becaplermin (REGRANEX) 0.01 % gel Apply to affected area 3 times week. 15 g 3  . carvedilol (COREG) 6.25 MG tablet Take 6.25 mg by mouth 2 (two) times daily.  1  . doxycycline (VIBRAMYCIN) 100 MG capsule Take 1 capsule (100 mg total) by mouth 2 (two) times daily. 28 capsule 0  . ELIQUIS 2.5 MG TABS tablet TAKE ONE TABLET BY MOUTH TWICE DAILY FOR afib  0  . furosemide (LASIX) 40 MG tablet Take 40 mg by mouth 2 (two) times daily.  2  . glimepiride (AMARYL) 2 MG tablet Take 2 mg by mouth.    Colbert Ewing 1 g injection See admin instructions.  0  . IRON PO Take 325 mg by mouth.    . levofloxacin (LEVAQUIN) 500 MG tablet Take 1 tablet (500 mg total) by mouth daily. 10 tablet 0  . levothyroxine (SYNTHROID, LEVOTHROID) 100 MCG tablet Take by mouth.    . levothyroxine (SYNTHROID, LEVOTHROID) 100 MCG tablet Take 100 mcg by mouth every morning.  6  . levothyroxine (SYNTHROID, LEVOTHROID) 88 MCG tablet Take by mouth.    . levothyroxine (SYNTHROID, LEVOTHROID) 88 MCG tablet Take 88 mcg by mouth daily.  0  . Linezolid in Sodium Chloride 600-0.9 MG/300ML-% SOLN     . losartan (COZAAR) 25 MG tablet Take 25 mg by mouth.    . losartan (COZAAR) 50 MG tablet Take 50 mg by mouth.    . metFORMIN (  GLUCOPHAGE) 1000 MG tablet Take 1,000 mg by mouth 2 (two) times daily with a meal.  0  . mupirocin ointment (BACTROBAN) 2 %     . simvastatin (ZOCOR) 20 MG tablet Take by mouth.    . spironolactone (ALDACTONE) 25 MG tablet Take 25 mg by mouth daily.  6  . torsemide (DEMADEX) 20 MG tablet TAKE 1 OR 2 TABLETS BY MOUTH EVERY DAY AS DIRECTED  6  . XYLOCAINE 1 % (with preservative) injection USE 3.16ml with invanz FOR injection  0   No current facility-administered medications on file prior to visit.    Not on File  Recent Results (from the  past 2160 hour(s))  WOUND CULTURE     Status: Abnormal   Collection Time: 06/17/17  1:53 PM  Result Value Ref Range   Gram Stain Result CANCELED     Comment: The specimen submitted does not meet the laboratory's criteria for acceptability. Refer to [employer] [facility] Services for specimen acceptability criteria. RECEIVE E SWAB 06/20/2017-[place]  Result canceled by the ancillary.    Aerobic Bacterial Culture Final report (A)    Organism ID, Bacteria Escherichia coli (A)     Comment: Heavy growth   Organism ID, Bacteria Mixed skin flora     Comment: Heavy growth   Antimicrobial Susceptibility Comment     Comment:       ** S = Susceptible; I = Intermediate; R = Resistant **                    P = Positive; N = Negative             MICS are expressed in micrograms per mL    Antibiotic                 RSLT#1    RSLT#2    RSLT#3    RSLT#4 Amoxicillin/Clavulanic Acid    S Ampicillin                     S Cefepime                       S Ceftriaxone                    S Cefuroxime                     S Ciprofloxacin                  S Ertapenem                      S Gentamicin                     S Imipenem                       S Levofloxacin                   S Meropenem                      S Piperacillin/Tazobactam        S Tetracycline                   S Tobramycin  S Trimethoprim/Sulfa             S     Objective: There were no vitals filed for this visit.  General: Patient is awake, alert, oriented x 3 and in no acute distress.  Dermatology: Skin is warm and dry bilateral with a full thickness ulceration present at medial 1st MTPJ on right down to level of capsule and subcutaneous fat medially extending plantarly, Ulceration measures 3.5cm x 3.2 cm x 0.4 cm. There is a minimally keratotic border with a 50:50 fibro-granular base and maceration with continued green drainage. The ulceration does probe to bone at the plantar aspect.  Sutures are  intact to area where graft was sutured in place medially.  There is faint malodor, focal periwound erythema &  edema. No other acute signs of infection.   Vascular: Dorsalis Pedis pulse = 1/4 Bilateral,  Posterior Tibial pulse = 0/4 Bilateral,  Capillary Fill Time < 5 seconds  Neurologic: Protective sensation absent on right.  Musculosketal: There is no pain with palpation to ulcerated area. No pain with compression to calves bilateral. S/p Right 5th toe amputation secondary to gangrene and left fourth toe amputation from past.  No results for input(s): GRAMSTAIN, LABORGA in the last 8760 hours.  Assessment and Plan:  Problem List Items Addressed This Visit    None    Visit Diagnoses    Right foot ulcer, with fat layer exposed ([place])    -  Primary   PVD (peripheral vascular disease) ([place])       Diabetic polyneuropathy associated with type 2 diabetes mellitus ([place])       History of amputation       Cellulitis and abscess of toe of right foot       Relevant Medications   Delafloxacin Meglumine (BAXDELA) 450 MG TABS     -Examined patient and discussed the progression of the wound and treatment alternatives. -Cleanse ulceration and applied betadine to periphery of wound and covered wound bed with Prisma Ag, nursing to do the same 3 times per week -Rx Baxdela antibiotic to take as instructed until completed for continued wound infection around graft site since patient  may no additional progress with previous oral antibiotic -Continue with postop shoe and limit weightbearing to necessity -Advised patient to call office or come in sooner if ulcerations worsen or fail to continue to improve -Patient to return in 10-14 days for follow-up wound care/ will x-ray and order possible wound vac and discuss if we can re-apply grafix since it was not applied today due to the green drainage if area appears to be improving after he has completed his oral antibiotics.   [name], DPM

## 2017-08-14 NOTE — Telephone Encounter (Signed)
-----   Message from [place], [place] sent at 08/14/2017 12:30 PM EST ----- Regarding: [place] to apply Prisma Ag or similar collagen absorbent dressing to right foot ulcer covered with dry sterile dressing 3 times per week -Dr. Cannon Kettle

## 2017-08-17 ENCOUNTER — Other Ambulatory Visit: Payer: Self-pay | Admitting: Sports Medicine

## 2017-08-17 DIAGNOSIS — Z8614 Personal history of Methicillin resistant Staphylococcus aureus infection: Secondary | ICD-10-CM | POA: Diagnosis not present

## 2017-08-17 DIAGNOSIS — E11621 Type 2 diabetes mellitus with foot ulcer: Secondary | ICD-10-CM | POA: Diagnosis not present

## 2017-08-17 DIAGNOSIS — I482 Chronic atrial fibrillation: Secondary | ICD-10-CM | POA: Diagnosis not present

## 2017-08-17 DIAGNOSIS — I509 Heart failure, unspecified: Secondary | ICD-10-CM | POA: Diagnosis not present

## 2017-08-17 DIAGNOSIS — M86171 Other acute osteomyelitis, right ankle and foot: Secondary | ICD-10-CM

## 2017-08-17 DIAGNOSIS — I2510 Atherosclerotic heart disease of native coronary artery without angina pectoris: Secondary | ICD-10-CM | POA: Diagnosis not present

## 2017-08-17 DIAGNOSIS — N183 Chronic kidney disease, stage 3 (moderate): Secondary | ICD-10-CM | POA: Diagnosis not present

## 2017-08-17 DIAGNOSIS — I130 Hypertensive heart and chronic kidney disease with heart failure and stage 1 through stage 4 chronic kidney disease, or unspecified chronic kidney disease: Secondary | ICD-10-CM | POA: Diagnosis not present

## 2017-08-17 DIAGNOSIS — E1122 Type 2 diabetes mellitus with diabetic chronic kidney disease: Secondary | ICD-10-CM | POA: Diagnosis not present

## 2017-08-17 DIAGNOSIS — Z79891 Long term (current) use of opiate analgesic: Secondary | ICD-10-CM | POA: Diagnosis not present

## 2017-08-17 DIAGNOSIS — L97519 Non-pressure chronic ulcer of other part of right foot with unspecified severity: Secondary | ICD-10-CM | POA: Diagnosis not present

## 2017-08-17 DIAGNOSIS — L97512 Non-pressure chronic ulcer of other part of right foot with fat layer exposed: Secondary | ICD-10-CM

## 2017-08-17 DIAGNOSIS — L02611 Cutaneous abscess of right foot: Secondary | ICD-10-CM

## 2017-08-17 DIAGNOSIS — L03031 Cellulitis of right toe: Secondary | ICD-10-CM

## 2017-08-17 MED ORDER — LEVOFLOXACIN 500 MG PO TABS: 500.0000 mg | ORAL_TABLET | Freq: Every day | ORAL | 0 refills | Status: DC

## 2017-08-17 NOTE — Progress Notes (Signed)
Patient states that he could not get the [place] medication. I refilled Levaquin for him to take for now. -Dr. Cannon Kettle

## 2017-08-19 ENCOUNTER — Telehealth: Payer: Self-pay | Admitting: Sports Medicine

## 2017-08-19 DIAGNOSIS — N183 Chronic kidney disease, stage 3 (moderate): Secondary | ICD-10-CM | POA: Diagnosis not present

## 2017-08-19 DIAGNOSIS — I130 Hypertensive heart and chronic kidney disease with heart failure and stage 1 through stage 4 chronic kidney disease, or unspecified chronic kidney disease: Secondary | ICD-10-CM | POA: Diagnosis not present

## 2017-08-19 DIAGNOSIS — I2510 Atherosclerotic heart disease of native coronary artery without angina pectoris: Secondary | ICD-10-CM | POA: Diagnosis not present

## 2017-08-19 DIAGNOSIS — I509 Heart failure, unspecified: Secondary | ICD-10-CM | POA: Diagnosis not present

## 2017-08-19 DIAGNOSIS — Z79891 Long term (current) use of opiate analgesic: Secondary | ICD-10-CM | POA: Diagnosis not present

## 2017-08-19 DIAGNOSIS — I482 Chronic atrial fibrillation: Secondary | ICD-10-CM | POA: Diagnosis not present

## 2017-08-19 DIAGNOSIS — E1122 Type 2 diabetes mellitus with diabetic chronic kidney disease: Secondary | ICD-10-CM | POA: Diagnosis not present

## 2017-08-19 DIAGNOSIS — L97519 Non-pressure chronic ulcer of other part of right foot with unspecified severity: Secondary | ICD-10-CM | POA: Diagnosis not present

## 2017-08-19 DIAGNOSIS — Z8614 Personal history of Methicillin resistant Staphylococcus aureus infection: Secondary | ICD-10-CM | POA: Diagnosis not present

## 2017-08-19 DIAGNOSIS — E11621 Type 2 diabetes mellitus with foot ulcer: Secondary | ICD-10-CM | POA: Diagnosis not present

## 2017-08-19 NOTE — Telephone Encounter (Signed)
I called the patient and left the pharmacy phone # on his voicemail for him to call them back. Also we spoke with Danae Chen his home nurse who will call the pharmacy with the patient on Friday to make sure he gets the antibiotic by then if he hasn't already -Dr. Cannon Kettle

## 2017-08-19 NOTE — Telephone Encounter (Signed)
Hi this is Shanon Brow from [facility]. We received a prescription for baxdela that Dr. Cannon Kettle submitted. Our patient care coordinator has reached out to Mr. Fidalgo a couple of times to go over HIPAA information and to schedule shipment and delivery of the medication but the pt keeps hanging up on our patient care coordinator. I didn't know how the office would like [place] to move forward with this prescription or if they would like to speak to the pt to let the pt know that we will be the dispensing pharmacy for his baxdela medication. If you can give [place] a call back at [phone]. Thank you.

## 2017-08-21 DIAGNOSIS — I130 Hypertensive heart and chronic kidney disease with heart failure and stage 1 through stage 4 chronic kidney disease, or unspecified chronic kidney disease: Secondary | ICD-10-CM | POA: Diagnosis not present

## 2017-08-21 DIAGNOSIS — L97519 Non-pressure chronic ulcer of other part of right foot with unspecified severity: Secondary | ICD-10-CM | POA: Diagnosis not present

## 2017-08-21 DIAGNOSIS — N183 Chronic kidney disease, stage 3 (moderate): Secondary | ICD-10-CM | POA: Diagnosis not present

## 2017-08-21 DIAGNOSIS — Z79891 Long term (current) use of opiate analgesic: Secondary | ICD-10-CM | POA: Diagnosis not present

## 2017-08-21 DIAGNOSIS — Z8614 Personal history of Methicillin resistant Staphylococcus aureus infection: Secondary | ICD-10-CM | POA: Diagnosis not present

## 2017-08-21 DIAGNOSIS — E11621 Type 2 diabetes mellitus with foot ulcer: Secondary | ICD-10-CM | POA: Diagnosis not present

## 2017-08-21 DIAGNOSIS — I2510 Atherosclerotic heart disease of native coronary artery without angina pectoris: Secondary | ICD-10-CM | POA: Diagnosis not present

## 2017-08-21 DIAGNOSIS — I509 Heart failure, unspecified: Secondary | ICD-10-CM | POA: Diagnosis not present

## 2017-08-21 DIAGNOSIS — I482 Chronic atrial fibrillation: Secondary | ICD-10-CM | POA: Diagnosis not present

## 2017-08-21 DIAGNOSIS — E1122 Type 2 diabetes mellitus with diabetic chronic kidney disease: Secondary | ICD-10-CM | POA: Diagnosis not present

## 2017-08-24 DIAGNOSIS — I130 Hypertensive heart and chronic kidney disease with heart failure and stage 1 through stage 4 chronic kidney disease, or unspecified chronic kidney disease: Secondary | ICD-10-CM | POA: Diagnosis not present

## 2017-08-24 DIAGNOSIS — E1122 Type 2 diabetes mellitus with diabetic chronic kidney disease: Secondary | ICD-10-CM | POA: Diagnosis not present

## 2017-08-24 DIAGNOSIS — L97519 Non-pressure chronic ulcer of other part of right foot with unspecified severity: Secondary | ICD-10-CM | POA: Diagnosis not present

## 2017-08-24 DIAGNOSIS — I482 Chronic atrial fibrillation: Secondary | ICD-10-CM | POA: Diagnosis not present

## 2017-08-24 DIAGNOSIS — Z8614 Personal history of Methicillin resistant Staphylococcus aureus infection: Secondary | ICD-10-CM | POA: Diagnosis not present

## 2017-08-24 DIAGNOSIS — I509 Heart failure, unspecified: Secondary | ICD-10-CM | POA: Diagnosis not present

## 2017-08-24 DIAGNOSIS — Z79891 Long term (current) use of opiate analgesic: Secondary | ICD-10-CM | POA: Diagnosis not present

## 2017-08-24 DIAGNOSIS — N183 Chronic kidney disease, stage 3 (moderate): Secondary | ICD-10-CM | POA: Diagnosis not present

## 2017-08-24 DIAGNOSIS — E11621 Type 2 diabetes mellitus with foot ulcer: Secondary | ICD-10-CM | POA: Diagnosis not present

## 2017-08-24 DIAGNOSIS — I2510 Atherosclerotic heart disease of native coronary artery without angina pectoris: Secondary | ICD-10-CM | POA: Diagnosis not present

## 2017-08-26 DIAGNOSIS — I2510 Atherosclerotic heart disease of native coronary artery without angina pectoris: Secondary | ICD-10-CM | POA: Diagnosis not present

## 2017-08-26 DIAGNOSIS — Z8614 Personal history of Methicillin resistant Staphylococcus aureus infection: Secondary | ICD-10-CM | POA: Diagnosis not present

## 2017-08-26 DIAGNOSIS — Z79891 Long term (current) use of opiate analgesic: Secondary | ICD-10-CM | POA: Diagnosis not present

## 2017-08-26 DIAGNOSIS — I509 Heart failure, unspecified: Secondary | ICD-10-CM | POA: Diagnosis not present

## 2017-08-26 DIAGNOSIS — I482 Chronic atrial fibrillation: Secondary | ICD-10-CM | POA: Diagnosis not present

## 2017-08-26 DIAGNOSIS — E1122 Type 2 diabetes mellitus with diabetic chronic kidney disease: Secondary | ICD-10-CM | POA: Diagnosis not present

## 2017-08-26 DIAGNOSIS — N183 Chronic kidney disease, stage 3 (moderate): Secondary | ICD-10-CM | POA: Diagnosis not present

## 2017-08-26 DIAGNOSIS — E11621 Type 2 diabetes mellitus with foot ulcer: Secondary | ICD-10-CM | POA: Diagnosis not present

## 2017-08-26 DIAGNOSIS — L97519 Non-pressure chronic ulcer of other part of right foot with unspecified severity: Secondary | ICD-10-CM | POA: Diagnosis not present

## 2017-08-26 DIAGNOSIS — I130 Hypertensive heart and chronic kidney disease with heart failure and stage 1 through stage 4 chronic kidney disease, or unspecified chronic kidney disease: Secondary | ICD-10-CM | POA: Diagnosis not present

## 2017-08-27 DIAGNOSIS — Z6828 Body mass index (BMI) 28.0-28.9, adult: Secondary | ICD-10-CM | POA: Diagnosis not present

## 2017-08-27 DIAGNOSIS — E1165 Type 2 diabetes mellitus with hyperglycemia: Secondary | ICD-10-CM | POA: Diagnosis not present

## 2017-08-27 DIAGNOSIS — D649 Anemia, unspecified: Secondary | ICD-10-CM | POA: Diagnosis not present

## 2017-08-27 DIAGNOSIS — I10 Essential (primary) hypertension: Secondary | ICD-10-CM | POA: Diagnosis not present

## 2017-08-27 DIAGNOSIS — E785 Hyperlipidemia, unspecified: Secondary | ICD-10-CM | POA: Diagnosis not present

## 2017-08-28 ENCOUNTER — Ambulatory Visit (INDEPENDENT_AMBULATORY_CARE_PROVIDER_SITE_OTHER): Payer: Medicare HMO | Admitting: Sports Medicine

## 2017-08-28 ENCOUNTER — Telehealth: Payer: Self-pay | Admitting: *Deleted

## 2017-08-28 ENCOUNTER — Encounter: Payer: Self-pay | Admitting: Sports Medicine

## 2017-08-28 DIAGNOSIS — L97512 Non-pressure chronic ulcer of other part of right foot with fat layer exposed: Secondary | ICD-10-CM | POA: Diagnosis not present

## 2017-08-28 DIAGNOSIS — Z899 Acquired absence of limb, unspecified: Secondary | ICD-10-CM

## 2017-08-28 DIAGNOSIS — I739 Peripheral vascular disease, unspecified: Secondary | ICD-10-CM

## 2017-08-28 DIAGNOSIS — E1142 Type 2 diabetes mellitus with diabetic polyneuropathy: Secondary | ICD-10-CM

## 2017-08-28 DIAGNOSIS — M86171 Other acute osteomyelitis, right ankle and foot: Secondary | ICD-10-CM

## 2017-08-28 DIAGNOSIS — L02611 Cutaneous abscess of right foot: Secondary | ICD-10-CM

## 2017-08-28 DIAGNOSIS — L03031 Cellulitis of right toe: Secondary | ICD-10-CM

## 2017-08-28 NOTE — Progress Notes (Signed)
Subjective: Robson Trickey is a 67 y.o. male patient seen in office for postop visit #5 status post right foot wound debridement and placement of stravix graft performed 06/18/2017.  Patient reports that he thinks that his wound is getting better especially after he has started the antibiotic Baxdela, reports that he has a few more tablets left. Has home nurse assisting with dressing changes every other day using adaptic. Denies nausea/fever/vomiting/chills/night sweats/shortness of breath/pain. Admits a few episodes of upset stomach.  Patient reports that he is trying to stay off his foot has been using tennis shoe because of the rain.  Patient has no other pedal complaints at this time.  Fasting blood sugar not recorded  Patient Active Problem List   Diagnosis Date Noted  . Arthritis 09/17/2016  . Atrial flutter ([place]) 09/17/2016  . Coronary artery disease 09/17/2016  . Diabetes mellitus ([place]) 09/17/2016  . Disease of thyroid gland 09/17/2016  . HOH (hard of hearing) 09/17/2016  . Hyperlipidemia 09/17/2016  . ICD (implantable cardioverter-defibrillator) in place 09/17/2016  . Left foot pain 09/17/2016  . Skin cancer 09/17/2016  . TSH (thyroid-stimulating hormone deficiency) 08/13/2016  . Anasarca 07/30/2016  . Charcot foot due to diabetes mellitus ([place]) 02/13/2016  . Atypical atrial flutter ([place]) 10/04/2015  . Essential hypertension 10/04/2015  . Ischemic cardiomyopathy 10/04/2015  . Pure hypercholesterolemia 10/04/2015  . Amputated toe of left foot ([place]) 08/15/2015  . Diabetic peripheral neuropathy ([place]) 08/15/2015  . Type 2 diabetes mellitus with Charcot's joint of left foot ([place]) 08/15/2015  . Cardiac defibrillator in place 05/29/2015  . Cardiomyopathy ([place]) 05/29/2015  . Dual ICD (implantable cardioverter-defibrillator) in place 05/29/2015   Current Outpatient Medications on File Prior to Visit  Medication Sig Dispense Refill  . amiodarone (PACERONE) 200 MG tablet Take 200 mg  by mouth daily.  1  . amoxicillin-clavulanate (AUGMENTIN) 875-125 MG tablet Take 1 tablet by mouth 2 (two) times daily. 28 tablet 0  . apixaban (ELIQUIS) 5 MG TABS tablet Take 5 mg by mouth.    Marland Kitchen atorvastatin (LIPITOR) 40 MG tablet TAKE 1 TABLET ONCE DAILY.    Marland Kitchen atorvastatin (LIPITOR) 40 MG tablet Take 40 mg by mouth daily.  0  . becaplermin (REGRANEX) 0.01 % gel Apply to affected area 3 times week. 15 g 3  . carvedilol (COREG) 6.25 MG tablet Take 6.25 mg by mouth 2 (two) times daily.  1  . Delafloxacin Meglumine (BAXDELA) 450 MG TABS Take 1 tablet by mouth 2 (two) times daily. 20 tablet 0  . doxycycline (VIBRAMYCIN) 100 MG capsule Take 1 capsule (100 mg total) by mouth 2 (two) times daily. 28 capsule 0  . ELIQUIS 2.5 MG TABS tablet TAKE ONE TABLET BY MOUTH TWICE DAILY FOR afib  0  . furosemide (LASIX) 40 MG tablet Take 40 mg by mouth 2 (two) times daily.  2  . glimepiride (AMARYL) 2 MG tablet Take 2 mg by mouth.    Colbert Ewing 1 g injection See admin instructions.  0  . IRON PO Take 325 mg by mouth.    . levofloxacin (LEVAQUIN) 500 MG tablet Take 1 tablet (500 mg total) by mouth daily. 10 tablet 0  . levothyroxine (SYNTHROID, LEVOTHROID) 100 MCG tablet Take by mouth.    . levothyroxine (SYNTHROID, LEVOTHROID) 100 MCG tablet Take 100 mcg by mouth every morning.  6  . levothyroxine (SYNTHROID, LEVOTHROID) 88 MCG tablet Take by mouth.    . levothyroxine (SYNTHROID, LEVOTHROID) 88 MCG tablet Take 88 mcg by mouth  daily.  0  . Linezolid in Sodium Chloride 600-0.9 MG/300ML-% SOLN     . losartan (COZAAR) 25 MG tablet Take 25 mg by mouth.    . losartan (COZAAR) 50 MG tablet Take 50 mg by mouth.    . metFORMIN (GLUCOPHAGE) 1000 MG tablet Take 1,000 mg by mouth 2 (two) times daily with a meal.  0  . mupirocin ointment (BACTROBAN) 2 %     . simvastatin (ZOCOR) 20 MG tablet Take by mouth.    . spironolactone (ALDACTONE) 25 MG tablet Take 25 mg by mouth daily.  6  . torsemide (DEMADEX) 20 MG tablet TAKE 1  OR 2 TABLETS BY MOUTH EVERY DAY AS DIRECTED  6  . XYLOCAINE 1 % (with preservative) injection USE 3.58ml with invanz FOR injection  0   No current facility-administered medications on file prior to visit.    Not on File  Recent Results (from the past 2160 hour(s))  WOUND CULTURE     Status: Abnormal   Collection Time: 06/17/17  1:53 PM  Result Value Ref Range   Gram Stain Result CANCELED     Comment: The specimen submitted does not meet the laboratory's criteria for acceptability. Refer to [employer] [facility] for specimen acceptability criteria. RECEIVE E SWAB 06/20/2017-[place]  Result canceled by the ancillary.    Aerobic Bacterial Culture Final report (A)    Organism ID, Bacteria Escherichia coli (A)     Comment: Heavy growth   Organism ID, Bacteria Mixed skin flora     Comment: Heavy growth   Antimicrobial Susceptibility Comment     Comment:       ** S = Susceptible; I = Intermediate; R = Resistant **                    P = Positive; N = Negative             MICS are expressed in micrograms per mL    Antibiotic                 RSLT#1    RSLT#2    RSLT#3    RSLT#4 Amoxicillin/Clavulanic Acid    S Ampicillin                     S Cefepime                       S Ceftriaxone                    S Cefuroxime                     S Ciprofloxacin                  S Ertapenem                      S Gentamicin                     S Imipenem                       S Levofloxacin                   S Meropenem                      S Piperacillin/Tazobactam  S Tetracycline                   S Tobramycin                     S Trimethoprim/Sulfa             S     Objective: There were no vitals filed for this visit.  General: Patient is awake, alert, oriented x 3 and in no acute distress.  Dermatology: Skin is warm and dry bilateral with a full thickness ulceration present at medial 1st MTPJ on right down to level of capsule and subcutaneous fat medially  extending plantarly where bone is easily probed at the plantar aspect of the metatarsal head, Ulceration measures 4cm x 3 cm x 0.4 cm. There is a minimally keratotic border with a 50:50 fibro-granular base and maceration with yellow drainage.  Sutures are intact to area where graft was sutured in place medially.  There is no malodor, focal periwound erythema &  edema. No other acute signs of infection.   Vascular: Dorsalis Pedis pulse = 1/4 Bilateral,  Posterior Tibial pulse = 0/4 Bilateral,  Capillary Fill Time < 5 seconds  Neurologic: Protective sensation absent on right.  Musculosketal: There is no pain with palpation to ulcerated area. No pain with compression to calves bilateral. S/p Right 5th toe amputation secondary to gangrene and left fourth toe amputation from past.  No results for input(s): GRAMSTAIN, LABORGA in the last 8760 hours.  Assessment and Plan:  Problem List Items Addressed This Visit    None    Visit Diagnoses    Right foot ulcer, with fat layer exposed ([place])    -  Primary   Acute osteomyelitis of toe of right foot ([place])       + probe to bone   Cellulitis and abscess of toe of right foot       PVD (peripheral vascular disease) ([place])       Diabetic polyneuropathy associated with type 2 diabetes mellitus ([place])       History of amputation         -Examined patient and discussed the progression of the wound and treatment alternatives. -Advised patient that if wound continues to remain stagnant or continues to probe to bone may benefit from a repeat debridement versus amputation -Cleanse ulceration with wound wash and excisionally debrided wound to healthy bleeding margins and debrided area of exposed capsule plantarly using a sterile tissue nipper and covered wound bed with Prisma Ag, nursing to do the same 3 times per week -Continue with Baxdela antibiotic to take as instructed with yogurt -Continue with wide tennis shoe and postop shoe and limit weightbearing to  necessity -Advised patient to call office or come in sooner if ulcerations worsen or fail to continue to improve -Patient to return in 10-14 days for follow-up wound care/ will x-ray and discuss possible surgery.   [name], DPM

## 2017-08-28 NOTE — Telephone Encounter (Signed)
-----   Message from [name], [place] sent at 08/28/2017  1:22 PM EST ----- Regarding: Nursing wound care orders PRISMA Ag to right foot ulcer covered with dry dressing 3x per week

## 2017-08-28 NOTE — Telephone Encounter (Signed)
Orders of 08/28/2017 1:22pm faxed to [facility].

## 2017-08-31 DIAGNOSIS — I509 Heart failure, unspecified: Secondary | ICD-10-CM | POA: Diagnosis not present

## 2017-08-31 DIAGNOSIS — I482 Chronic atrial fibrillation: Secondary | ICD-10-CM | POA: Diagnosis not present

## 2017-08-31 DIAGNOSIS — Z8614 Personal history of Methicillin resistant Staphylococcus aureus infection: Secondary | ICD-10-CM | POA: Diagnosis not present

## 2017-08-31 DIAGNOSIS — E1122 Type 2 diabetes mellitus with diabetic chronic kidney disease: Secondary | ICD-10-CM | POA: Diagnosis not present

## 2017-08-31 DIAGNOSIS — I130 Hypertensive heart and chronic kidney disease with heart failure and stage 1 through stage 4 chronic kidney disease, or unspecified chronic kidney disease: Secondary | ICD-10-CM | POA: Diagnosis not present

## 2017-08-31 DIAGNOSIS — L97519 Non-pressure chronic ulcer of other part of right foot with unspecified severity: Secondary | ICD-10-CM | POA: Diagnosis not present

## 2017-08-31 DIAGNOSIS — E11621 Type 2 diabetes mellitus with foot ulcer: Secondary | ICD-10-CM | POA: Diagnosis not present

## 2017-08-31 DIAGNOSIS — Z79891 Long term (current) use of opiate analgesic: Secondary | ICD-10-CM | POA: Diagnosis not present

## 2017-08-31 DIAGNOSIS — N183 Chronic kidney disease, stage 3 (moderate): Secondary | ICD-10-CM | POA: Diagnosis not present

## 2017-08-31 DIAGNOSIS — I2510 Atherosclerotic heart disease of native coronary artery without angina pectoris: Secondary | ICD-10-CM | POA: Diagnosis not present

## 2017-09-02 DIAGNOSIS — I482 Chronic atrial fibrillation: Secondary | ICD-10-CM | POA: Diagnosis not present

## 2017-09-02 DIAGNOSIS — L570 Actinic keratosis: Secondary | ICD-10-CM | POA: Diagnosis not present

## 2017-09-02 DIAGNOSIS — E11621 Type 2 diabetes mellitus with foot ulcer: Secondary | ICD-10-CM | POA: Diagnosis not present

## 2017-09-02 DIAGNOSIS — I130 Hypertensive heart and chronic kidney disease with heart failure and stage 1 through stage 4 chronic kidney disease, or unspecified chronic kidney disease: Secondary | ICD-10-CM | POA: Diagnosis not present

## 2017-09-02 DIAGNOSIS — E113293 Type 2 diabetes mellitus with mild nonproliferative diabetic retinopathy without macular edema, bilateral: Secondary | ICD-10-CM | POA: Diagnosis not present

## 2017-09-02 DIAGNOSIS — L97519 Non-pressure chronic ulcer of other part of right foot with unspecified severity: Secondary | ICD-10-CM | POA: Diagnosis not present

## 2017-09-02 DIAGNOSIS — I509 Heart failure, unspecified: Secondary | ICD-10-CM | POA: Diagnosis not present

## 2017-09-02 DIAGNOSIS — I2510 Atherosclerotic heart disease of native coronary artery without angina pectoris: Secondary | ICD-10-CM | POA: Diagnosis not present

## 2017-09-02 DIAGNOSIS — Z8614 Personal history of Methicillin resistant Staphylococcus aureus infection: Secondary | ICD-10-CM | POA: Diagnosis not present

## 2017-09-02 DIAGNOSIS — Z79891 Long term (current) use of opiate analgesic: Secondary | ICD-10-CM | POA: Diagnosis not present

## 2017-09-02 DIAGNOSIS — N183 Chronic kidney disease, stage 3 (moderate): Secondary | ICD-10-CM | POA: Diagnosis not present

## 2017-09-02 DIAGNOSIS — C44311 Basal cell carcinoma of skin of nose: Secondary | ICD-10-CM | POA: Diagnosis not present

## 2017-09-02 DIAGNOSIS — E1122 Type 2 diabetes mellitus with diabetic chronic kidney disease: Secondary | ICD-10-CM | POA: Diagnosis not present

## 2017-09-04 DIAGNOSIS — Z8614 Personal history of Methicillin resistant Staphylococcus aureus infection: Secondary | ICD-10-CM | POA: Diagnosis not present

## 2017-09-04 DIAGNOSIS — E1122 Type 2 diabetes mellitus with diabetic chronic kidney disease: Secondary | ICD-10-CM | POA: Diagnosis not present

## 2017-09-04 DIAGNOSIS — N183 Chronic kidney disease, stage 3 (moderate): Secondary | ICD-10-CM | POA: Diagnosis not present

## 2017-09-04 DIAGNOSIS — I509 Heart failure, unspecified: Secondary | ICD-10-CM | POA: Diagnosis not present

## 2017-09-04 DIAGNOSIS — E11621 Type 2 diabetes mellitus with foot ulcer: Secondary | ICD-10-CM | POA: Diagnosis not present

## 2017-09-04 DIAGNOSIS — L97519 Non-pressure chronic ulcer of other part of right foot with unspecified severity: Secondary | ICD-10-CM | POA: Diagnosis not present

## 2017-09-04 DIAGNOSIS — I130 Hypertensive heart and chronic kidney disease with heart failure and stage 1 through stage 4 chronic kidney disease, or unspecified chronic kidney disease: Secondary | ICD-10-CM | POA: Diagnosis not present

## 2017-09-04 DIAGNOSIS — I2510 Atherosclerotic heart disease of native coronary artery without angina pectoris: Secondary | ICD-10-CM | POA: Diagnosis not present

## 2017-09-04 DIAGNOSIS — I482 Chronic atrial fibrillation: Secondary | ICD-10-CM | POA: Diagnosis not present

## 2017-09-04 DIAGNOSIS — Z79891 Long term (current) use of opiate analgesic: Secondary | ICD-10-CM | POA: Diagnosis not present

## 2017-09-07 DIAGNOSIS — N183 Chronic kidney disease, stage 3 (moderate): Secondary | ICD-10-CM | POA: Diagnosis not present

## 2017-09-07 DIAGNOSIS — I130 Hypertensive heart and chronic kidney disease with heart failure and stage 1 through stage 4 chronic kidney disease, or unspecified chronic kidney disease: Secondary | ICD-10-CM | POA: Diagnosis not present

## 2017-09-07 DIAGNOSIS — Z8614 Personal history of Methicillin resistant Staphylococcus aureus infection: Secondary | ICD-10-CM | POA: Diagnosis not present

## 2017-09-07 DIAGNOSIS — E11621 Type 2 diabetes mellitus with foot ulcer: Secondary | ICD-10-CM | POA: Diagnosis not present

## 2017-09-07 DIAGNOSIS — I482 Chronic atrial fibrillation: Secondary | ICD-10-CM | POA: Diagnosis not present

## 2017-09-07 DIAGNOSIS — E1122 Type 2 diabetes mellitus with diabetic chronic kidney disease: Secondary | ICD-10-CM | POA: Diagnosis not present

## 2017-09-07 DIAGNOSIS — L97519 Non-pressure chronic ulcer of other part of right foot with unspecified severity: Secondary | ICD-10-CM | POA: Diagnosis not present

## 2017-09-07 DIAGNOSIS — Z79891 Long term (current) use of opiate analgesic: Secondary | ICD-10-CM | POA: Diagnosis not present

## 2017-09-07 DIAGNOSIS — I509 Heart failure, unspecified: Secondary | ICD-10-CM | POA: Diagnosis not present

## 2017-09-07 DIAGNOSIS — I2510 Atherosclerotic heart disease of native coronary artery without angina pectoris: Secondary | ICD-10-CM | POA: Diagnosis not present

## 2017-09-09 DIAGNOSIS — I130 Hypertensive heart and chronic kidney disease with heart failure and stage 1 through stage 4 chronic kidney disease, or unspecified chronic kidney disease: Secondary | ICD-10-CM | POA: Diagnosis not present

## 2017-09-09 DIAGNOSIS — I509 Heart failure, unspecified: Secondary | ICD-10-CM | POA: Diagnosis not present

## 2017-09-09 DIAGNOSIS — Z8614 Personal history of Methicillin resistant Staphylococcus aureus infection: Secondary | ICD-10-CM | POA: Diagnosis not present

## 2017-09-09 DIAGNOSIS — I2510 Atherosclerotic heart disease of native coronary artery without angina pectoris: Secondary | ICD-10-CM | POA: Diagnosis not present

## 2017-09-09 DIAGNOSIS — E1122 Type 2 diabetes mellitus with diabetic chronic kidney disease: Secondary | ICD-10-CM | POA: Diagnosis not present

## 2017-09-09 DIAGNOSIS — I482 Chronic atrial fibrillation: Secondary | ICD-10-CM | POA: Diagnosis not present

## 2017-09-09 DIAGNOSIS — E11621 Type 2 diabetes mellitus with foot ulcer: Secondary | ICD-10-CM | POA: Diagnosis not present

## 2017-09-09 DIAGNOSIS — L97519 Non-pressure chronic ulcer of other part of right foot with unspecified severity: Secondary | ICD-10-CM | POA: Diagnosis not present

## 2017-09-09 DIAGNOSIS — N183 Chronic kidney disease, stage 3 (moderate): Secondary | ICD-10-CM | POA: Diagnosis not present

## 2017-09-09 DIAGNOSIS — Z79891 Long term (current) use of opiate analgesic: Secondary | ICD-10-CM | POA: Diagnosis not present

## 2017-09-10 DIAGNOSIS — R5381 Other malaise: Secondary | ICD-10-CM | POA: Diagnosis not present

## 2017-09-10 DIAGNOSIS — M2550 Pain in unspecified joint: Secondary | ICD-10-CM | POA: Diagnosis not present

## 2017-09-10 DIAGNOSIS — M109 Gout, unspecified: Secondary | ICD-10-CM | POA: Diagnosis not present

## 2017-09-10 DIAGNOSIS — Z1331 Encounter for screening for depression: Secondary | ICD-10-CM | POA: Diagnosis not present

## 2017-09-10 DIAGNOSIS — D649 Anemia, unspecified: Secondary | ICD-10-CM | POA: Diagnosis not present

## 2017-09-10 DIAGNOSIS — E1165 Type 2 diabetes mellitus with hyperglycemia: Secondary | ICD-10-CM | POA: Diagnosis not present

## 2017-09-10 DIAGNOSIS — E785 Hyperlipidemia, unspecified: Secondary | ICD-10-CM | POA: Diagnosis not present

## 2017-09-10 DIAGNOSIS — I10 Essential (primary) hypertension: Secondary | ICD-10-CM | POA: Diagnosis not present

## 2017-09-11 ENCOUNTER — Ambulatory Visit (INDEPENDENT_AMBULATORY_CARE_PROVIDER_SITE_OTHER): Payer: Medicare HMO

## 2017-09-11 ENCOUNTER — Ambulatory Visit (INDEPENDENT_AMBULATORY_CARE_PROVIDER_SITE_OTHER): Payer: Medicare HMO | Admitting: Sports Medicine

## 2017-09-11 ENCOUNTER — Encounter: Payer: Self-pay | Admitting: Sports Medicine

## 2017-09-11 ENCOUNTER — Telehealth: Payer: Self-pay | Admitting: *Deleted

## 2017-09-11 DIAGNOSIS — I739 Peripheral vascular disease, unspecified: Secondary | ICD-10-CM

## 2017-09-11 DIAGNOSIS — M79671 Pain in right foot: Secondary | ICD-10-CM | POA: Diagnosis not present

## 2017-09-11 DIAGNOSIS — L02611 Cutaneous abscess of right foot: Secondary | ICD-10-CM

## 2017-09-11 DIAGNOSIS — L97512 Non-pressure chronic ulcer of other part of right foot with fat layer exposed: Secondary | ICD-10-CM

## 2017-09-11 DIAGNOSIS — E1142 Type 2 diabetes mellitus with diabetic polyneuropathy: Secondary | ICD-10-CM

## 2017-09-11 DIAGNOSIS — M86171 Other acute osteomyelitis, right ankle and foot: Secondary | ICD-10-CM

## 2017-09-11 DIAGNOSIS — L03031 Cellulitis of right toe: Secondary | ICD-10-CM

## 2017-09-11 MED ORDER — CIPROFLOXACIN HCL 500 MG PO TABS
500.0000 mg | ORAL_TABLET | Freq: Two times a day (BID) | ORAL | 0 refills | Status: DC
Start: 2017-09-11 — End: 2018-01-04

## 2017-09-11 NOTE — Progress Notes (Signed)
Subjective: Johnanthony Wilden is a 67 y.o. male patient seen in office for postop visit #6 status post right foot wound debridement and placement of stravix graft performed 06/18/2017.  Patient reports that there is a lot of drainage especially after he finished his last antibiotic. Has home nurse assisting with dressing changes every other day using Prisma. Denies nausea/fever/vomiting/chills/night sweats/shortness of breath/pain. Patient is back in post op shoe with no issues ambulating.  Patient has no other pedal complaints at this time.  Fasting blood sugar not recorded  Patient Active Problem List   Diagnosis Date Noted  . Arthritis 09/17/2016  . Atrial flutter ([place]) 09/17/2016  . Coronary artery disease 09/17/2016  . Diabetes mellitus ([place]) 09/17/2016  . Disease of thyroid gland 09/17/2016  . HOH (hard of hearing) 09/17/2016  . Hyperlipidemia 09/17/2016  . ICD (implantable cardioverter-defibrillator) in place 09/17/2016  . Left foot pain 09/17/2016  . Skin cancer 09/17/2016  . TSH (thyroid-stimulating hormone deficiency) 08/13/2016  . Anasarca 07/30/2016  . Charcot foot due to diabetes mellitus ([place]) 02/13/2016  . Atypical atrial flutter ([place]) 10/04/2015  . Essential hypertension 10/04/2015  . Ischemic cardiomyopathy 10/04/2015  . Pure hypercholesterolemia 10/04/2015  . Amputated toe of left foot ([place]) 08/15/2015  . Diabetic peripheral neuropathy ([place]) 08/15/2015  . Type 2 diabetes mellitus with Charcot's joint of left foot ([place]) 08/15/2015  . Cardiac defibrillator in place 05/29/2015  . Cardiomyopathy ([place]) 05/29/2015  . Dual ICD (implantable cardioverter-defibrillator) in place 05/29/2015   Current Outpatient Medications on File Prior to Visit  Medication Sig Dispense Refill  . amiodarone (PACERONE) 200 MG tablet Take 200 mg by mouth daily.  1  . amoxicillin-clavulanate (AUGMENTIN) 875-125 MG tablet Take 1 tablet by mouth 2 (two) times daily. 28 tablet 0  . apixaban  (ELIQUIS) 5 MG TABS tablet Take 5 mg by mouth.    Marland Kitchen atorvastatin (LIPITOR) 40 MG tablet TAKE 1 TABLET ONCE DAILY.    Marland Kitchen atorvastatin (LIPITOR) 40 MG tablet Take 40 mg by mouth daily.  0  . becaplermin (REGRANEX) 0.01 % gel Apply to affected area 3 times week. 15 g 3  . carvedilol (COREG) 6.25 MG tablet Take 6.25 mg by mouth 2 (two) times daily.  1  . Delafloxacin Meglumine (BAXDELA) 450 MG TABS Take 1 tablet by mouth 2 (two) times daily. 20 tablet 0  . doxycycline (VIBRAMYCIN) 100 MG capsule Take 1 capsule (100 mg total) by mouth 2 (two) times daily. 28 capsule 0  . ELIQUIS 2.5 MG TABS tablet TAKE ONE TABLET BY MOUTH TWICE DAILY FOR afib  0  . furosemide (LASIX) 40 MG tablet Take 40 mg by mouth 2 (two) times daily.  2  . glimepiride (AMARYL) 2 MG tablet Take 2 mg by mouth.    Colbert Ewing 1 g injection See admin instructions.  0  . IRON PO Take 325 mg by mouth.    . levofloxacin (LEVAQUIN) 500 MG tablet Take 1 tablet (500 mg total) by mouth daily. 10 tablet 0  . levothyroxine (SYNTHROID, LEVOTHROID) 100 MCG tablet Take by mouth.    . levothyroxine (SYNTHROID, LEVOTHROID) 100 MCG tablet Take 100 mcg by mouth every morning.  6  . levothyroxine (SYNTHROID, LEVOTHROID) 88 MCG tablet Take by mouth.    . levothyroxine (SYNTHROID, LEVOTHROID) 88 MCG tablet Take 88 mcg by mouth daily.  0  . Linezolid in Sodium Chloride 600-0.9 MG/300ML-% SOLN     . losartan (COZAAR) 25 MG tablet Take 25 mg by mouth.    Marland Kitchen  losartan (COZAAR) 50 MG tablet Take 50 mg by mouth.    . metFORMIN (GLUCOPHAGE) 1000 MG tablet Take 1,000 mg by mouth 2 (two) times daily with a meal.  0  . mupirocin ointment (BACTROBAN) 2 %     . simvastatin (ZOCOR) 20 MG tablet Take by mouth.    . spironolactone (ALDACTONE) 25 MG tablet Take 25 mg by mouth daily.  6  . torsemide (DEMADEX) 20 MG tablet TAKE 1 OR 2 TABLETS BY MOUTH EVERY DAY AS DIRECTED  6  . XYLOCAINE 1 % (with preservative) injection USE 3.77ml with invanz FOR injection  0   No  current facility-administered medications on file prior to visit.    Not on File  Recent Results (from the past 2160 hour(s))  WOUND CULTURE     Status: Abnormal   Collection Time: 06/17/17  1:53 PM  Result Value Ref Range   Gram Stain Result CANCELED     Comment: The specimen submitted does not meet the laboratory's criteria for acceptability. Refer to [employer] of Services for specimen acceptability criteria. RECEIVE E SWAB 06/20/2017-[place]  Result canceled by the ancillary.    Aerobic Bacterial Culture Final report (A)    Organism ID, Bacteria Escherichia coli (A)     Comment: Heavy growth   Organism ID, Bacteria Mixed skin flora     Comment: Heavy growth   Antimicrobial Susceptibility Comment     Comment:       ** S = Susceptible; I = Intermediate; R = Resistant **                    P = Positive; N = Negative             MICS are expressed in micrograms per mL    Antibiotic                 RSLT#1    RSLT#2    RSLT#3    RSLT#4 Amoxicillin/Clavulanic Acid    S Ampicillin                     S Cefepime                       S Ceftriaxone                    S Cefuroxime                     S Ciprofloxacin                  S Ertapenem                      S Gentamicin                     S Imipenem                       S Levofloxacin                   S Meropenem                      S Piperacillin/Tazobactam        S Tetracycline                   S Tobramycin  S Trimethoprim/Sulfa             S     Objective: There were no vitals filed for this visit.  General: Patient is awake, alert, oriented x 3 and in no acute distress.  Dermatology: Skin is warm and dry bilateral with a full thickness ulceration present at medial 1st MTPJ on right down to level of capsule and subcutaneous fat medially extending plantarly where bone is easily probed and visible sesamoid at the plantar aspect of the metatarsal head that is exposed, Ulceration  measures 3cm x 3.5 cm x 1 cm. There is a mildly macerated border with a 50:50 fibro-granular base with yellow and now green drainage.  There is malodor, focal periwound erythema &  edema. No other acute signs of infection.  Sutures intact where previous wound graft was adhered.   Vascular: Dorsalis Pedis pulse = 1/4 Bilateral,  Posterior Tibial pulse = 0/4 Bilateral,  Capillary Fill Time < 5 seconds  Neurologic: Protective sensation absent on right.  Musculosketal: There is no pain with palpation to ulcerated area.  Toes sits in slight dorsal flexion likely secondary to chronic plantar wound with possible rupture plantar flexor tendon, no pain with compression to calves bilateral. S/p Right 5th toe amputation secondary to gangrene and left fourth toe amputation from past.  No results for input(s): GRAMSTAIN, LABORGA in the last 8760 hours.   Xray right foot at the area of concern there is no obvious bony destruction however there is surrounding soft tissue swelling no emphysema or gas gangrene the right hallux sits in slight dorsal dislocated position likely from chronic ulcer with possible flexor tendon rupture.  There is significant [place] sclerosis of his vessels and diffuse arthritis in other parts of the foot.  Status post right partial fifth ray amputation.  Assessment and Plan:  Problem List Items Addressed This Visit    None    Visit Diagnoses    Acute osteomyelitis of toe of right foot ([place])    -  Primary   Relevant Orders   DG Foot Complete Right   Right foot ulcer, with fat layer exposed ([place])       Relevant Orders   DG Foot Complete Right   Right foot pain       Relevant Orders   DG Foot Complete Right   Cellulitis and abscess of toe of right foot       PVD (peripheral vascular disease) ([place])       Diabetic polyneuropathy associated with type 2 diabetes mellitus ([place])         -Examined patient and discussed the progression of the wound and treatment  alternatives. -Xrays reviewed -Cleansed ulceration with gentamicin wound wash and excisionally debrided wound to healthy bleeding margins and debrided area of exposed capsule plantarly and remove exposed sesamoid bone using a sterile tissue nipper and covered wound bed medially with Prisma Ag and applied iodoform packing plantarly, nursing to do the same 3 times per week -Baxdela completed at this point added on ciprofloxacin to take for the next 2 weeks advised patient if the wound is not better in 2 weeks we will plan for amputation -Continue with post op shoe -Advised patient to call office or come in sooner if ulcerations worsen or fail to continue to improve -Patient to return in 2 weeks for follow-up wound care  [name], DPM

## 2017-09-11 NOTE — Telephone Encounter (Signed)
-----   Message from [name], [place] sent at 09/11/2017  1:46 PM EST ----- Regarding: Home nursing wound care orders To the plantar aspect of the right great toe ulceration apply quarter inch iodoform packing and to the medial side of the ulcer applied a small piece of Prisma Ag.  Dressing to be changed 3 times per week please make sure an ABD pad is used to help absorb excessive drainage Thanks Dr. Cannon Kettle

## 2017-09-11 NOTE — Telephone Encounter (Signed)
Faxed orders to [facility]. 

## 2017-09-14 DIAGNOSIS — Z8614 Personal history of Methicillin resistant Staphylococcus aureus infection: Secondary | ICD-10-CM | POA: Diagnosis not present

## 2017-09-14 DIAGNOSIS — I509 Heart failure, unspecified: Secondary | ICD-10-CM | POA: Diagnosis not present

## 2017-09-14 DIAGNOSIS — L97519 Non-pressure chronic ulcer of other part of right foot with unspecified severity: Secondary | ICD-10-CM | POA: Diagnosis not present

## 2017-09-14 DIAGNOSIS — Z79891 Long term (current) use of opiate analgesic: Secondary | ICD-10-CM | POA: Diagnosis not present

## 2017-09-14 DIAGNOSIS — I2510 Atherosclerotic heart disease of native coronary artery without angina pectoris: Secondary | ICD-10-CM | POA: Diagnosis not present

## 2017-09-14 DIAGNOSIS — N183 Chronic kidney disease, stage 3 (moderate): Secondary | ICD-10-CM | POA: Diagnosis not present

## 2017-09-14 DIAGNOSIS — E1122 Type 2 diabetes mellitus with diabetic chronic kidney disease: Secondary | ICD-10-CM | POA: Diagnosis not present

## 2017-09-14 DIAGNOSIS — E11621 Type 2 diabetes mellitus with foot ulcer: Secondary | ICD-10-CM | POA: Diagnosis not present

## 2017-09-14 DIAGNOSIS — I482 Chronic atrial fibrillation: Secondary | ICD-10-CM | POA: Diagnosis not present

## 2017-09-14 DIAGNOSIS — I130 Hypertensive heart and chronic kidney disease with heart failure and stage 1 through stage 4 chronic kidney disease, or unspecified chronic kidney disease: Secondary | ICD-10-CM | POA: Diagnosis not present

## 2017-09-16 DIAGNOSIS — N183 Chronic kidney disease, stage 3 (moderate): Secondary | ICD-10-CM | POA: Diagnosis not present

## 2017-09-16 DIAGNOSIS — I482 Chronic atrial fibrillation: Secondary | ICD-10-CM | POA: Diagnosis not present

## 2017-09-16 DIAGNOSIS — I130 Hypertensive heart and chronic kidney disease with heart failure and stage 1 through stage 4 chronic kidney disease, or unspecified chronic kidney disease: Secondary | ICD-10-CM | POA: Diagnosis not present

## 2017-09-16 DIAGNOSIS — E11621 Type 2 diabetes mellitus with foot ulcer: Secondary | ICD-10-CM | POA: Diagnosis not present

## 2017-09-16 DIAGNOSIS — Z79891 Long term (current) use of opiate analgesic: Secondary | ICD-10-CM | POA: Diagnosis not present

## 2017-09-16 DIAGNOSIS — I2510 Atherosclerotic heart disease of native coronary artery without angina pectoris: Secondary | ICD-10-CM | POA: Diagnosis not present

## 2017-09-16 DIAGNOSIS — Z8614 Personal history of Methicillin resistant Staphylococcus aureus infection: Secondary | ICD-10-CM | POA: Diagnosis not present

## 2017-09-16 DIAGNOSIS — E1122 Type 2 diabetes mellitus with diabetic chronic kidney disease: Secondary | ICD-10-CM | POA: Diagnosis not present

## 2017-09-16 DIAGNOSIS — I509 Heart failure, unspecified: Secondary | ICD-10-CM | POA: Diagnosis not present

## 2017-09-16 DIAGNOSIS — L97519 Non-pressure chronic ulcer of other part of right foot with unspecified severity: Secondary | ICD-10-CM | POA: Diagnosis not present

## 2017-09-17 DIAGNOSIS — N189 Chronic kidney disease, unspecified: Secondary | ICD-10-CM | POA: Diagnosis not present

## 2017-09-17 DIAGNOSIS — Z6827 Body mass index (BMI) 27.0-27.9, adult: Secondary | ICD-10-CM | POA: Diagnosis not present

## 2017-09-17 DIAGNOSIS — I10 Essential (primary) hypertension: Secondary | ICD-10-CM | POA: Diagnosis not present

## 2017-09-17 DIAGNOSIS — E1165 Type 2 diabetes mellitus with hyperglycemia: Secondary | ICD-10-CM | POA: Diagnosis not present

## 2017-09-18 DIAGNOSIS — I2510 Atherosclerotic heart disease of native coronary artery without angina pectoris: Secondary | ICD-10-CM | POA: Diagnosis not present

## 2017-09-18 DIAGNOSIS — Z79891 Long term (current) use of opiate analgesic: Secondary | ICD-10-CM | POA: Diagnosis not present

## 2017-09-18 DIAGNOSIS — E1122 Type 2 diabetes mellitus with diabetic chronic kidney disease: Secondary | ICD-10-CM | POA: Diagnosis not present

## 2017-09-18 DIAGNOSIS — N183 Chronic kidney disease, stage 3 (moderate): Secondary | ICD-10-CM | POA: Diagnosis not present

## 2017-09-18 DIAGNOSIS — Z8614 Personal history of Methicillin resistant Staphylococcus aureus infection: Secondary | ICD-10-CM | POA: Diagnosis not present

## 2017-09-18 DIAGNOSIS — E11621 Type 2 diabetes mellitus with foot ulcer: Secondary | ICD-10-CM | POA: Diagnosis not present

## 2017-09-18 DIAGNOSIS — I482 Chronic atrial fibrillation: Secondary | ICD-10-CM | POA: Diagnosis not present

## 2017-09-18 DIAGNOSIS — I130 Hypertensive heart and chronic kidney disease with heart failure and stage 1 through stage 4 chronic kidney disease, or unspecified chronic kidney disease: Secondary | ICD-10-CM | POA: Diagnosis not present

## 2017-09-18 DIAGNOSIS — I509 Heart failure, unspecified: Secondary | ICD-10-CM | POA: Diagnosis not present

## 2017-09-18 DIAGNOSIS — L97519 Non-pressure chronic ulcer of other part of right foot with unspecified severity: Secondary | ICD-10-CM | POA: Diagnosis not present

## 2017-09-21 DIAGNOSIS — E1122 Type 2 diabetes mellitus with diabetic chronic kidney disease: Secondary | ICD-10-CM | POA: Diagnosis not present

## 2017-09-21 DIAGNOSIS — I2510 Atherosclerotic heart disease of native coronary artery without angina pectoris: Secondary | ICD-10-CM | POA: Diagnosis not present

## 2017-09-21 DIAGNOSIS — L97519 Non-pressure chronic ulcer of other part of right foot with unspecified severity: Secondary | ICD-10-CM | POA: Diagnosis not present

## 2017-09-21 DIAGNOSIS — I482 Chronic atrial fibrillation: Secondary | ICD-10-CM | POA: Diagnosis not present

## 2017-09-21 DIAGNOSIS — N183 Chronic kidney disease, stage 3 (moderate): Secondary | ICD-10-CM | POA: Diagnosis not present

## 2017-09-21 DIAGNOSIS — I509 Heart failure, unspecified: Secondary | ICD-10-CM | POA: Diagnosis not present

## 2017-09-21 DIAGNOSIS — E11621 Type 2 diabetes mellitus with foot ulcer: Secondary | ICD-10-CM | POA: Diagnosis not present

## 2017-09-21 DIAGNOSIS — Z79891 Long term (current) use of opiate analgesic: Secondary | ICD-10-CM | POA: Diagnosis not present

## 2017-09-21 DIAGNOSIS — Z8614 Personal history of Methicillin resistant Staphylococcus aureus infection: Secondary | ICD-10-CM | POA: Diagnosis not present

## 2017-09-21 DIAGNOSIS — I130 Hypertensive heart and chronic kidney disease with heart failure and stage 1 through stage 4 chronic kidney disease, or unspecified chronic kidney disease: Secondary | ICD-10-CM | POA: Diagnosis not present

## 2017-09-22 DIAGNOSIS — E46 Unspecified protein-calorie malnutrition: Secondary | ICD-10-CM | POA: Diagnosis not present

## 2017-09-22 DIAGNOSIS — I482 Chronic atrial fibrillation: Secondary | ICD-10-CM | POA: Diagnosis not present

## 2017-09-22 DIAGNOSIS — M869 Osteomyelitis, unspecified: Secondary | ICD-10-CM | POA: Diagnosis not present

## 2017-09-22 DIAGNOSIS — Z452 Encounter for adjustment and management of vascular access device: Secondary | ICD-10-CM | POA: Diagnosis not present

## 2017-09-22 DIAGNOSIS — Z89421 Acquired absence of other right toe(s): Secondary | ICD-10-CM | POA: Diagnosis not present

## 2017-09-22 DIAGNOSIS — M868X7 Other osteomyelitis, ankle and foot: Secondary | ICD-10-CM | POA: Diagnosis not present

## 2017-09-22 DIAGNOSIS — N189 Chronic kidney disease, unspecified: Secondary | ICD-10-CM | POA: Diagnosis not present

## 2017-09-22 DIAGNOSIS — N179 Acute kidney failure, unspecified: Secondary | ICD-10-CM | POA: Diagnosis not present

## 2017-09-22 DIAGNOSIS — M79674 Pain in right toe(s): Secondary | ICD-10-CM | POA: Diagnosis not present

## 2017-09-22 DIAGNOSIS — R2689 Other abnormalities of gait and mobility: Secondary | ICD-10-CM | POA: Diagnosis not present

## 2017-09-22 DIAGNOSIS — R531 Weakness: Secondary | ICD-10-CM | POA: Diagnosis not present

## 2017-09-22 DIAGNOSIS — M86171 Other acute osteomyelitis, right ankle and foot: Secondary | ICD-10-CM | POA: Diagnosis not present

## 2017-09-22 DIAGNOSIS — R4182 Altered mental status, unspecified: Secondary | ICD-10-CM | POA: Diagnosis not present

## 2017-09-22 DIAGNOSIS — I4891 Unspecified atrial fibrillation: Secondary | ICD-10-CM | POA: Diagnosis not present

## 2017-09-22 DIAGNOSIS — E039 Hypothyroidism, unspecified: Secondary | ICD-10-CM | POA: Diagnosis not present

## 2017-09-22 DIAGNOSIS — R402441 Other coma, without documented Glasgow coma scale score, or with partial score reported, in the field [EMT or ambulance]: Secondary | ICD-10-CM | POA: Diagnosis not present

## 2017-09-22 DIAGNOSIS — R2681 Unsteadiness on feet: Secondary | ICD-10-CM | POA: Diagnosis not present

## 2017-09-22 DIAGNOSIS — L97514 Non-pressure chronic ulcer of other part of right foot with necrosis of bone: Secondary | ICD-10-CM | POA: Diagnosis not present

## 2017-09-22 DIAGNOSIS — I5022 Chronic systolic (congestive) heart failure: Secondary | ICD-10-CM | POA: Diagnosis not present

## 2017-09-22 DIAGNOSIS — A419 Sepsis, unspecified organism: Secondary | ICD-10-CM | POA: Diagnosis not present

## 2017-09-22 DIAGNOSIS — E118 Type 2 diabetes mellitus with unspecified complications: Secondary | ICD-10-CM | POA: Diagnosis not present

## 2017-09-22 DIAGNOSIS — R6521 Severe sepsis with septic shock: Secondary | ICD-10-CM | POA: Diagnosis not present

## 2017-09-22 DIAGNOSIS — D62 Acute posthemorrhagic anemia: Secondary | ICD-10-CM | POA: Diagnosis not present

## 2017-09-22 DIAGNOSIS — R419 Unspecified symptoms and signs involving cognitive functions and awareness: Secondary | ICD-10-CM | POA: Diagnosis not present

## 2017-09-22 DIAGNOSIS — R0602 Shortness of breath: Secondary | ICD-10-CM | POA: Diagnosis not present

## 2017-09-22 DIAGNOSIS — I509 Heart failure, unspecified: Secondary | ICD-10-CM | POA: Diagnosis not present

## 2017-09-22 DIAGNOSIS — E871 Hypo-osmolality and hyponatremia: Secondary | ICD-10-CM | POA: Diagnosis not present

## 2017-09-22 DIAGNOSIS — M6281 Muscle weakness (generalized): Secondary | ICD-10-CM | POA: Diagnosis not present

## 2017-09-22 DIAGNOSIS — E11621 Type 2 diabetes mellitus with foot ulcer: Secondary | ICD-10-CM | POA: Diagnosis not present

## 2017-09-22 DIAGNOSIS — G9341 Metabolic encephalopathy: Secondary | ICD-10-CM | POA: Diagnosis not present

## 2017-09-22 DIAGNOSIS — L03115 Cellulitis of right lower limb: Secondary | ICD-10-CM | POA: Diagnosis not present

## 2017-09-22 DIAGNOSIS — I5023 Acute on chronic systolic (congestive) heart failure: Secondary | ICD-10-CM | POA: Diagnosis not present

## 2017-09-22 DIAGNOSIS — R296 Repeated falls: Secondary | ICD-10-CM | POA: Diagnosis not present

## 2017-09-23 DIAGNOSIS — M869 Osteomyelitis, unspecified: Secondary | ICD-10-CM

## 2017-09-24 DIAGNOSIS — M869 Osteomyelitis, unspecified: Secondary | ICD-10-CM

## 2017-09-25 ENCOUNTER — Ambulatory Visit: Payer: Medicare HMO | Admitting: Sports Medicine

## 2017-09-25 ENCOUNTER — Encounter: Payer: Self-pay | Admitting: Sports Medicine

## 2017-09-28 ENCOUNTER — Encounter: Payer: Self-pay | Admitting: Sports Medicine

## 2017-09-28 DIAGNOSIS — M869 Osteomyelitis, unspecified: Secondary | ICD-10-CM

## 2017-10-01 DIAGNOSIS — I509 Heart failure, unspecified: Secondary | ICD-10-CM

## 2017-10-01 DIAGNOSIS — R0602 Shortness of breath: Secondary | ICD-10-CM

## 2017-10-02 DIAGNOSIS — I4891 Unspecified atrial fibrillation: Secondary | ICD-10-CM

## 2017-10-02 DIAGNOSIS — I5022 Chronic systolic (congestive) heart failure: Secondary | ICD-10-CM

## 2017-10-04 DIAGNOSIS — I5022 Chronic systolic (congestive) heart failure: Secondary | ICD-10-CM

## 2017-10-04 DIAGNOSIS — I4891 Unspecified atrial fibrillation: Secondary | ICD-10-CM

## 2017-10-07 DIAGNOSIS — K8020 Calculus of gallbladder without cholecystitis without obstruction: Secondary | ICD-10-CM | POA: Diagnosis not present

## 2017-10-07 DIAGNOSIS — N183 Chronic kidney disease, stage 3 (moderate): Secondary | ICD-10-CM | POA: Diagnosis not present

## 2017-10-07 DIAGNOSIS — I4891 Unspecified atrial fibrillation: Secondary | ICD-10-CM | POA: Diagnosis not present

## 2017-10-07 DIAGNOSIS — J9601 Acute respiratory failure with hypoxia: Secondary | ICD-10-CM | POA: Diagnosis not present

## 2017-10-07 DIAGNOSIS — E039 Hypothyroidism, unspecified: Secondary | ICD-10-CM | POA: Diagnosis not present

## 2017-10-07 DIAGNOSIS — M86171 Other acute osteomyelitis, right ankle and foot: Secondary | ICD-10-CM | POA: Diagnosis not present

## 2017-10-07 DIAGNOSIS — I5022 Chronic systolic (congestive) heart failure: Secondary | ICD-10-CM | POA: Diagnosis not present

## 2017-10-07 DIAGNOSIS — R2689 Other abnormalities of gait and mobility: Secondary | ICD-10-CM | POA: Diagnosis not present

## 2017-10-07 DIAGNOSIS — R799 Abnormal finding of blood chemistry, unspecified: Secondary | ICD-10-CM | POA: Diagnosis not present

## 2017-10-07 DIAGNOSIS — G9341 Metabolic encephalopathy: Secondary | ICD-10-CM | POA: Diagnosis not present

## 2017-10-07 DIAGNOSIS — N189 Chronic kidney disease, unspecified: Secondary | ICD-10-CM | POA: Diagnosis not present

## 2017-10-07 DIAGNOSIS — R531 Weakness: Secondary | ICD-10-CM | POA: Diagnosis not present

## 2017-10-07 DIAGNOSIS — I38 Endocarditis, valve unspecified: Secondary | ICD-10-CM | POA: Diagnosis not present

## 2017-10-07 DIAGNOSIS — L89150 Pressure ulcer of sacral region, unstageable: Secondary | ICD-10-CM | POA: Diagnosis not present

## 2017-10-07 DIAGNOSIS — L98499 Non-pressure chronic ulcer of skin of other sites with unspecified severity: Secondary | ICD-10-CM | POA: Diagnosis not present

## 2017-10-07 DIAGNOSIS — M869 Osteomyelitis, unspecified: Secondary | ICD-10-CM | POA: Diagnosis not present

## 2017-10-07 DIAGNOSIS — N179 Acute kidney failure, unspecified: Secondary | ICD-10-CM | POA: Diagnosis not present

## 2017-10-07 DIAGNOSIS — I5023 Acute on chronic systolic (congestive) heart failure: Secondary | ICD-10-CM | POA: Diagnosis not present

## 2017-10-07 DIAGNOSIS — I130 Hypertensive heart and chronic kidney disease with heart failure and stage 1 through stage 4 chronic kidney disease, or unspecified chronic kidney disease: Secondary | ICD-10-CM | POA: Diagnosis not present

## 2017-10-07 DIAGNOSIS — L97509 Non-pressure chronic ulcer of other part of unspecified foot with unspecified severity: Secondary | ICD-10-CM | POA: Diagnosis not present

## 2017-10-07 DIAGNOSIS — R2681 Unsteadiness on feet: Secondary | ICD-10-CM | POA: Diagnosis not present

## 2017-10-07 DIAGNOSIS — E440 Moderate protein-calorie malnutrition: Secondary | ICD-10-CM | POA: Diagnosis not present

## 2017-10-07 DIAGNOSIS — E1122 Type 2 diabetes mellitus with diabetic chronic kidney disease: Secondary | ICD-10-CM | POA: Diagnosis not present

## 2017-10-07 DIAGNOSIS — I509 Heart failure, unspecified: Secondary | ICD-10-CM | POA: Diagnosis not present

## 2017-10-07 DIAGNOSIS — M6281 Muscle weakness (generalized): Secondary | ICD-10-CM | POA: Diagnosis not present

## 2017-10-07 DIAGNOSIS — E11621 Type 2 diabetes mellitus with foot ulcer: Secondary | ICD-10-CM | POA: Diagnosis not present

## 2017-10-07 DIAGNOSIS — R079 Chest pain, unspecified: Secondary | ICD-10-CM | POA: Diagnosis not present

## 2017-10-07 DIAGNOSIS — A419 Sepsis, unspecified organism: Secondary | ICD-10-CM | POA: Diagnosis not present

## 2017-10-07 DIAGNOSIS — I482 Chronic atrial fibrillation: Secondary | ICD-10-CM | POA: Diagnosis not present

## 2017-10-07 DIAGNOSIS — L89154 Pressure ulcer of sacral region, stage 4: Secondary | ICD-10-CM | POA: Diagnosis not present

## 2017-10-07 DIAGNOSIS — N390 Urinary tract infection, site not specified: Secondary | ICD-10-CM | POA: Diagnosis not present

## 2017-10-07 DIAGNOSIS — I959 Hypotension, unspecified: Secondary | ICD-10-CM | POA: Diagnosis not present

## 2017-10-07 DIAGNOSIS — R6520 Severe sepsis without septic shock: Secondary | ICD-10-CM | POA: Diagnosis not present

## 2017-10-07 DIAGNOSIS — L89152 Pressure ulcer of sacral region, stage 2: Secondary | ICD-10-CM | POA: Diagnosis not present

## 2017-10-07 DIAGNOSIS — B488 Other specified mycoses: Secondary | ICD-10-CM | POA: Diagnosis not present

## 2017-10-07 DIAGNOSIS — N289 Disorder of kidney and ureter, unspecified: Secondary | ICD-10-CM | POA: Diagnosis not present

## 2017-10-07 DIAGNOSIS — E871 Hypo-osmolality and hyponatremia: Secondary | ICD-10-CM | POA: Diagnosis not present

## 2017-10-07 DIAGNOSIS — E118 Type 2 diabetes mellitus with unspecified complications: Secondary | ICD-10-CM | POA: Diagnosis not present

## 2017-10-07 DIAGNOSIS — R296 Repeated falls: Secondary | ICD-10-CM | POA: Diagnosis not present

## 2017-10-08 ENCOUNTER — Telehealth: Payer: Self-pay | Admitting: *Deleted

## 2017-10-08 DIAGNOSIS — L98499 Non-pressure chronic ulcer of skin of other sites with unspecified severity: Secondary | ICD-10-CM | POA: Diagnosis not present

## 2017-10-08 DIAGNOSIS — L89150 Pressure ulcer of sacral region, unstageable: Secondary | ICD-10-CM | POA: Diagnosis not present

## 2017-10-08 NOTE — Telephone Encounter (Signed)
Called to [facility], PT is gone for the day.

## 2017-10-08 NOTE — Telephone Encounter (Signed)
Iona Coach, PT asked for weight bearing status, is he able to bear weight on his heel.

## 2017-10-08 NOTE — Telephone Encounter (Signed)
Yes he can bear weight to heel

## 2017-10-12 DIAGNOSIS — N189 Chronic kidney disease, unspecified: Secondary | ICD-10-CM | POA: Diagnosis not present

## 2017-10-12 DIAGNOSIS — N183 Chronic kidney disease, stage 3 (moderate): Secondary | ICD-10-CM | POA: Diagnosis not present

## 2017-10-12 DIAGNOSIS — Z743 Need for continuous supervision: Secondary | ICD-10-CM | POA: Diagnosis not present

## 2017-10-12 DIAGNOSIS — M869 Osteomyelitis, unspecified: Secondary | ICD-10-CM | POA: Diagnosis not present

## 2017-10-12 DIAGNOSIS — Z452 Encounter for adjustment and management of vascular access device: Secondary | ICD-10-CM | POA: Diagnosis not present

## 2017-10-12 DIAGNOSIS — E440 Moderate protein-calorie malnutrition: Secondary | ICD-10-CM | POA: Diagnosis not present

## 2017-10-12 DIAGNOSIS — I480 Paroxysmal atrial fibrillation: Secondary | ICD-10-CM | POA: Diagnosis not present

## 2017-10-12 DIAGNOSIS — R279 Unspecified lack of coordination: Secondary | ICD-10-CM | POA: Diagnosis not present

## 2017-10-12 DIAGNOSIS — K8020 Calculus of gallbladder without cholecystitis without obstruction: Secondary | ICD-10-CM | POA: Diagnosis not present

## 2017-10-12 DIAGNOSIS — N289 Disorder of kidney and ureter, unspecified: Secondary | ICD-10-CM | POA: Diagnosis not present

## 2017-10-12 DIAGNOSIS — I5023 Acute on chronic systolic (congestive) heart failure: Secondary | ICD-10-CM | POA: Diagnosis not present

## 2017-10-12 DIAGNOSIS — I4891 Unspecified atrial fibrillation: Secondary | ICD-10-CM | POA: Diagnosis not present

## 2017-10-12 DIAGNOSIS — R7881 Bacteremia: Secondary | ICD-10-CM | POA: Diagnosis not present

## 2017-10-12 DIAGNOSIS — I509 Heart failure, unspecified: Secondary | ICD-10-CM | POA: Diagnosis not present

## 2017-10-12 DIAGNOSIS — R1011 Right upper quadrant pain: Secondary | ICD-10-CM | POA: Diagnosis not present

## 2017-10-12 DIAGNOSIS — I959 Hypotension, unspecified: Secondary | ICD-10-CM | POA: Diagnosis not present

## 2017-10-12 DIAGNOSIS — R6520 Severe sepsis without septic shock: Secondary | ICD-10-CM | POA: Diagnosis not present

## 2017-10-12 DIAGNOSIS — E119 Type 2 diabetes mellitus without complications: Secondary | ICD-10-CM | POA: Diagnosis not present

## 2017-10-12 DIAGNOSIS — A419 Sepsis, unspecified organism: Secondary | ICD-10-CM | POA: Diagnosis not present

## 2017-10-12 DIAGNOSIS — I5022 Chronic systolic (congestive) heart failure: Secondary | ICD-10-CM | POA: Diagnosis not present

## 2017-10-12 DIAGNOSIS — D649 Anemia, unspecified: Secondary | ICD-10-CM | POA: Diagnosis not present

## 2017-10-12 DIAGNOSIS — B488 Other specified mycoses: Secondary | ICD-10-CM | POA: Diagnosis not present

## 2017-10-12 DIAGNOSIS — L89152 Pressure ulcer of sacral region, stage 2: Secondary | ICD-10-CM | POA: Diagnosis not present

## 2017-10-12 DIAGNOSIS — L89154 Pressure ulcer of sacral region, stage 4: Secondary | ICD-10-CM | POA: Diagnosis not present

## 2017-10-12 DIAGNOSIS — J9601 Acute respiratory failure with hypoxia: Secondary | ICD-10-CM | POA: Diagnosis not present

## 2017-10-12 DIAGNOSIS — I130 Hypertensive heart and chronic kidney disease with heart failure and stage 1 through stage 4 chronic kidney disease, or unspecified chronic kidney disease: Secondary | ICD-10-CM | POA: Diagnosis not present

## 2017-10-12 DIAGNOSIS — R079 Chest pain, unspecified: Secondary | ICD-10-CM | POA: Diagnosis not present

## 2017-10-12 DIAGNOSIS — I38 Endocarditis, valve unspecified: Secondary | ICD-10-CM | POA: Diagnosis not present

## 2017-10-12 DIAGNOSIS — E1122 Type 2 diabetes mellitus with diabetic chronic kidney disease: Secondary | ICD-10-CM | POA: Diagnosis not present

## 2017-10-12 DIAGNOSIS — N390 Urinary tract infection, site not specified: Secondary | ICD-10-CM | POA: Diagnosis not present

## 2017-10-12 DIAGNOSIS — R0902 Hypoxemia: Secondary | ICD-10-CM | POA: Diagnosis not present

## 2017-10-12 DIAGNOSIS — J189 Pneumonia, unspecified organism: Secondary | ICD-10-CM | POA: Diagnosis not present

## 2017-10-12 DIAGNOSIS — N179 Acute kidney failure, unspecified: Secondary | ICD-10-CM | POA: Diagnosis not present

## 2017-10-12 DIAGNOSIS — I2510 Atherosclerotic heart disease of native coronary artery without angina pectoris: Secondary | ICD-10-CM | POA: Diagnosis not present

## 2017-10-12 DIAGNOSIS — R799 Abnormal finding of blood chemistry, unspecified: Secondary | ICD-10-CM | POA: Diagnosis not present

## 2017-10-14 ENCOUNTER — Ambulatory Visit: Payer: Medicare HMO | Admitting: Sports Medicine

## 2017-10-16 DIAGNOSIS — R079 Chest pain, unspecified: Secondary | ICD-10-CM

## 2017-10-18 DIAGNOSIS — M869 Osteomyelitis, unspecified: Secondary | ICD-10-CM

## 2017-10-19 DIAGNOSIS — R079 Chest pain, unspecified: Secondary | ICD-10-CM

## 2017-10-20 DIAGNOSIS — I509 Heart failure, unspecified: Secondary | ICD-10-CM

## 2017-10-20 DIAGNOSIS — I4891 Unspecified atrial fibrillation: Secondary | ICD-10-CM

## 2017-10-22 DIAGNOSIS — I5022 Chronic systolic (congestive) heart failure: Secondary | ICD-10-CM

## 2017-10-23 DIAGNOSIS — I5022 Chronic systolic (congestive) heart failure: Secondary | ICD-10-CM

## 2017-10-25 DIAGNOSIS — I4891 Unspecified atrial fibrillation: Secondary | ICD-10-CM

## 2017-10-29 DIAGNOSIS — M869 Osteomyelitis, unspecified: Secondary | ICD-10-CM | POA: Diagnosis not present

## 2017-10-29 DIAGNOSIS — I5022 Chronic systolic (congestive) heart failure: Secondary | ICD-10-CM | POA: Diagnosis not present

## 2017-10-29 DIAGNOSIS — I480 Paroxysmal atrial fibrillation: Secondary | ICD-10-CM | POA: Diagnosis not present

## 2017-10-29 DIAGNOSIS — Z743 Need for continuous supervision: Secondary | ICD-10-CM | POA: Diagnosis not present

## 2017-10-29 DIAGNOSIS — D649 Anemia, unspecified: Secondary | ICD-10-CM | POA: Diagnosis not present

## 2017-10-29 DIAGNOSIS — R279 Unspecified lack of coordination: Secondary | ICD-10-CM | POA: Diagnosis not present

## 2017-10-30 DIAGNOSIS — I5022 Chronic systolic (congestive) heart failure: Secondary | ICD-10-CM | POA: Diagnosis not present

## 2017-10-30 DIAGNOSIS — M869 Osteomyelitis, unspecified: Secondary | ICD-10-CM | POA: Diagnosis not present

## 2017-10-30 DIAGNOSIS — E119 Type 2 diabetes mellitus without complications: Secondary | ICD-10-CM | POA: Diagnosis not present

## 2017-10-30 DIAGNOSIS — N183 Chronic kidney disease, stage 3 (moderate): Secondary | ICD-10-CM | POA: Diagnosis not present

## 2017-11-02 DIAGNOSIS — E785 Hyperlipidemia, unspecified: Secondary | ICD-10-CM | POA: Diagnosis not present

## 2017-11-02 DIAGNOSIS — E119 Type 2 diabetes mellitus without complications: Secondary | ICD-10-CM | POA: Diagnosis not present

## 2017-11-02 DIAGNOSIS — D649 Anemia, unspecified: Secondary | ICD-10-CM | POA: Diagnosis not present

## 2017-11-02 DIAGNOSIS — R4182 Altered mental status, unspecified: Secondary | ICD-10-CM | POA: Diagnosis not present

## 2017-11-02 DIAGNOSIS — E039 Hypothyroidism, unspecified: Secondary | ICD-10-CM | POA: Diagnosis not present

## 2017-11-02 DIAGNOSIS — R52 Pain, unspecified: Secondary | ICD-10-CM | POA: Diagnosis not present

## 2017-11-02 DIAGNOSIS — Z79899 Other long term (current) drug therapy: Secondary | ICD-10-CM | POA: Diagnosis not present

## 2017-11-03 DIAGNOSIS — R319 Hematuria, unspecified: Secondary | ICD-10-CM | POA: Diagnosis not present

## 2017-11-03 DIAGNOSIS — N390 Urinary tract infection, site not specified: Secondary | ICD-10-CM | POA: Diagnosis not present

## 2017-11-03 DIAGNOSIS — A499 Bacterial infection, unspecified: Secondary | ICD-10-CM | POA: Diagnosis not present

## 2017-11-04 DIAGNOSIS — D649 Anemia, unspecified: Secondary | ICD-10-CM | POA: Diagnosis not present

## 2017-11-04 DIAGNOSIS — R4182 Altered mental status, unspecified: Secondary | ICD-10-CM | POA: Diagnosis not present

## 2017-11-04 DIAGNOSIS — I10 Essential (primary) hypertension: Secondary | ICD-10-CM | POA: Diagnosis not present

## 2017-11-04 DIAGNOSIS — R319 Hematuria, unspecified: Secondary | ICD-10-CM | POA: Diagnosis not present

## 2017-11-06 DIAGNOSIS — N183 Chronic kidney disease, stage 3 (moderate): Secondary | ICD-10-CM | POA: Diagnosis not present

## 2017-11-06 DIAGNOSIS — N390 Urinary tract infection, site not specified: Secondary | ICD-10-CM | POA: Diagnosis not present

## 2017-11-06 DIAGNOSIS — I5022 Chronic systolic (congestive) heart failure: Secondary | ICD-10-CM | POA: Diagnosis not present

## 2017-11-06 DIAGNOSIS — I10 Essential (primary) hypertension: Secondary | ICD-10-CM | POA: Diagnosis not present

## 2017-11-06 DIAGNOSIS — E119 Type 2 diabetes mellitus without complications: Secondary | ICD-10-CM | POA: Diagnosis not present

## 2017-11-06 DIAGNOSIS — D649 Anemia, unspecified: Secondary | ICD-10-CM | POA: Diagnosis not present

## 2017-11-10 DIAGNOSIS — M79671 Pain in right foot: Secondary | ICD-10-CM | POA: Diagnosis not present

## 2017-11-10 DIAGNOSIS — L03031 Cellulitis of right toe: Secondary | ICD-10-CM | POA: Diagnosis not present

## 2017-11-10 DIAGNOSIS — I5022 Chronic systolic (congestive) heart failure: Secondary | ICD-10-CM | POA: Diagnosis not present

## 2017-11-10 DIAGNOSIS — D649 Anemia, unspecified: Secondary | ICD-10-CM | POA: Diagnosis not present

## 2017-11-10 DIAGNOSIS — I10 Essential (primary) hypertension: Secondary | ICD-10-CM | POA: Diagnosis not present

## 2017-11-10 DIAGNOSIS — M869 Osteomyelitis, unspecified: Secondary | ICD-10-CM | POA: Diagnosis not present

## 2017-11-10 DIAGNOSIS — R918 Other nonspecific abnormal finding of lung field: Secondary | ICD-10-CM | POA: Diagnosis not present

## 2017-11-10 DIAGNOSIS — N183 Chronic kidney disease, stage 3 (moderate): Secondary | ICD-10-CM | POA: Diagnosis not present

## 2017-11-11 ENCOUNTER — Ambulatory Visit (INDEPENDENT_AMBULATORY_CARE_PROVIDER_SITE_OTHER): Payer: Medicare HMO | Admitting: Sports Medicine

## 2017-11-11 ENCOUNTER — Other Ambulatory Visit: Payer: Self-pay | Admitting: Sports Medicine

## 2017-11-11 ENCOUNTER — Ambulatory Visit (INDEPENDENT_AMBULATORY_CARE_PROVIDER_SITE_OTHER): Payer: Medicare HMO

## 2017-11-11 ENCOUNTER — Encounter: Payer: Self-pay | Admitting: Sports Medicine

## 2017-11-11 DIAGNOSIS — I739 Peripheral vascular disease, unspecified: Secondary | ICD-10-CM | POA: Diagnosis not present

## 2017-11-11 DIAGNOSIS — E1142 Type 2 diabetes mellitus with diabetic polyneuropathy: Secondary | ICD-10-CM

## 2017-11-11 DIAGNOSIS — Z89419 Acquired absence of unspecified great toe: Secondary | ICD-10-CM | POA: Diagnosis not present

## 2017-11-11 DIAGNOSIS — M79671 Pain in right foot: Secondary | ICD-10-CM

## 2017-11-11 DIAGNOSIS — M86171 Other acute osteomyelitis, right ankle and foot: Secondary | ICD-10-CM | POA: Diagnosis not present

## 2017-11-11 NOTE — Progress Notes (Signed)
Subjective: Jose Heath is a 67 y.o. male patient seen today in office for POV #1 date of surgery 09/28/2017 at [facility], right great toe amputation secondary to osteomyelitis and chronic ulceration.  Patient denies pain at surgical site, denies calf pain, denies headache, chest pain, shortness of breath, nausea, vomiting, fever, or chills. Patient is currently inpatient at [facility] and rehab where they have been reapplying the wound VAC to the right foot however patient reports that they have had difficulty with doing this properly and that the machine has been alarming.  Patient does report that he has been experiencing more swelling.  Denies nausea, vomiting, fever, chills night sweats, shortness of breath or chest pain.  No other issues noted.   Patient reports that he has currently completed all of his antibiotics and tentative discharge date of Thursday, 11/19/2017.  Patient Active Problem List   Diagnosis Date Noted  . Arthritis 09/17/2016  . Atrial flutter ([place]) 09/17/2016  . Coronary artery disease 09/17/2016  . Diabetes mellitus ([place]) 09/17/2016  . Disease of thyroid gland 09/17/2016  . HOH (hard of hearing) 09/17/2016  . Hyperlipidemia 09/17/2016  . ICD (implantable cardioverter-defibrillator) in place 09/17/2016  . Left foot pain 09/17/2016  . Skin cancer 09/17/2016  . TSH (thyroid-stimulating hormone deficiency) 08/13/2016  . Anasarca 07/30/2016  . Charcot foot due to diabetes mellitus ([place]) 02/13/2016  . Atypical atrial flutter ([place]) 10/04/2015  . Essential hypertension 10/04/2015  . Ischemic cardiomyopathy 10/04/2015  . Pure hypercholesterolemia 10/04/2015  . Amputated toe of left foot ([place]) 08/15/2015  . Diabetic peripheral neuropathy ([place]) 08/15/2015  . Type 2 diabetes mellitus with Charcot's joint of left foot ([place]) 08/15/2015  . Cardiac defibrillator in place 05/29/2015  . Cardiomyopathy ([place]) 05/29/2015  . Dual ICD (implantable  cardioverter-defibrillator) in place 05/29/2015    Current Outpatient Medications on File Prior to Visit  Medication Sig Dispense Refill  . amiodarone (PACERONE) 200 MG tablet Take 200 mg by mouth daily.  1  . amoxicillin-clavulanate (AUGMENTIN) 875-125 MG tablet Take 1 tablet by mouth 2 (two) times daily. 28 tablet 0  . apixaban (ELIQUIS) 5 MG TABS tablet Take 5 mg by mouth.    Marland Kitchen atorvastatin (LIPITOR) 40 MG tablet TAKE 1 TABLET ONCE DAILY.    Marland Kitchen atorvastatin (LIPITOR) 40 MG tablet Take 40 mg by mouth daily.  0  . becaplermin (REGRANEX) 0.01 % gel Apply to affected area 3 times week. 15 g 3  . carvedilol (COREG) 6.25 MG tablet Take 6.25 mg by mouth 2 (two) times daily.  1  . ciprofloxacin (CIPRO) 500 MG tablet Take 1 tablet (500 mg total) by mouth 2 (two) times daily. 28 tablet 0  . Delafloxacin Meglumine (BAXDELA) 450 MG TABS Take 1 tablet by mouth 2 (two) times daily. 20 tablet 0  . doxycycline (VIBRAMYCIN) 100 MG capsule Take 1 capsule (100 mg total) by mouth 2 (two) times daily. 28 capsule 0  . ELIQUIS 2.5 MG TABS tablet TAKE ONE TABLET BY MOUTH TWICE DAILY FOR afib  0  . furosemide (LASIX) 40 MG tablet Take 40 mg by mouth 2 (two) times daily.  2  . glimepiride (AMARYL) 2 MG tablet Take 2 mg by mouth.    Colbert Ewing 1 g injection See admin instructions.  0  . IRON PO Take 325 mg by mouth.    . levofloxacin (LEVAQUIN) 500 MG tablet Take 1 tablet (500 mg total) by mouth daily. 10 tablet 0  . levothyroxine (SYNTHROID, LEVOTHROID) 100 MCG  tablet Take by mouth.    . levothyroxine (SYNTHROID, LEVOTHROID) 100 MCG tablet Take 100 mcg by mouth every morning.  6  . levothyroxine (SYNTHROID, LEVOTHROID) 88 MCG tablet Take by mouth.    . levothyroxine (SYNTHROID, LEVOTHROID) 88 MCG tablet Take 88 mcg by mouth daily.  0  . Linezolid in Sodium Chloride 600-0.9 MG/300ML-% SOLN     . losartan (COZAAR) 25 MG tablet Take 25 mg by mouth.    . losartan (COZAAR) 50 MG tablet Take 50 mg by mouth.    .  metFORMIN (GLUCOPHAGE) 1000 MG tablet Take 1,000 mg by mouth 2 (two) times daily with a meal.  0  . mupirocin ointment (BACTROBAN) 2 %     . simvastatin (ZOCOR) 20 MG tablet Take by mouth.    . spironolactone (ALDACTONE) 25 MG tablet Take 25 mg by mouth daily.  6  . torsemide (DEMADEX) 20 MG tablet TAKE 1 OR 2 TABLETS BY MOUTH EVERY DAY AS DIRECTED  6  . XYLOCAINE 1 % (with preservative) injection USE 3.42ml with invanz FOR injection  0   No current facility-administered medications on file prior to visit.     Not on File  Objective: There were no vitals filed for this visit.  General: No acute distress, AAOx3  Right foot: There is a full-thickness amputation wound noted at the site of previous hallux amputation with a mix of fiber granular tissue that measures 5.5 x 4.6 x 0.8 cm with mild surrounding periwound blanchable erythema, edema, maceration.  There is also a pinpoint ulceration noted at the proximal incision line on the right foot and also at the second toe over the dorsal aspect of the interphalangeal joint with fibrotic wound base with mild blanchable erythema and edema no other acute signs of infection.  Neurovascular status diminished with difficulty with palpation of DP and PT pedal pulses due to edema.  No pain with calf compression.   Post Op Xray, Right foot consistent with amputation status of hallux no other acute findings noted.  Assessment and Plan:  Problem List Items Addressed This Visit    None    Visit Diagnoses    History of amputation of hallux ([place])    -  Primary   Relevant Orders   DG Foot Complete Right   Acute osteomyelitis of toe of right foot ([facility])       Right foot pain       PVD (peripheral vascular disease) ([place])       Diabetic polyneuropathy associated with type 2 diabetes mellitus ([place])           -Patient seen and evaluated -X-rays reviewed -Applied Prisma and dry sterile dressing to surgical site right foot secured with stockinet  -Advised  patient to make sure to keep dressings clean, dry, and intact to right foot allowing nursing at [facility] to change dressing may resume wound VAC therapy on Friday.  Was not able to reapply wound VAC in office due to patient not bringing his wound VAC supplies and the current wound VAC was noted to be alarming due to low battery and not properly connected. -Advised patient to continue with wheelchair and ambulation only with assistance from staff and physical therapy -Advised patient to limit activity to necessity  -Advised patient to elevate to assist with edema control -Will plan for continued wound care at next office visit. In the meantime, patient to call office if any issues or problems arise.   [name], DPM

## 2017-11-16 ENCOUNTER — Telehealth: Payer: Self-pay | Admitting: *Deleted

## 2017-11-16 DIAGNOSIS — I5022 Chronic systolic (congestive) heart failure: Secondary | ICD-10-CM | POA: Diagnosis not present

## 2017-11-16 DIAGNOSIS — R05 Cough: Secondary | ICD-10-CM | POA: Diagnosis not present

## 2017-11-16 DIAGNOSIS — R600 Localized edema: Secondary | ICD-10-CM | POA: Diagnosis not present

## 2017-11-16 NOTE — Telephone Encounter (Signed)
Not sure if I can speak to her about this. I didn't see where she was listed on his chart. If anything have the sister have the Rehab center to call me for orders for his foot Thanks Dr. Chauncey Cruel

## 2017-11-16 NOTE — Telephone Encounter (Signed)
Patient had his sister stop by requesting you call he is concerned because they are not taking care of his foot appropriately.  They have not put the wound vac on it 3 days foot is draining heavily and very swollen.  [phone]

## 2017-11-17 DIAGNOSIS — I10 Essential (primary) hypertension: Secondary | ICD-10-CM | POA: Diagnosis not present

## 2017-11-17 DIAGNOSIS — E46 Unspecified protein-calorie malnutrition: Secondary | ICD-10-CM | POA: Diagnosis not present

## 2017-11-17 DIAGNOSIS — D649 Anemia, unspecified: Secondary | ICD-10-CM | POA: Diagnosis not present

## 2017-11-17 DIAGNOSIS — I480 Paroxysmal atrial fibrillation: Secondary | ICD-10-CM | POA: Diagnosis not present

## 2017-11-17 NOTE — Telephone Encounter (Signed)
Dr. Cannon Kettle I'm sorry I read my message again and it was not clear the cell number is Mr Hitchcock's he requested his sister ask you call him

## 2017-11-17 NOTE — Telephone Encounter (Signed)
I will give him a call when I get a chance this afternoon or 1st thing in the morning

## 2017-11-18 DIAGNOSIS — I5022 Chronic systolic (congestive) heart failure: Secondary | ICD-10-CM | POA: Diagnosis not present

## 2017-11-18 DIAGNOSIS — E119 Type 2 diabetes mellitus without complications: Secondary | ICD-10-CM | POA: Diagnosis not present

## 2017-11-18 DIAGNOSIS — M869 Osteomyelitis, unspecified: Secondary | ICD-10-CM | POA: Diagnosis not present

## 2017-11-18 DIAGNOSIS — K819 Cholecystitis, unspecified: Secondary | ICD-10-CM | POA: Diagnosis not present

## 2017-11-18 NOTE — Telephone Encounter (Signed)
Talked with patient who reports that the facility was able to replace the wound vac.Patient reports that he will go home next week. I advised patient that I will see him in office on next week for re-assessment of wound.  -Dr. Cannon Kettle

## 2017-11-19 DIAGNOSIS — I10 Essential (primary) hypertension: Secondary | ICD-10-CM | POA: Diagnosis not present

## 2017-11-19 DIAGNOSIS — D649 Anemia, unspecified: Secondary | ICD-10-CM | POA: Diagnosis not present

## 2017-11-23 DIAGNOSIS — R319 Hematuria, unspecified: Secondary | ICD-10-CM | POA: Diagnosis not present

## 2017-11-23 DIAGNOSIS — R4182 Altered mental status, unspecified: Secondary | ICD-10-CM | POA: Diagnosis not present

## 2017-11-23 DIAGNOSIS — Z79899 Other long term (current) drug therapy: Secondary | ICD-10-CM | POA: Diagnosis not present

## 2017-11-25 ENCOUNTER — Telehealth: Payer: Self-pay | Admitting: *Deleted

## 2017-11-25 DIAGNOSIS — E119 Type 2 diabetes mellitus without complications: Secondary | ICD-10-CM | POA: Diagnosis not present

## 2017-11-25 DIAGNOSIS — N390 Urinary tract infection, site not specified: Secondary | ICD-10-CM | POA: Diagnosis not present

## 2017-11-25 DIAGNOSIS — I5022 Chronic systolic (congestive) heart failure: Secondary | ICD-10-CM | POA: Diagnosis not present

## 2017-11-25 DIAGNOSIS — M869 Osteomyelitis, unspecified: Secondary | ICD-10-CM | POA: Diagnosis not present

## 2017-11-25 NOTE — Telephone Encounter (Signed)
error 

## 2017-11-26 ENCOUNTER — Encounter: Payer: Self-pay | Admitting: Sports Medicine

## 2017-11-26 ENCOUNTER — Ambulatory Visit (INDEPENDENT_AMBULATORY_CARE_PROVIDER_SITE_OTHER): Payer: Medicare HMO | Admitting: Sports Medicine

## 2017-11-26 DIAGNOSIS — Z89421 Acquired absence of other right toe(s): Secondary | ICD-10-CM

## 2017-11-26 DIAGNOSIS — M79671 Pain in right foot: Secondary | ICD-10-CM

## 2017-11-26 DIAGNOSIS — I739 Peripheral vascular disease, unspecified: Secondary | ICD-10-CM

## 2017-11-26 DIAGNOSIS — L97512 Non-pressure chronic ulcer of other part of right foot with fat layer exposed: Secondary | ICD-10-CM

## 2017-11-26 DIAGNOSIS — L97511 Non-pressure chronic ulcer of other part of right foot limited to breakdown of skin: Secondary | ICD-10-CM

## 2017-11-26 DIAGNOSIS — Z8739 Personal history of other diseases of the musculoskeletal system and connective tissue: Secondary | ICD-10-CM

## 2017-11-26 DIAGNOSIS — R600 Localized edema: Secondary | ICD-10-CM | POA: Diagnosis not present

## 2017-11-26 DIAGNOSIS — Z89419 Acquired absence of unspecified great toe: Secondary | ICD-10-CM

## 2017-11-26 DIAGNOSIS — E1142 Type 2 diabetes mellitus with diabetic polyneuropathy: Secondary | ICD-10-CM

## 2017-11-26 NOTE — Progress Notes (Signed)
Subjective: Jose Heath is a 67 y.o. male patient seen today in office for POV #2 date of surgery 09/28/2017 at [facility], right great toe amputation secondary to osteomyelitis and chronic ulceration.  Patient denies any pain however states that his foot has been swelling a lot and that he thinks it is doing all right, denies calf pain, denies headache, chest pain, shortness of breath, nausea, vomiting, fever, or chills. Patient is currently inpatient at [facility] and rehab where they have been reapplying the wound VAC to the right foot however patient reports that they have had difficulty with doing this properly and that the machine has been alarming and only have been changing about every 3 to 4 days.  Patient does report that he has been experiencing more swelling and they have been trying to work with issues with his kidneys.  Denies nausea, vomiting, fever, chills night sweats, shortness of breath or chest pain.  No other issues noted.   Patient reports that he has currently completed all of his antibiotics and tentative discharge date from rehab has changed now to Saturday, 11/28/2017  Patient Active Problem List   Diagnosis Date Noted  . Arthritis 09/17/2016  . Atrial flutter ([place]) 09/17/2016  . Coronary artery disease 09/17/2016  . Diabetes mellitus ([place]) 09/17/2016  . Disease of thyroid gland 09/17/2016  . HOH (hard of hearing) 09/17/2016  . Hyperlipidemia 09/17/2016  . ICD (implantable cardioverter-defibrillator) in place 09/17/2016  . Left foot pain 09/17/2016  . Skin cancer 09/17/2016  . TSH (thyroid-stimulating hormone deficiency) 08/13/2016  . Anasarca 07/30/2016  . Charcot foot due to diabetes mellitus ([place]) 02/13/2016  . Atypical atrial flutter ([place]) 10/04/2015  . Essential hypertension 10/04/2015  . Ischemic cardiomyopathy 10/04/2015  . Pure hypercholesterolemia 10/04/2015  . Amputated toe of left foot ([place]) 08/15/2015  . Diabetic peripheral neuropathy ([place])  08/15/2015  . Type 2 diabetes mellitus with Charcot's joint of left foot ([place]) 08/15/2015  . Cardiac defibrillator in place 05/29/2015  . Cardiomyopathy ([place]) 05/29/2015  . Dual ICD (implantable cardioverter-defibrillator) in place 05/29/2015    Current Outpatient Medications on File Prior to Visit  Medication Sig Dispense Refill  . amiodarone (PACERONE) 200 MG tablet Take 200 mg by mouth daily.  1  . amoxicillin-clavulanate (AUGMENTIN) 875-125 MG tablet Take 1 tablet by mouth 2 (two) times daily. 28 tablet 0  . apixaban (ELIQUIS) 5 MG TABS tablet Take 5 mg by mouth.    Marland Kitchen atorvastatin (LIPITOR) 40 MG tablet TAKE 1 TABLET ONCE DAILY.    Marland Kitchen atorvastatin (LIPITOR) 40 MG tablet Take 40 mg by mouth daily.  0  . becaplermin (REGRANEX) 0.01 % gel Apply to affected area 3 times week. 15 g 3  . carvedilol (COREG) 6.25 MG tablet Take 6.25 mg by mouth 2 (two) times daily.  1  . ciprofloxacin (CIPRO) 500 MG tablet Take 1 tablet (500 mg total) by mouth 2 (two) times daily. 28 tablet 0  . Delafloxacin Meglumine (BAXDELA) 450 MG TABS Take 1 tablet by mouth 2 (two) times daily. 20 tablet 0  . doxycycline (VIBRAMYCIN) 100 MG capsule Take 1 capsule (100 mg total) by mouth 2 (two) times daily. 28 capsule 0  . ELIQUIS 2.5 MG TABS tablet TAKE ONE TABLET BY MOUTH TWICE DAILY FOR afib  0  . furosemide (LASIX) 40 MG tablet Take 40 mg by mouth 2 (two) times daily.  2  . glimepiride (AMARYL) 2 MG tablet Take 2 mg by mouth.    Colbert Ewing  1 g injection See admin instructions.  0  . IRON PO Take 325 mg by mouth.    . levofloxacin (LEVAQUIN) 500 MG tablet Take 1 tablet (500 mg total) by mouth daily. 10 tablet 0  . levothyroxine (SYNTHROID, LEVOTHROID) 100 MCG tablet Take by mouth.    . levothyroxine (SYNTHROID, LEVOTHROID) 100 MCG tablet Take 100 mcg by mouth every morning.  6  . levothyroxine (SYNTHROID, LEVOTHROID) 88 MCG tablet Take by mouth.    . levothyroxine (SYNTHROID, LEVOTHROID) 88 MCG tablet Take 88 mcg by  mouth daily.  0  . Linezolid in Sodium Chloride 600-0.9 MG/300ML-% SOLN     . losartan (COZAAR) 25 MG tablet Take 25 mg by mouth.    . losartan (COZAAR) 50 MG tablet Take 50 mg by mouth.    . metFORMIN (GLUCOPHAGE) 1000 MG tablet Take 1,000 mg by mouth 2 (two) times daily with a meal.  0  . mupirocin ointment (BACTROBAN) 2 %     . simvastatin (ZOCOR) 20 MG tablet Take by mouth.    . spironolactone (ALDACTONE) 25 MG tablet Take 25 mg by mouth daily.  6  . torsemide (DEMADEX) 20 MG tablet TAKE 1 OR 2 TABLETS BY MOUTH EVERY DAY AS DIRECTED  6  . XYLOCAINE 1 % (with preservative) injection USE 3.61ml with invanz FOR injection  0   No current facility-administered medications on file prior to visit.     Not on File  Objective: There were no vitals filed for this visit.  General: No acute distress, AAOx3  Right foot: There is a full-thickness amputation wound noted at the site of previous hallux amputation with a mix of fiber granular tissue that measures 6 x 4.0 x 0.5 cm with mild surrounding periwound blanchable erythema, edema, maceration.  There is also a pinpoint ulceration noted at the proximal incision line on the right foot and also at the second toe over the dorsal aspect of the interphalangeal joint with fibrotic wound base that measures less than 5 cm with mild blanchable erythema and edema no other acute signs of infection.  Neurovascular status diminished with difficulty with palpation of DP and PT pedal pulses due to edema.  No pain with calf compression.   Assessment and Plan:  Problem List Items Addressed This Visit    None    Visit Diagnoses    History of amputation of hallux ([place])    -  Primary   Right foot ulcer, with fat layer exposed ([place])       Right second toe ulcer, limited to breakdown of skin ([place])       Right foot pain       PVD (peripheral vascular disease) ([place])       Diabetic polyneuropathy associated with type 2 diabetes mellitus ([place])       History of  amputation of lesser toe of right foot ([place])       History of osteomyelitis           -Patient seen and evaluated -Discontinued wound VAC therapy due to blanchable erythema skin irritation and maceration -Applied Prisma and dry sterile dressing to surgical site right foot secured with stockinet advised nursing to continue to do the same daily while at [facility] rehab and then to continue this dressing regiment once at home -Advised patient to continue with wheelchair and ambulation only with assistance from staff and physical therapy -Advised patient to limit activity to necessity  -Advised patient to elevate to assist with edema  control -Will plan for continued wound care at next office visit. In the meantime, patient to call office if any issues or problems arise.   [name], DPM

## 2017-11-29 DIAGNOSIS — Z4781 Encounter for orthopedic aftercare following surgical amputation: Secondary | ICD-10-CM | POA: Diagnosis not present

## 2017-11-29 DIAGNOSIS — I5043 Acute on chronic combined systolic (congestive) and diastolic (congestive) heart failure: Secondary | ICD-10-CM | POA: Diagnosis not present

## 2017-11-29 DIAGNOSIS — L89150 Pressure ulcer of sacral region, unstageable: Secondary | ICD-10-CM | POA: Diagnosis not present

## 2017-11-29 DIAGNOSIS — N183 Chronic kidney disease, stage 3 (moderate): Secondary | ICD-10-CM | POA: Diagnosis not present

## 2017-11-29 DIAGNOSIS — E1122 Type 2 diabetes mellitus with diabetic chronic kidney disease: Secondary | ICD-10-CM | POA: Diagnosis not present

## 2017-11-29 DIAGNOSIS — N390 Urinary tract infection, site not specified: Secondary | ICD-10-CM | POA: Diagnosis not present

## 2017-11-29 DIAGNOSIS — I130 Hypertensive heart and chronic kidney disease with heart failure and stage 1 through stage 4 chronic kidney disease, or unspecified chronic kidney disease: Secondary | ICD-10-CM | POA: Diagnosis not present

## 2017-11-29 DIAGNOSIS — S61412D Laceration without foreign body of left hand, subsequent encounter: Secondary | ICD-10-CM | POA: Diagnosis not present

## 2017-11-29 DIAGNOSIS — I482 Chronic atrial fibrillation: Secondary | ICD-10-CM | POA: Diagnosis not present

## 2017-11-29 DIAGNOSIS — S61211D Laceration without foreign body of left index finger without damage to nail, subsequent encounter: Secondary | ICD-10-CM | POA: Diagnosis not present

## 2017-11-30 DIAGNOSIS — Z4781 Encounter for orthopedic aftercare following surgical amputation: Secondary | ICD-10-CM | POA: Diagnosis not present

## 2017-11-30 DIAGNOSIS — I130 Hypertensive heart and chronic kidney disease with heart failure and stage 1 through stage 4 chronic kidney disease, or unspecified chronic kidney disease: Secondary | ICD-10-CM | POA: Diagnosis not present

## 2017-11-30 DIAGNOSIS — S61211D Laceration without foreign body of left index finger without damage to nail, subsequent encounter: Secondary | ICD-10-CM | POA: Diagnosis not present

## 2017-11-30 DIAGNOSIS — E1122 Type 2 diabetes mellitus with diabetic chronic kidney disease: Secondary | ICD-10-CM | POA: Diagnosis not present

## 2017-11-30 DIAGNOSIS — N183 Chronic kidney disease, stage 3 (moderate): Secondary | ICD-10-CM | POA: Diagnosis not present

## 2017-11-30 DIAGNOSIS — S61412D Laceration without foreign body of left hand, subsequent encounter: Secondary | ICD-10-CM | POA: Diagnosis not present

## 2017-11-30 DIAGNOSIS — I5043 Acute on chronic combined systolic (congestive) and diastolic (congestive) heart failure: Secondary | ICD-10-CM | POA: Diagnosis not present

## 2017-11-30 DIAGNOSIS — I482 Chronic atrial fibrillation: Secondary | ICD-10-CM | POA: Diagnosis not present

## 2017-11-30 DIAGNOSIS — N390 Urinary tract infection, site not specified: Secondary | ICD-10-CM | POA: Diagnosis not present

## 2017-11-30 DIAGNOSIS — L89150 Pressure ulcer of sacral region, unstageable: Secondary | ICD-10-CM | POA: Diagnosis not present

## 2017-12-01 ENCOUNTER — Telehealth: Payer: Self-pay | Admitting: *Deleted

## 2017-12-01 ENCOUNTER — Telehealth: Payer: Self-pay | Admitting: Sports Medicine

## 2017-12-01 DIAGNOSIS — S61412D Laceration without foreign body of left hand, subsequent encounter: Secondary | ICD-10-CM | POA: Diagnosis not present

## 2017-12-01 DIAGNOSIS — S61211D Laceration without foreign body of left index finger without damage to nail, subsequent encounter: Secondary | ICD-10-CM | POA: Diagnosis not present

## 2017-12-01 DIAGNOSIS — Z4781 Encounter for orthopedic aftercare following surgical amputation: Secondary | ICD-10-CM | POA: Diagnosis not present

## 2017-12-01 DIAGNOSIS — E1122 Type 2 diabetes mellitus with diabetic chronic kidney disease: Secondary | ICD-10-CM | POA: Diagnosis not present

## 2017-12-01 DIAGNOSIS — I5043 Acute on chronic combined systolic (congestive) and diastolic (congestive) heart failure: Secondary | ICD-10-CM | POA: Diagnosis not present

## 2017-12-01 DIAGNOSIS — N390 Urinary tract infection, site not specified: Secondary | ICD-10-CM | POA: Diagnosis not present

## 2017-12-01 DIAGNOSIS — L89150 Pressure ulcer of sacral region, unstageable: Secondary | ICD-10-CM | POA: Diagnosis not present

## 2017-12-01 DIAGNOSIS — I482 Chronic atrial fibrillation: Secondary | ICD-10-CM | POA: Diagnosis not present

## 2017-12-01 DIAGNOSIS — N183 Chronic kidney disease, stage 3 (moderate): Secondary | ICD-10-CM | POA: Diagnosis not present

## 2017-12-01 DIAGNOSIS — I130 Hypertensive heart and chronic kidney disease with heart failure and stage 1 through stage 4 chronic kidney disease, or unspecified chronic kidney disease: Secondary | ICD-10-CM | POA: Diagnosis not present

## 2017-12-01 NOTE — Telephone Encounter (Signed)
Jose Heath [facility] states pt has just been discharged from hospital and she needs verbal orders for the wound care.

## 2017-12-01 NOTE — Telephone Encounter (Signed)
They can do passive ROM and strengthening but NOTHING active where he has to get up to put pressure on his right foot -Dr. Chauncey Cruel

## 2017-12-01 NOTE — Telephone Encounter (Signed)
Jose Heath - [facility] called for orders for pt. I told Jose Heath I had faxed the orders to [name].

## 2017-12-01 NOTE — Telephone Encounter (Signed)
Cleanse Right foot amp wound and 2nd toe wound with saline and then apply PRISMA and dry dressing. If there is heavy drainage patient will need daily dressing changes if drainage is mild to moderate can do dressing changes every other day. -Dr. Cannon Kettle

## 2017-12-01 NOTE — Telephone Encounter (Signed)
This is Cytogeneticist, PT with [employer]. I went and saw Mr. Fassnacht today and I am recommending him to be seen 3 times a week for 4 weeks for strengthening exercises and balancing. He is very weak at this time. You can call me back at [phone].

## 2017-12-01 NOTE — Telephone Encounter (Signed)
Suanne Marker [facility] states she is calling for orders. I told her I had faxed to [facility] this morning late or early afternoon, and she states she is not in the office and would like them read to her. I read Dr. Leeanne Rio 12/01/2017 12:54pm orders.

## 2017-12-01 NOTE — Telephone Encounter (Signed)
Faxed orders for wound care to [facility].

## 2017-12-02 DIAGNOSIS — I5043 Acute on chronic combined systolic (congestive) and diastolic (congestive) heart failure: Secondary | ICD-10-CM | POA: Diagnosis not present

## 2017-12-02 DIAGNOSIS — E1122 Type 2 diabetes mellitus with diabetic chronic kidney disease: Secondary | ICD-10-CM | POA: Diagnosis not present

## 2017-12-02 DIAGNOSIS — N183 Chronic kidney disease, stage 3 (moderate): Secondary | ICD-10-CM | POA: Diagnosis not present

## 2017-12-02 DIAGNOSIS — N390 Urinary tract infection, site not specified: Secondary | ICD-10-CM | POA: Diagnosis not present

## 2017-12-02 DIAGNOSIS — L89150 Pressure ulcer of sacral region, unstageable: Secondary | ICD-10-CM | POA: Diagnosis not present

## 2017-12-02 DIAGNOSIS — S61412D Laceration without foreign body of left hand, subsequent encounter: Secondary | ICD-10-CM | POA: Diagnosis not present

## 2017-12-02 DIAGNOSIS — S61211D Laceration without foreign body of left index finger without damage to nail, subsequent encounter: Secondary | ICD-10-CM | POA: Diagnosis not present

## 2017-12-02 DIAGNOSIS — Z4781 Encounter for orthopedic aftercare following surgical amputation: Secondary | ICD-10-CM | POA: Diagnosis not present

## 2017-12-02 DIAGNOSIS — I482 Chronic atrial fibrillation: Secondary | ICD-10-CM | POA: Diagnosis not present

## 2017-12-02 DIAGNOSIS — I130 Hypertensive heart and chronic kidney disease with heart failure and stage 1 through stage 4 chronic kidney disease, or unspecified chronic kidney disease: Secondary | ICD-10-CM | POA: Diagnosis not present

## 2017-12-02 NOTE — Telephone Encounter (Signed)
Left message with Dr. Leeanne Rio 12/01/2017 6:06pm PT orders.

## 2017-12-03 DIAGNOSIS — L89150 Pressure ulcer of sacral region, unstageable: Secondary | ICD-10-CM | POA: Diagnosis not present

## 2017-12-03 DIAGNOSIS — Z4781 Encounter for orthopedic aftercare following surgical amputation: Secondary | ICD-10-CM | POA: Diagnosis not present

## 2017-12-03 DIAGNOSIS — I10 Essential (primary) hypertension: Secondary | ICD-10-CM | POA: Diagnosis not present

## 2017-12-03 DIAGNOSIS — E1122 Type 2 diabetes mellitus with diabetic chronic kidney disease: Secondary | ICD-10-CM | POA: Diagnosis not present

## 2017-12-03 DIAGNOSIS — I130 Hypertensive heart and chronic kidney disease with heart failure and stage 1 through stage 4 chronic kidney disease, or unspecified chronic kidney disease: Secondary | ICD-10-CM | POA: Diagnosis not present

## 2017-12-03 DIAGNOSIS — S61412D Laceration without foreign body of left hand, subsequent encounter: Secondary | ICD-10-CM | POA: Diagnosis not present

## 2017-12-03 DIAGNOSIS — E1165 Type 2 diabetes mellitus with hyperglycemia: Secondary | ICD-10-CM | POA: Diagnosis not present

## 2017-12-03 DIAGNOSIS — D649 Anemia, unspecified: Secondary | ICD-10-CM | POA: Diagnosis not present

## 2017-12-03 DIAGNOSIS — S61211D Laceration without foreign body of left index finger without damage to nail, subsequent encounter: Secondary | ICD-10-CM | POA: Diagnosis not present

## 2017-12-03 DIAGNOSIS — E785 Hyperlipidemia, unspecified: Secondary | ICD-10-CM | POA: Diagnosis not present

## 2017-12-03 DIAGNOSIS — I5043 Acute on chronic combined systolic (congestive) and diastolic (congestive) heart failure: Secondary | ICD-10-CM | POA: Diagnosis not present

## 2017-12-03 DIAGNOSIS — N390 Urinary tract infection, site not specified: Secondary | ICD-10-CM | POA: Diagnosis not present

## 2017-12-03 DIAGNOSIS — N189 Chronic kidney disease, unspecified: Secondary | ICD-10-CM | POA: Diagnosis not present

## 2017-12-03 DIAGNOSIS — Z09 Encounter for follow-up examination after completed treatment for conditions other than malignant neoplasm: Secondary | ICD-10-CM | POA: Diagnosis not present

## 2017-12-03 DIAGNOSIS — N183 Chronic kidney disease, stage 3 (moderate): Secondary | ICD-10-CM | POA: Diagnosis not present

## 2017-12-03 DIAGNOSIS — I482 Chronic atrial fibrillation: Secondary | ICD-10-CM | POA: Diagnosis not present

## 2017-12-04 DIAGNOSIS — I130 Hypertensive heart and chronic kidney disease with heart failure and stage 1 through stage 4 chronic kidney disease, or unspecified chronic kidney disease: Secondary | ICD-10-CM | POA: Diagnosis not present

## 2017-12-04 DIAGNOSIS — I5043 Acute on chronic combined systolic (congestive) and diastolic (congestive) heart failure: Secondary | ICD-10-CM | POA: Diagnosis not present

## 2017-12-04 DIAGNOSIS — I482 Chronic atrial fibrillation: Secondary | ICD-10-CM | POA: Diagnosis not present

## 2017-12-04 DIAGNOSIS — E1122 Type 2 diabetes mellitus with diabetic chronic kidney disease: Secondary | ICD-10-CM | POA: Diagnosis not present

## 2017-12-04 DIAGNOSIS — N183 Chronic kidney disease, stage 3 (moderate): Secondary | ICD-10-CM | POA: Diagnosis not present

## 2017-12-04 DIAGNOSIS — S61211D Laceration without foreign body of left index finger without damage to nail, subsequent encounter: Secondary | ICD-10-CM | POA: Diagnosis not present

## 2017-12-04 DIAGNOSIS — Z4781 Encounter for orthopedic aftercare following surgical amputation: Secondary | ICD-10-CM | POA: Diagnosis not present

## 2017-12-04 DIAGNOSIS — N390 Urinary tract infection, site not specified: Secondary | ICD-10-CM | POA: Diagnosis not present

## 2017-12-04 DIAGNOSIS — L89150 Pressure ulcer of sacral region, unstageable: Secondary | ICD-10-CM | POA: Diagnosis not present

## 2017-12-04 DIAGNOSIS — S61412D Laceration without foreign body of left hand, subsequent encounter: Secondary | ICD-10-CM | POA: Diagnosis not present

## 2017-12-05 DIAGNOSIS — I482 Chronic atrial fibrillation: Secondary | ICD-10-CM | POA: Diagnosis not present

## 2017-12-05 DIAGNOSIS — S61211D Laceration without foreign body of left index finger without damage to nail, subsequent encounter: Secondary | ICD-10-CM | POA: Diagnosis not present

## 2017-12-05 DIAGNOSIS — I130 Hypertensive heart and chronic kidney disease with heart failure and stage 1 through stage 4 chronic kidney disease, or unspecified chronic kidney disease: Secondary | ICD-10-CM | POA: Diagnosis not present

## 2017-12-05 DIAGNOSIS — E1122 Type 2 diabetes mellitus with diabetic chronic kidney disease: Secondary | ICD-10-CM | POA: Diagnosis not present

## 2017-12-05 DIAGNOSIS — Z4781 Encounter for orthopedic aftercare following surgical amputation: Secondary | ICD-10-CM | POA: Diagnosis not present

## 2017-12-05 DIAGNOSIS — I5043 Acute on chronic combined systolic (congestive) and diastolic (congestive) heart failure: Secondary | ICD-10-CM | POA: Diagnosis not present

## 2017-12-05 DIAGNOSIS — N183 Chronic kidney disease, stage 3 (moderate): Secondary | ICD-10-CM | POA: Diagnosis not present

## 2017-12-05 DIAGNOSIS — N390 Urinary tract infection, site not specified: Secondary | ICD-10-CM | POA: Diagnosis not present

## 2017-12-05 DIAGNOSIS — S61412D Laceration without foreign body of left hand, subsequent encounter: Secondary | ICD-10-CM | POA: Diagnosis not present

## 2017-12-05 DIAGNOSIS — L89150 Pressure ulcer of sacral region, unstageable: Secondary | ICD-10-CM | POA: Diagnosis not present

## 2017-12-06 DIAGNOSIS — Z4781 Encounter for orthopedic aftercare following surgical amputation: Secondary | ICD-10-CM | POA: Diagnosis not present

## 2017-12-06 DIAGNOSIS — I130 Hypertensive heart and chronic kidney disease with heart failure and stage 1 through stage 4 chronic kidney disease, or unspecified chronic kidney disease: Secondary | ICD-10-CM | POA: Diagnosis not present

## 2017-12-06 DIAGNOSIS — I482 Chronic atrial fibrillation: Secondary | ICD-10-CM | POA: Diagnosis not present

## 2017-12-06 DIAGNOSIS — L89150 Pressure ulcer of sacral region, unstageable: Secondary | ICD-10-CM | POA: Diagnosis not present

## 2017-12-06 DIAGNOSIS — N183 Chronic kidney disease, stage 3 (moderate): Secondary | ICD-10-CM | POA: Diagnosis not present

## 2017-12-06 DIAGNOSIS — E1122 Type 2 diabetes mellitus with diabetic chronic kidney disease: Secondary | ICD-10-CM | POA: Diagnosis not present

## 2017-12-06 DIAGNOSIS — S61412D Laceration without foreign body of left hand, subsequent encounter: Secondary | ICD-10-CM | POA: Diagnosis not present

## 2017-12-06 DIAGNOSIS — N390 Urinary tract infection, site not specified: Secondary | ICD-10-CM | POA: Diagnosis not present

## 2017-12-06 DIAGNOSIS — S61211D Laceration without foreign body of left index finger without damage to nail, subsequent encounter: Secondary | ICD-10-CM | POA: Diagnosis not present

## 2017-12-06 DIAGNOSIS — I5043 Acute on chronic combined systolic (congestive) and diastolic (congestive) heart failure: Secondary | ICD-10-CM | POA: Diagnosis not present

## 2017-12-07 DIAGNOSIS — E1122 Type 2 diabetes mellitus with diabetic chronic kidney disease: Secondary | ICD-10-CM | POA: Diagnosis not present

## 2017-12-07 DIAGNOSIS — N390 Urinary tract infection, site not specified: Secondary | ICD-10-CM | POA: Diagnosis not present

## 2017-12-07 DIAGNOSIS — I130 Hypertensive heart and chronic kidney disease with heart failure and stage 1 through stage 4 chronic kidney disease, or unspecified chronic kidney disease: Secondary | ICD-10-CM | POA: Diagnosis not present

## 2017-12-07 DIAGNOSIS — D649 Anemia, unspecified: Secondary | ICD-10-CM | POA: Diagnosis not present

## 2017-12-07 DIAGNOSIS — E785 Hyperlipidemia, unspecified: Secondary | ICD-10-CM | POA: Diagnosis not present

## 2017-12-07 DIAGNOSIS — N183 Chronic kidney disease, stage 3 (moderate): Secondary | ICD-10-CM | POA: Diagnosis not present

## 2017-12-07 DIAGNOSIS — E1165 Type 2 diabetes mellitus with hyperglycemia: Secondary | ICD-10-CM | POA: Diagnosis not present

## 2017-12-07 DIAGNOSIS — S61412D Laceration without foreign body of left hand, subsequent encounter: Secondary | ICD-10-CM | POA: Diagnosis not present

## 2017-12-07 DIAGNOSIS — I482 Chronic atrial fibrillation: Secondary | ICD-10-CM | POA: Diagnosis not present

## 2017-12-07 DIAGNOSIS — I10 Essential (primary) hypertension: Secondary | ICD-10-CM | POA: Diagnosis not present

## 2017-12-07 DIAGNOSIS — Z4781 Encounter for orthopedic aftercare following surgical amputation: Secondary | ICD-10-CM | POA: Diagnosis not present

## 2017-12-07 DIAGNOSIS — L89150 Pressure ulcer of sacral region, unstageable: Secondary | ICD-10-CM | POA: Diagnosis not present

## 2017-12-07 DIAGNOSIS — N189 Chronic kidney disease, unspecified: Secondary | ICD-10-CM | POA: Diagnosis not present

## 2017-12-07 DIAGNOSIS — Z6830 Body mass index (BMI) 30.0-30.9, adult: Secondary | ICD-10-CM | POA: Diagnosis not present

## 2017-12-07 DIAGNOSIS — I5043 Acute on chronic combined systolic (congestive) and diastolic (congestive) heart failure: Secondary | ICD-10-CM | POA: Diagnosis not present

## 2017-12-07 DIAGNOSIS — S61211D Laceration without foreign body of left index finger without damage to nail, subsequent encounter: Secondary | ICD-10-CM | POA: Diagnosis not present

## 2017-12-08 DIAGNOSIS — E1122 Type 2 diabetes mellitus with diabetic chronic kidney disease: Secondary | ICD-10-CM | POA: Diagnosis not present

## 2017-12-08 DIAGNOSIS — I130 Hypertensive heart and chronic kidney disease with heart failure and stage 1 through stage 4 chronic kidney disease, or unspecified chronic kidney disease: Secondary | ICD-10-CM | POA: Diagnosis not present

## 2017-12-08 DIAGNOSIS — S61412D Laceration without foreign body of left hand, subsequent encounter: Secondary | ICD-10-CM | POA: Diagnosis not present

## 2017-12-08 DIAGNOSIS — I5043 Acute on chronic combined systolic (congestive) and diastolic (congestive) heart failure: Secondary | ICD-10-CM | POA: Diagnosis not present

## 2017-12-08 DIAGNOSIS — N183 Chronic kidney disease, stage 3 (moderate): Secondary | ICD-10-CM | POA: Diagnosis not present

## 2017-12-08 DIAGNOSIS — Z4781 Encounter for orthopedic aftercare following surgical amputation: Secondary | ICD-10-CM | POA: Diagnosis not present

## 2017-12-08 DIAGNOSIS — L89150 Pressure ulcer of sacral region, unstageable: Secondary | ICD-10-CM | POA: Diagnosis not present

## 2017-12-08 DIAGNOSIS — N390 Urinary tract infection, site not specified: Secondary | ICD-10-CM | POA: Diagnosis not present

## 2017-12-08 DIAGNOSIS — S61211D Laceration without foreign body of left index finger without damage to nail, subsequent encounter: Secondary | ICD-10-CM | POA: Diagnosis not present

## 2017-12-08 DIAGNOSIS — I482 Chronic atrial fibrillation: Secondary | ICD-10-CM | POA: Diagnosis not present

## 2017-12-09 ENCOUNTER — Ambulatory Visit (INDEPENDENT_AMBULATORY_CARE_PROVIDER_SITE_OTHER): Payer: Medicare HMO | Admitting: Sports Medicine

## 2017-12-09 ENCOUNTER — Encounter: Payer: Self-pay | Admitting: Sports Medicine

## 2017-12-09 DIAGNOSIS — M86171 Other acute osteomyelitis, right ankle and foot: Secondary | ICD-10-CM | POA: Diagnosis not present

## 2017-12-09 DIAGNOSIS — M1990 Unspecified osteoarthritis, unspecified site: Secondary | ICD-10-CM | POA: Diagnosis not present

## 2017-12-09 DIAGNOSIS — L02611 Cutaneous abscess of right foot: Secondary | ICD-10-CM

## 2017-12-09 DIAGNOSIS — E1059 Type 1 diabetes mellitus with other circulatory complications: Secondary | ICD-10-CM | POA: Diagnosis not present

## 2017-12-09 DIAGNOSIS — L03031 Cellulitis of right toe: Secondary | ICD-10-CM

## 2017-12-09 DIAGNOSIS — Z89411 Acquired absence of right great toe: Secondary | ICD-10-CM | POA: Diagnosis not present

## 2017-12-09 DIAGNOSIS — Z89421 Acquired absence of other right toe(s): Secondary | ICD-10-CM | POA: Diagnosis not present

## 2017-12-09 DIAGNOSIS — Z85820 Personal history of malignant melanoma of skin: Secondary | ICD-10-CM | POA: Diagnosis not present

## 2017-12-09 DIAGNOSIS — Z95810 Presence of automatic (implantable) cardiac defibrillator: Secondary | ICD-10-CM | POA: Diagnosis not present

## 2017-12-09 DIAGNOSIS — L97514 Non-pressure chronic ulcer of other part of right foot with necrosis of bone: Secondary | ICD-10-CM

## 2017-12-09 DIAGNOSIS — Z89419 Acquired absence of unspecified great toe: Secondary | ICD-10-CM

## 2017-12-09 DIAGNOSIS — L97511 Non-pressure chronic ulcer of other part of right foot limited to breakdown of skin: Secondary | ICD-10-CM | POA: Diagnosis not present

## 2017-12-09 DIAGNOSIS — I252 Old myocardial infarction: Secondary | ICD-10-CM | POA: Diagnosis not present

## 2017-12-09 DIAGNOSIS — L89150 Pressure ulcer of sacral region, unstageable: Secondary | ICD-10-CM | POA: Diagnosis not present

## 2017-12-09 DIAGNOSIS — L97512 Non-pressure chronic ulcer of other part of right foot with fat layer exposed: Secondary | ICD-10-CM | POA: Diagnosis not present

## 2017-12-09 DIAGNOSIS — I96 Gangrene, not elsewhere classified: Secondary | ICD-10-CM | POA: Diagnosis not present

## 2017-12-09 DIAGNOSIS — Z7984 Long term (current) use of oral hypoglycemic drugs: Secondary | ICD-10-CM | POA: Diagnosis not present

## 2017-12-09 NOTE — Progress Notes (Signed)
Subjective: Jose Heath is a 67 y.o. male patient seen today in office for POV #3 date of surgery 09/28/2017 at [facility], right great toe amputation secondary to osteomyelitis and chronic ulceration.  Patient admits a little pain and swelling states that he is now home and has been doing a little walking more and states that he did put on a postoperative shoe that could have possibly rubbed the area.  Patient reports that he has home nursing every day that has been dressing the area utilizing a collagen sponge however does not know if the wound is getting better. Denies nausea, vomiting, fever, chills night sweats, shortness of breath or chest pain.  No other issues noted.   Patient reports that he has currently completed all of his antibiotics and was discharged from rehab on 11/28/2017.  Patient Active Problem List   Diagnosis Date Noted  . Arthritis 09/17/2016  . Atrial flutter ([place]) 09/17/2016  . Coronary artery disease 09/17/2016  . Diabetes mellitus ([place]) 09/17/2016  . Disease of thyroid gland 09/17/2016  . HOH (hard of hearing) 09/17/2016  . Hyperlipidemia 09/17/2016  . ICD (implantable cardioverter-defibrillator) in place 09/17/2016  . Left foot pain 09/17/2016  . Skin cancer 09/17/2016  . TSH (thyroid-stimulating hormone deficiency) 08/13/2016  . Anasarca 07/30/2016  . Charcot foot due to diabetes mellitus ([place]) 02/13/2016  . Atypical atrial flutter ([place]) 10/04/2015  . Essential hypertension 10/04/2015  . Ischemic cardiomyopathy 10/04/2015  . Pure hypercholesterolemia 10/04/2015  . Amputated toe of left foot ([place]) 08/15/2015  . Diabetic peripheral neuropathy ([place]) 08/15/2015  . Type 2 diabetes mellitus with Charcot's joint of left foot ([place]) 08/15/2015  . Cardiac defibrillator in place 05/29/2015  . Cardiomyopathy ([place]) 05/29/2015  . Dual ICD (implantable cardioverter-defibrillator) in place 05/29/2015    Current Outpatient Medications on File Prior to Visit   Medication Sig Dispense Refill  . amiodarone (PACERONE) 200 MG tablet Take 200 mg by mouth daily.  1  . amoxicillin-clavulanate (AUGMENTIN) 875-125 MG tablet Take 1 tablet by mouth 2 (two) times daily. 28 tablet 0  . apixaban (ELIQUIS) 5 MG TABS tablet Take 5 mg by mouth.    Marland Kitchen atorvastatin (LIPITOR) 40 MG tablet TAKE 1 TABLET ONCE DAILY.    Marland Kitchen atorvastatin (LIPITOR) 40 MG tablet Take 40 mg by mouth daily.  0  . becaplermin (REGRANEX) 0.01 % gel Apply to affected area 3 times week. 15 g 3  . carvedilol (COREG) 6.25 MG tablet Take 6.25 mg by mouth 2 (two) times daily.  1  . ciprofloxacin (CIPRO) 500 MG tablet Take 1 tablet (500 mg total) by mouth 2 (two) times daily. 28 tablet 0  . Delafloxacin Meglumine (BAXDELA) 450 MG TABS Take 1 tablet by mouth 2 (two) times daily. 20 tablet 0  . doxycycline (VIBRAMYCIN) 100 MG capsule Take 1 capsule (100 mg total) by mouth 2 (two) times daily. 28 capsule 0  . ELIQUIS 2.5 MG TABS tablet TAKE ONE TABLET BY MOUTH TWICE DAILY FOR afib  0  . furosemide (LASIX) 40 MG tablet Take 40 mg by mouth 2 (two) times daily.  2  . glimepiride (AMARYL) 2 MG tablet Take 2 mg by mouth.    Colbert Ewing 1 g injection See admin instructions.  0  . IRON PO Take 325 mg by mouth.    . levofloxacin (LEVAQUIN) 500 MG tablet Take 1 tablet (500 mg total) by mouth daily. 10 tablet 0  . levothyroxine (SYNTHROID, LEVOTHROID) 100 MCG tablet Take by mouth.    Marland Kitchen  levothyroxine (SYNTHROID, LEVOTHROID) 100 MCG tablet Take 100 mcg by mouth every morning.  6  . levothyroxine (SYNTHROID, LEVOTHROID) 88 MCG tablet Take by mouth.    . levothyroxine (SYNTHROID, LEVOTHROID) 88 MCG tablet Take 88 mcg by mouth daily.  0  . Linezolid in Sodium Chloride 600-0.9 MG/300ML-% SOLN     . losartan (COZAAR) 25 MG tablet Take 25 mg by mouth.    . losartan (COZAAR) 50 MG tablet Take 50 mg by mouth.    . metFORMIN (GLUCOPHAGE) 1000 MG tablet Take 1,000 mg by mouth 2 (two) times daily with a meal.  0  . mupirocin  ointment (BACTROBAN) 2 %     . simvastatin (ZOCOR) 20 MG tablet Take by mouth.    . spironolactone (ALDACTONE) 25 MG tablet Take 25 mg by mouth daily.  6  . torsemide (DEMADEX) 20 MG tablet TAKE 1 OR 2 TABLETS BY MOUTH EVERY DAY AS DIRECTED  6  . XYLOCAINE 1 % (with preservative) injection USE 3.70ml with invanz FOR injection  0   No current facility-administered medications on file prior to visit.     Not on File  Objective: There were no vitals filed for this visit.  General: No acute distress, AAOx3  Right foot: There is a full-thickness amputation wound noted at the site of previous hallux amputation with a mix of fiber granular tissue that measures 6 x 5 x 0.5 cm with exposed second metatarsal head and the distal aspect of the wound base with moderate surrounding periwound blanchable erythema, edema, maceration.  There is also a pinpoint ulceration noted at the proximal incision line on the right foot and also at the second toe over the dorsal aspect of the interphalangeal joint with fibrotic wound base that measures less than 5 cm with mild blanchable erythema and edema no other acute signs of infection.  Neurovascular status diminished with difficulty with palpation of DP and PT pedal pulses due to edema.  No pain with calf compression.   Assessment and Plan:  Problem List Items Addressed This Visit    None    Visit Diagnoses    Right foot ulcer, with necrosis of bone ([place])    -  Primary   Relevant Orders   WOUND CULTURE   Cellulitis and abscess of toe of right foot       Relevant Orders   WOUND CULTURE   Right second toe ulcer, limited to breakdown of skin ([place])       Relevant Orders   WOUND CULTURE   Acute osteomyelitis of right ankle or foot ([place])       Relevant Orders   Surgical pathology   S/P amputation of lesser toe, right ([place])       Relevant Orders   WOUND CULTURE   History of amputation of hallux ([place])           -Patient seen and evaluated -Discussed with  patient progression of wound with now exposure of second metatarsal head -After oral consent wound cleanser was applied to the right foot ulceration and a thorough debridement was done with excision and removal of the exposed right second metatarsal head utilizing a bone rongeur.  The specimen was sent for pathology for further evaluation and a wound culture was also sent for further evaluation for any additional need for antibiotics since patient has had a history of sepsis and has been previously on long-term antibiotics.  Following the excision procedure of the second metatarsal head on the right  foot the area was then dressed with plain packing and dry dressing.  The patient tolerated the procedure well without need for any anesthesia hemostasis was achieved using manual pressure.  Patient did not have any complications from this procedure and I advised patient if there is any pain associated with the excision of the metatarsal head to take Tylenol as needed and to continue with rest and elevation.  We will call patient if the culture results come back positive and if there is any need to start any additional antibiotics. -Updated wound care orders sent to home nurse -Advised patient to continue with wheelchair and ambulation only with assistance from physical therapy -Advised patient to limit activity to necessity  -Advised patient to elevate to assist with edema control -Will plan for continued wound care at next office visit. In the meantime, patient to call office if any issues or problems arise.   [name], DPM

## 2017-12-10 ENCOUNTER — Telehealth: Payer: Self-pay | Admitting: *Deleted

## 2017-12-10 DIAGNOSIS — N183 Chronic kidney disease, stage 3 (moderate): Secondary | ICD-10-CM | POA: Diagnosis not present

## 2017-12-10 DIAGNOSIS — I130 Hypertensive heart and chronic kidney disease with heart failure and stage 1 through stage 4 chronic kidney disease, or unspecified chronic kidney disease: Secondary | ICD-10-CM | POA: Diagnosis not present

## 2017-12-10 DIAGNOSIS — I482 Chronic atrial fibrillation: Secondary | ICD-10-CM | POA: Diagnosis not present

## 2017-12-10 DIAGNOSIS — S61412D Laceration without foreign body of left hand, subsequent encounter: Secondary | ICD-10-CM | POA: Diagnosis not present

## 2017-12-10 DIAGNOSIS — S61211D Laceration without foreign body of left index finger without damage to nail, subsequent encounter: Secondary | ICD-10-CM | POA: Diagnosis not present

## 2017-12-10 DIAGNOSIS — E1122 Type 2 diabetes mellitus with diabetic chronic kidney disease: Secondary | ICD-10-CM | POA: Diagnosis not present

## 2017-12-10 DIAGNOSIS — N390 Urinary tract infection, site not specified: Secondary | ICD-10-CM | POA: Diagnosis not present

## 2017-12-10 DIAGNOSIS — L89150 Pressure ulcer of sacral region, unstageable: Secondary | ICD-10-CM | POA: Diagnosis not present

## 2017-12-10 DIAGNOSIS — I5043 Acute on chronic combined systolic (congestive) and diastolic (congestive) heart failure: Secondary | ICD-10-CM | POA: Diagnosis not present

## 2017-12-10 DIAGNOSIS — Z4781 Encounter for orthopedic aftercare following surgical amputation: Secondary | ICD-10-CM | POA: Diagnosis not present

## 2017-12-10 NOTE — Telephone Encounter (Signed)
Jose Heath - [name] states she is at pt's office and is requesting new wound care orders. I read Dr. Leeanne Rio 12/09/2017 1:08pm orders and faxed to [facility].

## 2017-12-10 NOTE — Telephone Encounter (Signed)
-----   Message from [name], [place] sent at 12/09/2017  1:08 PM EDT ----- Regarding: Home nursing orders Today in office I removed the exposed second metatarsal head that was in the wound bed have patient to cleanse the wound with saline or wound wash after cleansing apply packing to the distal aspect of the wound bed and applied a small amount of Prisma or absorbent dressing to the proximal wound bed covered with dry dressing daily.  Patient wound was also cultured and once the results are available we will start patient on antibiotics.  Please carefully observe the area of redness and swelling and if the area of redness advances or cellulitis worsens to call office for further instructions. -Dr. Chauncey Cruel

## 2017-12-10 NOTE — Telephone Encounter (Signed)
Faxed copy of Dr. Leeanne Rio 12/09/2017 1:08pm orders to [facility].

## 2017-12-11 DIAGNOSIS — S61412D Laceration without foreign body of left hand, subsequent encounter: Secondary | ICD-10-CM | POA: Diagnosis not present

## 2017-12-11 DIAGNOSIS — L89150 Pressure ulcer of sacral region, unstageable: Secondary | ICD-10-CM | POA: Diagnosis not present

## 2017-12-11 DIAGNOSIS — I130 Hypertensive heart and chronic kidney disease with heart failure and stage 1 through stage 4 chronic kidney disease, or unspecified chronic kidney disease: Secondary | ICD-10-CM | POA: Diagnosis not present

## 2017-12-11 DIAGNOSIS — S61211D Laceration without foreign body of left index finger without damage to nail, subsequent encounter: Secondary | ICD-10-CM | POA: Diagnosis not present

## 2017-12-11 DIAGNOSIS — N183 Chronic kidney disease, stage 3 (moderate): Secondary | ICD-10-CM | POA: Diagnosis not present

## 2017-12-11 DIAGNOSIS — I482 Chronic atrial fibrillation: Secondary | ICD-10-CM | POA: Diagnosis not present

## 2017-12-11 DIAGNOSIS — Z4781 Encounter for orthopedic aftercare following surgical amputation: Secondary | ICD-10-CM | POA: Diagnosis not present

## 2017-12-11 DIAGNOSIS — I5043 Acute on chronic combined systolic (congestive) and diastolic (congestive) heart failure: Secondary | ICD-10-CM | POA: Diagnosis not present

## 2017-12-11 DIAGNOSIS — E1122 Type 2 diabetes mellitus with diabetic chronic kidney disease: Secondary | ICD-10-CM | POA: Diagnosis not present

## 2017-12-11 DIAGNOSIS — N390 Urinary tract infection, site not specified: Secondary | ICD-10-CM | POA: Diagnosis not present

## 2017-12-12 DIAGNOSIS — N183 Chronic kidney disease, stage 3 (moderate): Secondary | ICD-10-CM | POA: Diagnosis not present

## 2017-12-12 DIAGNOSIS — I482 Chronic atrial fibrillation: Secondary | ICD-10-CM | POA: Diagnosis not present

## 2017-12-12 DIAGNOSIS — S61412D Laceration without foreign body of left hand, subsequent encounter: Secondary | ICD-10-CM | POA: Diagnosis not present

## 2017-12-12 DIAGNOSIS — E1122 Type 2 diabetes mellitus with diabetic chronic kidney disease: Secondary | ICD-10-CM | POA: Diagnosis not present

## 2017-12-12 DIAGNOSIS — S61211D Laceration without foreign body of left index finger without damage to nail, subsequent encounter: Secondary | ICD-10-CM | POA: Diagnosis not present

## 2017-12-12 DIAGNOSIS — Z4781 Encounter for orthopedic aftercare following surgical amputation: Secondary | ICD-10-CM | POA: Diagnosis not present

## 2017-12-12 DIAGNOSIS — N390 Urinary tract infection, site not specified: Secondary | ICD-10-CM | POA: Diagnosis not present

## 2017-12-12 DIAGNOSIS — I130 Hypertensive heart and chronic kidney disease with heart failure and stage 1 through stage 4 chronic kidney disease, or unspecified chronic kidney disease: Secondary | ICD-10-CM | POA: Diagnosis not present

## 2017-12-12 DIAGNOSIS — L89150 Pressure ulcer of sacral region, unstageable: Secondary | ICD-10-CM | POA: Diagnosis not present

## 2017-12-12 DIAGNOSIS — I5043 Acute on chronic combined systolic (congestive) and diastolic (congestive) heart failure: Secondary | ICD-10-CM | POA: Diagnosis not present

## 2017-12-12 LAB — WOUND CULTURE

## 2017-12-13 DIAGNOSIS — I482 Chronic atrial fibrillation: Secondary | ICD-10-CM | POA: Diagnosis not present

## 2017-12-13 DIAGNOSIS — E1122 Type 2 diabetes mellitus with diabetic chronic kidney disease: Secondary | ICD-10-CM | POA: Diagnosis not present

## 2017-12-13 DIAGNOSIS — I5043 Acute on chronic combined systolic (congestive) and diastolic (congestive) heart failure: Secondary | ICD-10-CM | POA: Diagnosis not present

## 2017-12-13 DIAGNOSIS — L89150 Pressure ulcer of sacral region, unstageable: Secondary | ICD-10-CM | POA: Diagnosis not present

## 2017-12-13 DIAGNOSIS — N183 Chronic kidney disease, stage 3 (moderate): Secondary | ICD-10-CM | POA: Diagnosis not present

## 2017-12-13 DIAGNOSIS — S61412D Laceration without foreign body of left hand, subsequent encounter: Secondary | ICD-10-CM | POA: Diagnosis not present

## 2017-12-13 DIAGNOSIS — I130 Hypertensive heart and chronic kidney disease with heart failure and stage 1 through stage 4 chronic kidney disease, or unspecified chronic kidney disease: Secondary | ICD-10-CM | POA: Diagnosis not present

## 2017-12-13 DIAGNOSIS — N390 Urinary tract infection, site not specified: Secondary | ICD-10-CM | POA: Diagnosis not present

## 2017-12-13 DIAGNOSIS — Z4781 Encounter for orthopedic aftercare following surgical amputation: Secondary | ICD-10-CM | POA: Diagnosis not present

## 2017-12-13 DIAGNOSIS — S61211D Laceration without foreign body of left index finger without damage to nail, subsequent encounter: Secondary | ICD-10-CM | POA: Diagnosis not present

## 2017-12-14 DIAGNOSIS — R609 Edema, unspecified: Secondary | ICD-10-CM | POA: Diagnosis not present

## 2017-12-14 DIAGNOSIS — N189 Chronic kidney disease, unspecified: Secondary | ICD-10-CM | POA: Diagnosis not present

## 2017-12-14 DIAGNOSIS — R05 Cough: Secondary | ICD-10-CM | POA: Diagnosis not present

## 2017-12-14 DIAGNOSIS — L89150 Pressure ulcer of sacral region, unstageable: Secondary | ICD-10-CM | POA: Diagnosis not present

## 2017-12-14 DIAGNOSIS — Z4781 Encounter for orthopedic aftercare following surgical amputation: Secondary | ICD-10-CM | POA: Diagnosis not present

## 2017-12-14 DIAGNOSIS — I130 Hypertensive heart and chronic kidney disease with heart failure and stage 1 through stage 4 chronic kidney disease, or unspecified chronic kidney disease: Secondary | ICD-10-CM | POA: Diagnosis not present

## 2017-12-14 DIAGNOSIS — N183 Chronic kidney disease, stage 3 (moderate): Secondary | ICD-10-CM | POA: Diagnosis not present

## 2017-12-14 DIAGNOSIS — I482 Chronic atrial fibrillation: Secondary | ICD-10-CM | POA: Diagnosis not present

## 2017-12-14 DIAGNOSIS — N390 Urinary tract infection, site not specified: Secondary | ICD-10-CM | POA: Diagnosis not present

## 2017-12-14 DIAGNOSIS — D649 Anemia, unspecified: Secondary | ICD-10-CM | POA: Diagnosis not present

## 2017-12-14 DIAGNOSIS — E785 Hyperlipidemia, unspecified: Secondary | ICD-10-CM | POA: Diagnosis not present

## 2017-12-14 DIAGNOSIS — E1165 Type 2 diabetes mellitus with hyperglycemia: Secondary | ICD-10-CM | POA: Diagnosis not present

## 2017-12-14 DIAGNOSIS — E039 Hypothyroidism, unspecified: Secondary | ICD-10-CM | POA: Diagnosis not present

## 2017-12-14 DIAGNOSIS — E1122 Type 2 diabetes mellitus with diabetic chronic kidney disease: Secondary | ICD-10-CM | POA: Diagnosis not present

## 2017-12-14 DIAGNOSIS — R5381 Other malaise: Secondary | ICD-10-CM | POA: Diagnosis not present

## 2017-12-14 DIAGNOSIS — S61211D Laceration without foreign body of left index finger without damage to nail, subsequent encounter: Secondary | ICD-10-CM | POA: Diagnosis not present

## 2017-12-14 DIAGNOSIS — S61412D Laceration without foreign body of left hand, subsequent encounter: Secondary | ICD-10-CM | POA: Diagnosis not present

## 2017-12-14 DIAGNOSIS — I5043 Acute on chronic combined systolic (congestive) and diastolic (congestive) heart failure: Secondary | ICD-10-CM | POA: Diagnosis not present

## 2017-12-14 MED ORDER — SULFAMETHOXAZOLE-TRIMETHOPRIM 800-160 MG PO TABS: 1.0000 | ORAL_TABLET | Freq: Two times a day (BID) | ORAL | 0 refills | Status: DC

## 2017-12-14 NOTE — Telephone Encounter (Signed)
-----   Message from [name], [place] sent at 12/14/2017  9:30 AM EDT ----- Wound culture + for MRSA & Pseudomonas please send bactrim ds take one tab by mouth twice daily to pharmacy and let patient know that we would work this week to order from [facility] a compound antibiotic powder for the nurses to use to the wound. For now they will continue with current wound care until compound powder is received (Val I will have Mel fax the Rx from the [place] office on Wednesday to [facility]) -Dr. Chauncey Cruel

## 2017-12-15 DIAGNOSIS — I482 Chronic atrial fibrillation: Secondary | ICD-10-CM | POA: Diagnosis not present

## 2017-12-15 DIAGNOSIS — L89150 Pressure ulcer of sacral region, unstageable: Secondary | ICD-10-CM | POA: Diagnosis not present

## 2017-12-15 DIAGNOSIS — E1122 Type 2 diabetes mellitus with diabetic chronic kidney disease: Secondary | ICD-10-CM | POA: Diagnosis not present

## 2017-12-15 DIAGNOSIS — N183 Chronic kidney disease, stage 3 (moderate): Secondary | ICD-10-CM | POA: Diagnosis not present

## 2017-12-15 DIAGNOSIS — Z4781 Encounter for orthopedic aftercare following surgical amputation: Secondary | ICD-10-CM | POA: Diagnosis not present

## 2017-12-15 DIAGNOSIS — S61211D Laceration without foreign body of left index finger without damage to nail, subsequent encounter: Secondary | ICD-10-CM | POA: Diagnosis not present

## 2017-12-15 DIAGNOSIS — I130 Hypertensive heart and chronic kidney disease with heart failure and stage 1 through stage 4 chronic kidney disease, or unspecified chronic kidney disease: Secondary | ICD-10-CM | POA: Diagnosis not present

## 2017-12-15 DIAGNOSIS — I5043 Acute on chronic combined systolic (congestive) and diastolic (congestive) heart failure: Secondary | ICD-10-CM | POA: Diagnosis not present

## 2017-12-15 DIAGNOSIS — S61412D Laceration without foreign body of left hand, subsequent encounter: Secondary | ICD-10-CM | POA: Diagnosis not present

## 2017-12-15 DIAGNOSIS — N390 Urinary tract infection, site not specified: Secondary | ICD-10-CM | POA: Diagnosis not present

## 2017-12-16 ENCOUNTER — Ambulatory Visit (INDEPENDENT_AMBULATORY_CARE_PROVIDER_SITE_OTHER): Payer: Medicare HMO | Admitting: Sports Medicine

## 2017-12-16 ENCOUNTER — Encounter: Payer: Self-pay | Admitting: Sports Medicine

## 2017-12-16 VITALS — BP 88/41 | HR 40 | Temp 97.1°F | Resp 16

## 2017-12-16 DIAGNOSIS — L03031 Cellulitis of right toe: Secondary | ICD-10-CM

## 2017-12-16 DIAGNOSIS — M86171 Other acute osteomyelitis, right ankle and foot: Secondary | ICD-10-CM

## 2017-12-16 DIAGNOSIS — Z89419 Acquired absence of unspecified great toe: Secondary | ICD-10-CM

## 2017-12-16 DIAGNOSIS — L97512 Non-pressure chronic ulcer of other part of right foot with fat layer exposed: Secondary | ICD-10-CM

## 2017-12-16 DIAGNOSIS — L97511 Non-pressure chronic ulcer of other part of right foot limited to breakdown of skin: Secondary | ICD-10-CM

## 2017-12-16 DIAGNOSIS — Z89421 Acquired absence of other right toe(s): Secondary | ICD-10-CM

## 2017-12-16 DIAGNOSIS — L02611 Cutaneous abscess of right foot: Secondary | ICD-10-CM

## 2017-12-16 DIAGNOSIS — E1059 Type 1 diabetes mellitus with other circulatory complications: Secondary | ICD-10-CM | POA: Diagnosis not present

## 2017-12-16 DIAGNOSIS — L89150 Pressure ulcer of sacral region, unstageable: Secondary | ICD-10-CM | POA: Diagnosis not present

## 2017-12-16 NOTE — Progress Notes (Signed)
Subjective: Jose Heath is a 67 y.o. male patient seen today in office for POV #4 date of surgery 09/28/2017 at [facility], right great toe amputation secondary to osteomyelitis and chronic ulceration.  Patient admits a little pain and swelling ranks pain 4 out of 10 that is aching in nature.  Patient is also here for discussion and review of wound culture results and reports that on Monday his last blood sugar was 140 and that he saw his primary care doctor last on Monday.  Patient reports that he picked up his Bactrim antibiotic and is taking it with no problems or issues denies nausea, vomiting, fever, chills night sweats or any other increased episodes of pain.  Patient is assisted by sister this visit.  Patient Active Problem List   Diagnosis Date Noted  . Arthritis 09/17/2016  . Atrial flutter ([place]) 09/17/2016  . Coronary artery disease 09/17/2016  . Diabetes mellitus ([place]) 09/17/2016  . Disease of thyroid gland 09/17/2016  . HOH (hard of hearing) 09/17/2016  . Hyperlipidemia 09/17/2016  . ICD (implantable cardioverter-defibrillator) in place 09/17/2016  . Left foot pain 09/17/2016  . Skin cancer 09/17/2016  . TSH (thyroid-stimulating hormone deficiency) 08/13/2016  . Anasarca 07/30/2016  . Charcot foot due to diabetes mellitus ([place]) 02/13/2016  . Atypical atrial flutter ([place]) 10/04/2015  . Essential hypertension 10/04/2015  . Ischemic cardiomyopathy 10/04/2015  . Pure hypercholesterolemia 10/04/2015  . Amputated toe of left foot ([place]) 08/15/2015  . Diabetic peripheral neuropathy ([place]) 08/15/2015  . Type 2 diabetes mellitus with Charcot's joint of left foot ([place]) 08/15/2015  . Cardiac defibrillator in place 05/29/2015  . Cardiomyopathy ([place]) 05/29/2015  . Dual ICD (implantable cardioverter-defibrillator) in place 05/29/2015    Current Outpatient Medications on File Prior to Visit  Medication Sig Dispense Refill  . amiodarone (PACERONE) 200 MG tablet Take 200 mg by  mouth daily.  1  . amoxicillin-clavulanate (AUGMENTIN) 875-125 MG tablet Take 1 tablet by mouth 2 (two) times daily. 28 tablet 0  . apixaban (ELIQUIS) 5 MG TABS tablet Take 5 mg by mouth.    Marland Kitchen atorvastatin (LIPITOR) 40 MG tablet TAKE 1 TABLET ONCE DAILY.    Marland Kitchen atorvastatin (LIPITOR) 40 MG tablet Take 40 mg by mouth daily.  0  . becaplermin (REGRANEX) 0.01 % gel Apply to affected area 3 times week. 15 g 3  . carvedilol (COREG) 6.25 MG tablet Take 6.25 mg by mouth 2 (two) times daily.  1  . ciprofloxacin (CIPRO) 500 MG tablet Take 1 tablet (500 mg total) by mouth 2 (two) times daily. 28 tablet 0  . Delafloxacin Meglumine (BAXDELA) 450 MG TABS Take 1 tablet by mouth 2 (two) times daily. 20 tablet 0  . doxycycline (VIBRAMYCIN) 100 MG capsule Take 1 capsule (100 mg total) by mouth 2 (two) times daily. 28 capsule 0  . ELIQUIS 2.5 MG TABS tablet TAKE ONE TABLET BY MOUTH TWICE DAILY FOR afib  0  . furosemide (LASIX) 40 MG tablet Take 40 mg by mouth 2 (two) times daily.  2  . glimepiride (AMARYL) 2 MG tablet Take 2 mg by mouth.    Colbert Ewing 1 g injection See admin instructions.  0  . IRON PO Take 325 mg by mouth.    . levofloxacin (LEVAQUIN) 500 MG tablet Take 1 tablet (500 mg total) by mouth daily. 10 tablet 0  . levothyroxine (SYNTHROID, LEVOTHROID) 100 MCG tablet Take by mouth.    . levothyroxine (SYNTHROID, LEVOTHROID) 100 MCG tablet Take 100 mcg  by mouth every morning.  6  . levothyroxine (SYNTHROID, LEVOTHROID) 88 MCG tablet Take by mouth.    . levothyroxine (SYNTHROID, LEVOTHROID) 88 MCG tablet Take 88 mcg by mouth daily.  0  . Linezolid in Sodium Chloride 600-0.9 MG/300ML-% SOLN     . losartan (COZAAR) 25 MG tablet Take 25 mg by mouth.    . losartan (COZAAR) 50 MG tablet Take 50 mg by mouth.    . metFORMIN (GLUCOPHAGE) 1000 MG tablet Take 1,000 mg by mouth 2 (two) times daily with a meal.  0  . mupirocin ointment (BACTROBAN) 2 %     . simvastatin (ZOCOR) 20 MG tablet Take by mouth.    .  spironolactone (ALDACTONE) 25 MG tablet Take 25 mg by mouth daily.  6  . sulfamethoxazole-trimethoprim (BACTRIM DS,SEPTRA DS) 800-160 MG tablet Take 1 tablet by mouth 2 (two) times daily. 28 tablet 0  . torsemide (DEMADEX) 20 MG tablet TAKE 1 OR 2 TABLETS BY MOUTH EVERY DAY AS DIRECTED  6  . XYLOCAINE 1 % (with preservative) injection USE 3.70ml with invanz FOR injection  0   No current facility-administered medications on file prior to visit.     Not on File  Objective: There were no vitals filed for this visit.  General: No acute distress, AAOx3  Right foot: There is a full-thickness amputation wound noted at the site of previous hallux amputation with a mix of fiber granular tissue that measures 5.9 x 4.5 x 0.5 cm with no longer exposed bone since this bone was resected at last visit with decreased surrounding periwound blanchable erythema, edema, maceration.  There is also a pinpoint ulceration noted at the proximal incision line on the right foot and also at the second toe over the dorsal aspect of the interphalangeal joint with fibrotic wound base that measures less than 5 cm with mild blanchable erythema and edema no other acute signs of infection.  Neurovascular status diminished with difficulty with palpation of DP and PT pedal pulses due to edema.  No pain with calf compression.   Assessment and Plan:  Problem List Items Addressed This Visit    None    Visit Diagnoses    Right foot ulcer, with fat layer exposed ([place])    -  Primary   Cellulitis and abscess of toe of right foot       Right second toe ulcer, limited to breakdown of skin ([place])       Acute osteomyelitis of right ankle or foot ([place])       S/P amputation of lesser toe, right ([place])       History of amputation of hallux ([place])          -Patient seen and evaluated -Discussed with patient progression of wound at open amputation site and right second toe -Wound culture results reviewed patient to continue with  Bactrim -Cleanse ulcerations with saline and then applied Betadine wet-to-dry to help with maceration control and ordered from [facility] a custom antibiotic powder to use to the right foot ulcerations; nurse to continue current plan of wound care until  this compound powder is received on a daily basis -Dispensed a new postop shoe that is better fitting for the right foot -Advised patient to continue with walker or wheelchair -Advised patient to limit activity to necessity  -Advised patient to elevate to assist with edema control -Will plan for continued wound care at next office visit. In the meantime, patient to call office if any  issues or problems arise.   [name], DPM

## 2017-12-17 DIAGNOSIS — N390 Urinary tract infection, site not specified: Secondary | ICD-10-CM | POA: Diagnosis not present

## 2017-12-17 DIAGNOSIS — I130 Hypertensive heart and chronic kidney disease with heart failure and stage 1 through stage 4 chronic kidney disease, or unspecified chronic kidney disease: Secondary | ICD-10-CM | POA: Diagnosis not present

## 2017-12-17 DIAGNOSIS — I482 Chronic atrial fibrillation: Secondary | ICD-10-CM | POA: Diagnosis not present

## 2017-12-17 DIAGNOSIS — E1122 Type 2 diabetes mellitus with diabetic chronic kidney disease: Secondary | ICD-10-CM | POA: Diagnosis not present

## 2017-12-17 DIAGNOSIS — S61412D Laceration without foreign body of left hand, subsequent encounter: Secondary | ICD-10-CM | POA: Diagnosis not present

## 2017-12-17 DIAGNOSIS — L89150 Pressure ulcer of sacral region, unstageable: Secondary | ICD-10-CM | POA: Diagnosis not present

## 2017-12-17 DIAGNOSIS — N183 Chronic kidney disease, stage 3 (moderate): Secondary | ICD-10-CM | POA: Diagnosis not present

## 2017-12-17 DIAGNOSIS — I5043 Acute on chronic combined systolic (congestive) and diastolic (congestive) heart failure: Secondary | ICD-10-CM | POA: Diagnosis not present

## 2017-12-17 DIAGNOSIS — S61211D Laceration without foreign body of left index finger without damage to nail, subsequent encounter: Secondary | ICD-10-CM | POA: Diagnosis not present

## 2017-12-17 DIAGNOSIS — Z4781 Encounter for orthopedic aftercare following surgical amputation: Secondary | ICD-10-CM | POA: Diagnosis not present

## 2017-12-18 DIAGNOSIS — E1122 Type 2 diabetes mellitus with diabetic chronic kidney disease: Secondary | ICD-10-CM | POA: Diagnosis not present

## 2017-12-18 DIAGNOSIS — I482 Chronic atrial fibrillation: Secondary | ICD-10-CM | POA: Diagnosis not present

## 2017-12-18 DIAGNOSIS — N183 Chronic kidney disease, stage 3 (moderate): Secondary | ICD-10-CM | POA: Diagnosis not present

## 2017-12-18 DIAGNOSIS — S61211D Laceration without foreign body of left index finger without damage to nail, subsequent encounter: Secondary | ICD-10-CM | POA: Diagnosis not present

## 2017-12-18 DIAGNOSIS — Z4781 Encounter for orthopedic aftercare following surgical amputation: Secondary | ICD-10-CM | POA: Diagnosis not present

## 2017-12-18 DIAGNOSIS — N390 Urinary tract infection, site not specified: Secondary | ICD-10-CM | POA: Diagnosis not present

## 2017-12-18 DIAGNOSIS — L89150 Pressure ulcer of sacral region, unstageable: Secondary | ICD-10-CM | POA: Diagnosis not present

## 2017-12-18 DIAGNOSIS — I5043 Acute on chronic combined systolic (congestive) and diastolic (congestive) heart failure: Secondary | ICD-10-CM | POA: Diagnosis not present

## 2017-12-18 DIAGNOSIS — I130 Hypertensive heart and chronic kidney disease with heart failure and stage 1 through stage 4 chronic kidney disease, or unspecified chronic kidney disease: Secondary | ICD-10-CM | POA: Diagnosis not present

## 2017-12-18 DIAGNOSIS — S61412D Laceration without foreign body of left hand, subsequent encounter: Secondary | ICD-10-CM | POA: Diagnosis not present

## 2017-12-19 DIAGNOSIS — S61412D Laceration without foreign body of left hand, subsequent encounter: Secondary | ICD-10-CM | POA: Diagnosis not present

## 2017-12-19 DIAGNOSIS — N390 Urinary tract infection, site not specified: Secondary | ICD-10-CM | POA: Diagnosis not present

## 2017-12-19 DIAGNOSIS — N183 Chronic kidney disease, stage 3 (moderate): Secondary | ICD-10-CM | POA: Diagnosis not present

## 2017-12-19 DIAGNOSIS — I482 Chronic atrial fibrillation: Secondary | ICD-10-CM | POA: Diagnosis not present

## 2017-12-19 DIAGNOSIS — E1122 Type 2 diabetes mellitus with diabetic chronic kidney disease: Secondary | ICD-10-CM | POA: Diagnosis not present

## 2017-12-19 DIAGNOSIS — I5043 Acute on chronic combined systolic (congestive) and diastolic (congestive) heart failure: Secondary | ICD-10-CM | POA: Diagnosis not present

## 2017-12-19 DIAGNOSIS — L89150 Pressure ulcer of sacral region, unstageable: Secondary | ICD-10-CM | POA: Diagnosis not present

## 2017-12-19 DIAGNOSIS — Z4781 Encounter for orthopedic aftercare following surgical amputation: Secondary | ICD-10-CM | POA: Diagnosis not present

## 2017-12-19 DIAGNOSIS — S61211D Laceration without foreign body of left index finger without damage to nail, subsequent encounter: Secondary | ICD-10-CM | POA: Diagnosis not present

## 2017-12-19 DIAGNOSIS — I130 Hypertensive heart and chronic kidney disease with heart failure and stage 1 through stage 4 chronic kidney disease, or unspecified chronic kidney disease: Secondary | ICD-10-CM | POA: Diagnosis not present

## 2017-12-20 DIAGNOSIS — I130 Hypertensive heart and chronic kidney disease with heart failure and stage 1 through stage 4 chronic kidney disease, or unspecified chronic kidney disease: Secondary | ICD-10-CM | POA: Diagnosis not present

## 2017-12-20 DIAGNOSIS — S61412D Laceration without foreign body of left hand, subsequent encounter: Secondary | ICD-10-CM | POA: Diagnosis not present

## 2017-12-20 DIAGNOSIS — N390 Urinary tract infection, site not specified: Secondary | ICD-10-CM | POA: Diagnosis not present

## 2017-12-20 DIAGNOSIS — I5043 Acute on chronic combined systolic (congestive) and diastolic (congestive) heart failure: Secondary | ICD-10-CM | POA: Diagnosis not present

## 2017-12-20 DIAGNOSIS — Z4781 Encounter for orthopedic aftercare following surgical amputation: Secondary | ICD-10-CM | POA: Diagnosis not present

## 2017-12-20 DIAGNOSIS — S61211D Laceration without foreign body of left index finger without damage to nail, subsequent encounter: Secondary | ICD-10-CM | POA: Diagnosis not present

## 2017-12-20 DIAGNOSIS — L89150 Pressure ulcer of sacral region, unstageable: Secondary | ICD-10-CM | POA: Diagnosis not present

## 2017-12-20 DIAGNOSIS — N183 Chronic kidney disease, stage 3 (moderate): Secondary | ICD-10-CM | POA: Diagnosis not present

## 2017-12-20 DIAGNOSIS — E1122 Type 2 diabetes mellitus with diabetic chronic kidney disease: Secondary | ICD-10-CM | POA: Diagnosis not present

## 2017-12-20 DIAGNOSIS — I482 Chronic atrial fibrillation: Secondary | ICD-10-CM | POA: Diagnosis not present

## 2017-12-21 DIAGNOSIS — S61412D Laceration without foreign body of left hand, subsequent encounter: Secondary | ICD-10-CM | POA: Diagnosis not present

## 2017-12-21 DIAGNOSIS — L89150 Pressure ulcer of sacral region, unstageable: Secondary | ICD-10-CM | POA: Diagnosis not present

## 2017-12-21 DIAGNOSIS — I130 Hypertensive heart and chronic kidney disease with heart failure and stage 1 through stage 4 chronic kidney disease, or unspecified chronic kidney disease: Secondary | ICD-10-CM | POA: Diagnosis not present

## 2017-12-21 DIAGNOSIS — N390 Urinary tract infection, site not specified: Secondary | ICD-10-CM | POA: Diagnosis not present

## 2017-12-21 DIAGNOSIS — Z4781 Encounter for orthopedic aftercare following surgical amputation: Secondary | ICD-10-CM | POA: Diagnosis not present

## 2017-12-21 DIAGNOSIS — I482 Chronic atrial fibrillation: Secondary | ICD-10-CM | POA: Diagnosis not present

## 2017-12-21 DIAGNOSIS — N183 Chronic kidney disease, stage 3 (moderate): Secondary | ICD-10-CM | POA: Diagnosis not present

## 2017-12-21 DIAGNOSIS — S61211D Laceration without foreign body of left index finger without damage to nail, subsequent encounter: Secondary | ICD-10-CM | POA: Diagnosis not present

## 2017-12-21 DIAGNOSIS — E1122 Type 2 diabetes mellitus with diabetic chronic kidney disease: Secondary | ICD-10-CM | POA: Diagnosis not present

## 2017-12-21 DIAGNOSIS — I5043 Acute on chronic combined systolic (congestive) and diastolic (congestive) heart failure: Secondary | ICD-10-CM | POA: Diagnosis not present

## 2017-12-22 DIAGNOSIS — S61412D Laceration without foreign body of left hand, subsequent encounter: Secondary | ICD-10-CM | POA: Diagnosis not present

## 2017-12-22 DIAGNOSIS — N390 Urinary tract infection, site not specified: Secondary | ICD-10-CM | POA: Diagnosis not present

## 2017-12-22 DIAGNOSIS — I5043 Acute on chronic combined systolic (congestive) and diastolic (congestive) heart failure: Secondary | ICD-10-CM | POA: Diagnosis not present

## 2017-12-22 DIAGNOSIS — Z4781 Encounter for orthopedic aftercare following surgical amputation: Secondary | ICD-10-CM | POA: Diagnosis not present

## 2017-12-22 DIAGNOSIS — S61211D Laceration without foreign body of left index finger without damage to nail, subsequent encounter: Secondary | ICD-10-CM | POA: Diagnosis not present

## 2017-12-22 DIAGNOSIS — I482 Chronic atrial fibrillation: Secondary | ICD-10-CM | POA: Diagnosis not present

## 2017-12-22 DIAGNOSIS — L89150 Pressure ulcer of sacral region, unstageable: Secondary | ICD-10-CM | POA: Diagnosis not present

## 2017-12-22 DIAGNOSIS — E1122 Type 2 diabetes mellitus with diabetic chronic kidney disease: Secondary | ICD-10-CM | POA: Diagnosis not present

## 2017-12-22 DIAGNOSIS — N183 Chronic kidney disease, stage 3 (moderate): Secondary | ICD-10-CM | POA: Diagnosis not present

## 2017-12-22 DIAGNOSIS — I130 Hypertensive heart and chronic kidney disease with heart failure and stage 1 through stage 4 chronic kidney disease, or unspecified chronic kidney disease: Secondary | ICD-10-CM | POA: Diagnosis not present

## 2017-12-23 DIAGNOSIS — S61211D Laceration without foreign body of left index finger without damage to nail, subsequent encounter: Secondary | ICD-10-CM | POA: Diagnosis not present

## 2017-12-23 DIAGNOSIS — R339 Retention of urine, unspecified: Secondary | ICD-10-CM | POA: Diagnosis not present

## 2017-12-23 DIAGNOSIS — S61412D Laceration without foreign body of left hand, subsequent encounter: Secondary | ICD-10-CM | POA: Diagnosis not present

## 2017-12-23 DIAGNOSIS — I482 Chronic atrial fibrillation: Secondary | ICD-10-CM | POA: Diagnosis not present

## 2017-12-23 DIAGNOSIS — L89150 Pressure ulcer of sacral region, unstageable: Secondary | ICD-10-CM | POA: Diagnosis not present

## 2017-12-23 DIAGNOSIS — N401 Enlarged prostate with lower urinary tract symptoms: Secondary | ICD-10-CM | POA: Diagnosis not present

## 2017-12-23 DIAGNOSIS — I130 Hypertensive heart and chronic kidney disease with heart failure and stage 1 through stage 4 chronic kidney disease, or unspecified chronic kidney disease: Secondary | ICD-10-CM | POA: Diagnosis not present

## 2017-12-23 DIAGNOSIS — E1122 Type 2 diabetes mellitus with diabetic chronic kidney disease: Secondary | ICD-10-CM | POA: Diagnosis not present

## 2017-12-23 DIAGNOSIS — A499 Bacterial infection, unspecified: Secondary | ICD-10-CM | POA: Diagnosis not present

## 2017-12-23 DIAGNOSIS — N471 Phimosis: Secondary | ICD-10-CM | POA: Diagnosis not present

## 2017-12-23 DIAGNOSIS — I5043 Acute on chronic combined systolic (congestive) and diastolic (congestive) heart failure: Secondary | ICD-10-CM | POA: Diagnosis not present

## 2017-12-23 DIAGNOSIS — N390 Urinary tract infection, site not specified: Secondary | ICD-10-CM | POA: Diagnosis not present

## 2017-12-23 DIAGNOSIS — Z4781 Encounter for orthopedic aftercare following surgical amputation: Secondary | ICD-10-CM | POA: Diagnosis not present

## 2017-12-23 DIAGNOSIS — N183 Chronic kidney disease, stage 3 (moderate): Secondary | ICD-10-CM | POA: Diagnosis not present

## 2017-12-24 DIAGNOSIS — N183 Chronic kidney disease, stage 3 (moderate): Secondary | ICD-10-CM | POA: Diagnosis not present

## 2017-12-24 DIAGNOSIS — I482 Chronic atrial fibrillation: Secondary | ICD-10-CM | POA: Diagnosis not present

## 2017-12-24 DIAGNOSIS — E1122 Type 2 diabetes mellitus with diabetic chronic kidney disease: Secondary | ICD-10-CM | POA: Diagnosis not present

## 2017-12-24 DIAGNOSIS — Z4781 Encounter for orthopedic aftercare following surgical amputation: Secondary | ICD-10-CM | POA: Diagnosis not present

## 2017-12-24 DIAGNOSIS — I5043 Acute on chronic combined systolic (congestive) and diastolic (congestive) heart failure: Secondary | ICD-10-CM | POA: Diagnosis not present

## 2017-12-24 DIAGNOSIS — S61412D Laceration without foreign body of left hand, subsequent encounter: Secondary | ICD-10-CM | POA: Diagnosis not present

## 2017-12-24 DIAGNOSIS — S61211D Laceration without foreign body of left index finger without damage to nail, subsequent encounter: Secondary | ICD-10-CM | POA: Diagnosis not present

## 2017-12-24 DIAGNOSIS — I130 Hypertensive heart and chronic kidney disease with heart failure and stage 1 through stage 4 chronic kidney disease, or unspecified chronic kidney disease: Secondary | ICD-10-CM | POA: Diagnosis not present

## 2017-12-24 DIAGNOSIS — N390 Urinary tract infection, site not specified: Secondary | ICD-10-CM | POA: Diagnosis not present

## 2017-12-24 DIAGNOSIS — L89150 Pressure ulcer of sacral region, unstageable: Secondary | ICD-10-CM | POA: Diagnosis not present

## 2017-12-25 ENCOUNTER — Ambulatory Visit (INDEPENDENT_AMBULATORY_CARE_PROVIDER_SITE_OTHER): Payer: Medicare HMO | Admitting: Sports Medicine

## 2017-12-25 ENCOUNTER — Encounter: Payer: Self-pay | Admitting: Sports Medicine

## 2017-12-25 VITALS — BP 140/70 | HR 60 | Temp 97.6°F | Resp 16

## 2017-12-25 DIAGNOSIS — L97511 Non-pressure chronic ulcer of other part of right foot limited to breakdown of skin: Secondary | ICD-10-CM

## 2017-12-25 DIAGNOSIS — L89150 Pressure ulcer of sacral region, unstageable: Secondary | ICD-10-CM | POA: Diagnosis not present

## 2017-12-25 DIAGNOSIS — E1059 Type 1 diabetes mellitus with other circulatory complications: Secondary | ICD-10-CM | POA: Diagnosis not present

## 2017-12-25 DIAGNOSIS — Z89419 Acquired absence of unspecified great toe: Secondary | ICD-10-CM

## 2017-12-25 DIAGNOSIS — M86171 Other acute osteomyelitis, right ankle and foot: Secondary | ICD-10-CM

## 2017-12-25 DIAGNOSIS — E10628 Type 1 diabetes mellitus with other skin complications: Secondary | ICD-10-CM | POA: Diagnosis not present

## 2017-12-25 DIAGNOSIS — L97514 Non-pressure chronic ulcer of other part of right foot with necrosis of bone: Secondary | ICD-10-CM

## 2017-12-25 DIAGNOSIS — Z89421 Acquired absence of other right toe(s): Secondary | ICD-10-CM

## 2017-12-25 NOTE — Progress Notes (Signed)
Subjective: Jose Heath is a 67 y.o. male patient seen today in office for POV #5 date of surgery 09/28/2017 at [facility], right great toe amputation secondary to osteomyelitis and chronic ulceration.  Patient admits a little pain and swelling ranks pain 3 out of 10 that is aching in nature.patient reports that it has been bleeding a lot since yesterday and that is family wrapped it with extra golf however it continued to bleed through states that he has been using a walker and is taking his antibiotic by mouth but is still waiting on the custom antibiotic powder to be mailed to his home.  Reports that he went to the wound care center for his sacral wound and had fell down in the waiting room.  Patient did hit his head but refuses to go to the emergency room to be treated.  Patient did not check his blood sugars today symptoms at this time.   Patient is assisted by sister this visit.  Patient Active Problem List   Diagnosis Date Noted  . Arthritis 09/17/2016  . Atrial flutter ([place]) 09/17/2016  . Coronary artery disease 09/17/2016  . Diabetes mellitus ([place]) 09/17/2016  . Disease of thyroid gland 09/17/2016  . HOH (hard of hearing) 09/17/2016  . Hyperlipidemia 09/17/2016  . ICD (implantable cardioverter-defibrillator) in place 09/17/2016  . Left foot pain 09/17/2016  . Skin cancer 09/17/2016  . TSH (thyroid-stimulating hormone deficiency) 08/13/2016  . Anasarca 07/30/2016  . Charcot foot due to diabetes mellitus ([place]) 02/13/2016  . Atypical atrial flutter ([place]) 10/04/2015  . Essential hypertension 10/04/2015  . Ischemic cardiomyopathy 10/04/2015  . Pure hypercholesterolemia 10/04/2015  . Amputated toe of left foot ([place]) 08/15/2015  . Diabetic peripheral neuropathy ([place]) 08/15/2015  . Type 2 diabetes mellitus with Charcot's joint of left foot ([place]) 08/15/2015  . Cardiac defibrillator in place 05/29/2015  . Cardiomyopathy ([place]) 05/29/2015  . Dual ICD (implantable  cardioverter-defibrillator) in place 05/29/2015    Current Outpatient Medications on File Prior to Visit  Medication Sig Dispense Refill  . amiodarone (PACERONE) 200 MG tablet Take 200 mg by mouth daily.  1  . amoxicillin-clavulanate (AUGMENTIN) 875-125 MG tablet Take 1 tablet by mouth 2 (two) times daily. 28 tablet 0  . apixaban (ELIQUIS) 5 MG TABS tablet Take 5 mg by mouth.    Marland Kitchen atorvastatin (LIPITOR) 40 MG tablet TAKE 1 TABLET ONCE DAILY.    Marland Kitchen atorvastatin (LIPITOR) 40 MG tablet Take 40 mg by mouth daily.  0  . becaplermin (REGRANEX) 0.01 % gel Apply to affected area 3 times week. 15 g 3  . carvedilol (COREG) 6.25 MG tablet Take 6.25 mg by mouth 2 (two) times daily.  1  . ciprofloxacin (CIPRO) 500 MG tablet Take 1 tablet (500 mg total) by mouth 2 (two) times daily. 28 tablet 0  . Delafloxacin Meglumine (BAXDELA) 450 MG TABS Take 1 tablet by mouth 2 (two) times daily. 20 tablet 0  . doxycycline (VIBRAMYCIN) 100 MG capsule Take 1 capsule (100 mg total) by mouth 2 (two) times daily. 28 capsule 0  . ELIQUIS 2.5 MG TABS tablet TAKE ONE TABLET BY MOUTH TWICE DAILY FOR afib  0  . furosemide (LASIX) 40 MG tablet Take 40 mg by mouth 2 (two) times daily.  2  . glimepiride (AMARYL) 2 MG tablet Take 2 mg by mouth.    Colbert Ewing 1 g injection See admin instructions.  0  . IRON PO Take 325 mg by mouth.    . levofloxacin (  LEVAQUIN) 500 MG tablet Take 1 tablet (500 mg total) by mouth daily. 10 tablet 0  . levothyroxine (SYNTHROID, LEVOTHROID) 100 MCG tablet Take by mouth.    . levothyroxine (SYNTHROID, LEVOTHROID) 100 MCG tablet Take 100 mcg by mouth every morning.  6  . levothyroxine (SYNTHROID, LEVOTHROID) 88 MCG tablet Take by mouth.    . levothyroxine (SYNTHROID, LEVOTHROID) 88 MCG tablet Take 88 mcg by mouth daily.  0  . Linezolid in Sodium Chloride 600-0.9 MG/300ML-% SOLN     . losartan (COZAAR) 25 MG tablet Take 25 mg by mouth.    . losartan (COZAAR) 50 MG tablet Take 50 mg by mouth.    .  metFORMIN (GLUCOPHAGE) 1000 MG tablet Take 1,000 mg by mouth 2 (two) times daily with a meal.  0  . mupirocin ointment (BACTROBAN) 2 %     . simvastatin (ZOCOR) 20 MG tablet Take by mouth.    . spironolactone (ALDACTONE) 25 MG tablet Take 25 mg by mouth daily.  6  . sulfamethoxazole-trimethoprim (BACTRIM DS,SEPTRA DS) 800-160 MG tablet Take 1 tablet by mouth 2 (two) times daily. 28 tablet 0  . torsemide (DEMADEX) 20 MG tablet TAKE 1 OR 2 TABLETS BY MOUTH EVERY DAY AS DIRECTED  6  . XYLOCAINE 1 % (with preservative) injection USE 3.47ml with invanz FOR injection  0   No current facility-administered medications on file prior to visit.     Not on File  Objective: There were no vitals filed for this visit.  General: No acute distress, AAOx3  Right foot: There is a full-thickness amputation wound noted at the site of previous hallux amputation with a mix of fiber granular tissue that measures 6 x 4.5 x 0.5 cm with hyper granular tissue appearing like a hematoma at the distal aspect of the wound bed once cauterized and debrided more second metatarsal shaft was revealed.  No malodor, mild periwound blanchable erythema, edema, maceration.  There is also a pinpoint ulceration noted at the proximal incision line on the right foot and also at the second toe over the dorsal aspect of the interphalangeal joint with fibrotic wound base that measures less than 0.5 cm with mild blanchable erythema and edema no other acute signs of infection.  Neurovascular status diminished with difficulty with palpation of DP and PT pedal pulses due to edema.  No pain with calf compression.   Assessment and Plan:  Problem List Items Addressed This Visit    None    Visit Diagnoses    Right foot ulcer, with necrosis of bone ([place])    -  Primary   Right second toe ulcer, limited to breakdown of skin ([place])       Acute osteomyelitis of right ankle or foot ([place])       S/P amputation of lesser toe, right ([place])       History of  amputation of hallux ([place])          -Patient seen and evaluated -Discussed with patient progression of wound at open amputation site and right second toe -Cleanse ulcerations with saline and then mechanically debrided nonviable tissue at the open amputation site wound removing all nonviable tissues down to the level of exposed bone and cauterizing bleeders with silver nitrate and then applied Surgicel hemostatic pad to the area.  Patient tolerated the procedure debridement well with wound measurements as above. -Patient is awaiting from [facility] a custom antibiotic powder to use to the right foot ulcerations; nurse to continue current  plan of wound care until  this compound powder is received on a daily basis -Tighten the straps on the postoperative shoe and advised patient to keep the straps tightened to prevent tripping and falling -Advised patient to continue with walker or wheelchair -Advised patient to limit activity to necessity  -Advised patient to elevate to assist with edema control -Will plan for continued wound care and removal of bone at next office visit. In the meantime, patient to call office if any issues or problems arise.   The nurse from [facility] wound care center called my office to inform me that patient did fall at the wound care center this morning-and reports that he is having difficulty walking with postop shoe and states that when he did fall he did bump his head and they offered him to go to the hospital for x-rays and patient refused patient refused to be treated for skin tear and also hit his head and left the wound clinic to come to my appointment.  [name], DPM

## 2017-12-26 ENCOUNTER — Telehealth: Payer: Self-pay | Admitting: Podiatry

## 2017-12-26 DIAGNOSIS — S61211D Laceration without foreign body of left index finger without damage to nail, subsequent encounter: Secondary | ICD-10-CM | POA: Diagnosis not present

## 2017-12-26 DIAGNOSIS — E1122 Type 2 diabetes mellitus with diabetic chronic kidney disease: Secondary | ICD-10-CM | POA: Diagnosis not present

## 2017-12-26 DIAGNOSIS — Z4781 Encounter for orthopedic aftercare following surgical amputation: Secondary | ICD-10-CM | POA: Diagnosis not present

## 2017-12-26 DIAGNOSIS — N390 Urinary tract infection, site not specified: Secondary | ICD-10-CM | POA: Diagnosis not present

## 2017-12-26 DIAGNOSIS — I130 Hypertensive heart and chronic kidney disease with heart failure and stage 1 through stage 4 chronic kidney disease, or unspecified chronic kidney disease: Secondary | ICD-10-CM | POA: Diagnosis not present

## 2017-12-26 DIAGNOSIS — R339 Retention of urine, unspecified: Secondary | ICD-10-CM | POA: Diagnosis not present

## 2017-12-26 DIAGNOSIS — I5043 Acute on chronic combined systolic (congestive) and diastolic (congestive) heart failure: Secondary | ICD-10-CM | POA: Diagnosis not present

## 2017-12-26 DIAGNOSIS — N401 Enlarged prostate with lower urinary tract symptoms: Secondary | ICD-10-CM | POA: Diagnosis not present

## 2017-12-26 DIAGNOSIS — S61412D Laceration without foreign body of left hand, subsequent encounter: Secondary | ICD-10-CM | POA: Diagnosis not present

## 2017-12-26 DIAGNOSIS — L89150 Pressure ulcer of sacral region, unstageable: Secondary | ICD-10-CM | POA: Diagnosis not present

## 2017-12-26 DIAGNOSIS — I482 Chronic atrial fibrillation: Secondary | ICD-10-CM | POA: Diagnosis not present

## 2017-12-26 DIAGNOSIS — N183 Chronic kidney disease, stage 3 (moderate): Secondary | ICD-10-CM | POA: Diagnosis not present

## 2017-12-26 NOTE — Telephone Encounter (Signed)
Acall was received but my phone did not initially record ot.  Finally about 3:15 I returned the call and talked to [name] who works for [employer].  She needs clarification  for application of custom  antibiotic powder.  She was told to apply the powder to the granulation tissue.  This site should be bandaged with sterile gauze pad, kerlix and/ or ABD pad and finally an ace wrap.  I told her to discontinue the prism application until seen by Dr.  Cannon Kettle.   I also gave permission for allow  gentamicn IM for his UTI .   This patient has history of DM, amputation of digits and is taking eliquiss.   [name] was told to call back as needed.  This patient should follow up with Dr. Cannon Kettle.

## 2017-12-27 DIAGNOSIS — R339 Retention of urine, unspecified: Secondary | ICD-10-CM | POA: Diagnosis not present

## 2017-12-27 DIAGNOSIS — I130 Hypertensive heart and chronic kidney disease with heart failure and stage 1 through stage 4 chronic kidney disease, or unspecified chronic kidney disease: Secondary | ICD-10-CM | POA: Diagnosis not present

## 2017-12-27 DIAGNOSIS — N401 Enlarged prostate with lower urinary tract symptoms: Secondary | ICD-10-CM | POA: Diagnosis not present

## 2017-12-27 DIAGNOSIS — Z4781 Encounter for orthopedic aftercare following surgical amputation: Secondary | ICD-10-CM | POA: Diagnosis not present

## 2017-12-27 DIAGNOSIS — S61412D Laceration without foreign body of left hand, subsequent encounter: Secondary | ICD-10-CM | POA: Diagnosis not present

## 2017-12-27 DIAGNOSIS — S61211D Laceration without foreign body of left index finger without damage to nail, subsequent encounter: Secondary | ICD-10-CM | POA: Diagnosis not present

## 2017-12-27 DIAGNOSIS — I482 Chronic atrial fibrillation: Secondary | ICD-10-CM | POA: Diagnosis not present

## 2017-12-27 DIAGNOSIS — L89150 Pressure ulcer of sacral region, unstageable: Secondary | ICD-10-CM | POA: Diagnosis not present

## 2017-12-27 DIAGNOSIS — N183 Chronic kidney disease, stage 3 (moderate): Secondary | ICD-10-CM | POA: Diagnosis not present

## 2017-12-27 DIAGNOSIS — N390 Urinary tract infection, site not specified: Secondary | ICD-10-CM | POA: Diagnosis not present

## 2017-12-27 DIAGNOSIS — I5043 Acute on chronic combined systolic (congestive) and diastolic (congestive) heart failure: Secondary | ICD-10-CM | POA: Diagnosis not present

## 2017-12-27 DIAGNOSIS — E1122 Type 2 diabetes mellitus with diabetic chronic kidney disease: Secondary | ICD-10-CM | POA: Diagnosis not present

## 2017-12-28 DIAGNOSIS — L89150 Pressure ulcer of sacral region, unstageable: Secondary | ICD-10-CM | POA: Diagnosis not present

## 2017-12-28 DIAGNOSIS — N183 Chronic kidney disease, stage 3 (moderate): Secondary | ICD-10-CM | POA: Diagnosis not present

## 2017-12-28 DIAGNOSIS — N401 Enlarged prostate with lower urinary tract symptoms: Secondary | ICD-10-CM | POA: Diagnosis not present

## 2017-12-28 DIAGNOSIS — I5043 Acute on chronic combined systolic (congestive) and diastolic (congestive) heart failure: Secondary | ICD-10-CM | POA: Diagnosis not present

## 2017-12-28 DIAGNOSIS — I482 Chronic atrial fibrillation: Secondary | ICD-10-CM | POA: Diagnosis not present

## 2017-12-28 DIAGNOSIS — Z4781 Encounter for orthopedic aftercare following surgical amputation: Secondary | ICD-10-CM | POA: Diagnosis not present

## 2017-12-28 DIAGNOSIS — S61211D Laceration without foreign body of left index finger without damage to nail, subsequent encounter: Secondary | ICD-10-CM | POA: Diagnosis not present

## 2017-12-28 DIAGNOSIS — E1122 Type 2 diabetes mellitus with diabetic chronic kidney disease: Secondary | ICD-10-CM | POA: Diagnosis not present

## 2017-12-28 DIAGNOSIS — I130 Hypertensive heart and chronic kidney disease with heart failure and stage 1 through stage 4 chronic kidney disease, or unspecified chronic kidney disease: Secondary | ICD-10-CM | POA: Diagnosis not present

## 2017-12-28 DIAGNOSIS — R339 Retention of urine, unspecified: Secondary | ICD-10-CM | POA: Diagnosis not present

## 2017-12-28 DIAGNOSIS — S61412D Laceration without foreign body of left hand, subsequent encounter: Secondary | ICD-10-CM | POA: Diagnosis not present

## 2017-12-28 DIAGNOSIS — N390 Urinary tract infection, site not specified: Secondary | ICD-10-CM | POA: Diagnosis not present

## 2017-12-29 ENCOUNTER — Telehealth: Payer: Self-pay | Admitting: Sports Medicine

## 2017-12-29 DIAGNOSIS — A499 Bacterial infection, unspecified: Secondary | ICD-10-CM | POA: Diagnosis not present

## 2017-12-29 DIAGNOSIS — N183 Chronic kidney disease, stage 3 (moderate): Secondary | ICD-10-CM | POA: Diagnosis not present

## 2017-12-29 DIAGNOSIS — N401 Enlarged prostate with lower urinary tract symptoms: Secondary | ICD-10-CM | POA: Diagnosis not present

## 2017-12-29 DIAGNOSIS — L89150 Pressure ulcer of sacral region, unstageable: Secondary | ICD-10-CM | POA: Diagnosis not present

## 2017-12-29 DIAGNOSIS — N390 Urinary tract infection, site not specified: Secondary | ICD-10-CM | POA: Diagnosis not present

## 2017-12-29 DIAGNOSIS — I5043 Acute on chronic combined systolic (congestive) and diastolic (congestive) heart failure: Secondary | ICD-10-CM | POA: Diagnosis not present

## 2017-12-29 DIAGNOSIS — S61412D Laceration without foreign body of left hand, subsequent encounter: Secondary | ICD-10-CM | POA: Diagnosis not present

## 2017-12-29 DIAGNOSIS — S61211D Laceration without foreign body of left index finger without damage to nail, subsequent encounter: Secondary | ICD-10-CM | POA: Diagnosis not present

## 2017-12-29 DIAGNOSIS — R339 Retention of urine, unspecified: Secondary | ICD-10-CM | POA: Diagnosis not present

## 2017-12-29 DIAGNOSIS — I130 Hypertensive heart and chronic kidney disease with heart failure and stage 1 through stage 4 chronic kidney disease, or unspecified chronic kidney disease: Secondary | ICD-10-CM | POA: Diagnosis not present

## 2017-12-29 DIAGNOSIS — Z4781 Encounter for orthopedic aftercare following surgical amputation: Secondary | ICD-10-CM | POA: Diagnosis not present

## 2017-12-29 DIAGNOSIS — E1122 Type 2 diabetes mellitus with diabetic chronic kidney disease: Secondary | ICD-10-CM | POA: Diagnosis not present

## 2017-12-29 DIAGNOSIS — I482 Chronic atrial fibrillation: Secondary | ICD-10-CM | POA: Diagnosis not present

## 2017-12-29 NOTE — Telephone Encounter (Signed)
Jose Heath - [facility] states pt has an area of slough material medial to the wound closer to the leg, they have been using granulated powder on the wound, but have been using Triad or Santyl to a buttock decubiti to clear that up. I told Jose Heath to use the Santyl to the slough area and I would inform Dr. Cannon Kettle. Jose Heath states pt has an appt tomorrow and I confirmed the appt to be at 11:00am.

## 2017-12-29 NOTE — Telephone Encounter (Signed)
Nurse(Erika) is out with pt. Wanted to speak with Val concerning pt. Message given to Val(RN).

## 2017-12-30 ENCOUNTER — Encounter: Payer: Medicare HMO | Admitting: Sports Medicine

## 2017-12-30 DIAGNOSIS — I482 Chronic atrial fibrillation: Secondary | ICD-10-CM | POA: Diagnosis not present

## 2017-12-30 DIAGNOSIS — I5043 Acute on chronic combined systolic (congestive) and diastolic (congestive) heart failure: Secondary | ICD-10-CM | POA: Diagnosis not present

## 2017-12-30 DIAGNOSIS — N401 Enlarged prostate with lower urinary tract symptoms: Secondary | ICD-10-CM | POA: Diagnosis not present

## 2017-12-30 DIAGNOSIS — L89150 Pressure ulcer of sacral region, unstageable: Secondary | ICD-10-CM | POA: Diagnosis not present

## 2017-12-30 DIAGNOSIS — N390 Urinary tract infection, site not specified: Secondary | ICD-10-CM | POA: Diagnosis not present

## 2017-12-30 DIAGNOSIS — S61412D Laceration without foreign body of left hand, subsequent encounter: Secondary | ICD-10-CM | POA: Diagnosis not present

## 2017-12-30 DIAGNOSIS — Z4781 Encounter for orthopedic aftercare following surgical amputation: Secondary | ICD-10-CM | POA: Diagnosis not present

## 2017-12-30 DIAGNOSIS — N183 Chronic kidney disease, stage 3 (moderate): Secondary | ICD-10-CM | POA: Diagnosis not present

## 2017-12-30 DIAGNOSIS — S61211D Laceration without foreign body of left index finger without damage to nail, subsequent encounter: Secondary | ICD-10-CM | POA: Diagnosis not present

## 2017-12-30 DIAGNOSIS — I130 Hypertensive heart and chronic kidney disease with heart failure and stage 1 through stage 4 chronic kidney disease, or unspecified chronic kidney disease: Secondary | ICD-10-CM | POA: Diagnosis not present

## 2017-12-30 DIAGNOSIS — E1122 Type 2 diabetes mellitus with diabetic chronic kidney disease: Secondary | ICD-10-CM | POA: Diagnosis not present

## 2017-12-30 DIAGNOSIS — R339 Retention of urine, unspecified: Secondary | ICD-10-CM | POA: Diagnosis not present

## 2017-12-31 DIAGNOSIS — I482 Chronic atrial fibrillation: Secondary | ICD-10-CM | POA: Diagnosis not present

## 2017-12-31 DIAGNOSIS — N183 Chronic kidney disease, stage 3 (moderate): Secondary | ICD-10-CM | POA: Diagnosis not present

## 2017-12-31 DIAGNOSIS — I130 Hypertensive heart and chronic kidney disease with heart failure and stage 1 through stage 4 chronic kidney disease, or unspecified chronic kidney disease: Secondary | ICD-10-CM | POA: Diagnosis not present

## 2017-12-31 DIAGNOSIS — L89150 Pressure ulcer of sacral region, unstageable: Secondary | ICD-10-CM | POA: Diagnosis not present

## 2017-12-31 DIAGNOSIS — S61412D Laceration without foreign body of left hand, subsequent encounter: Secondary | ICD-10-CM | POA: Diagnosis not present

## 2017-12-31 DIAGNOSIS — E1122 Type 2 diabetes mellitus with diabetic chronic kidney disease: Secondary | ICD-10-CM | POA: Diagnosis not present

## 2017-12-31 DIAGNOSIS — S61211D Laceration without foreign body of left index finger without damage to nail, subsequent encounter: Secondary | ICD-10-CM | POA: Diagnosis not present

## 2017-12-31 DIAGNOSIS — I5043 Acute on chronic combined systolic (congestive) and diastolic (congestive) heart failure: Secondary | ICD-10-CM | POA: Diagnosis not present

## 2017-12-31 DIAGNOSIS — Z4781 Encounter for orthopedic aftercare following surgical amputation: Secondary | ICD-10-CM | POA: Diagnosis not present

## 2017-12-31 DIAGNOSIS — N390 Urinary tract infection, site not specified: Secondary | ICD-10-CM | POA: Diagnosis not present

## 2018-01-01 ENCOUNTER — Inpatient Hospital Stay (HOSPITAL_COMMUNITY): Payer: Medicare HMO

## 2018-01-01 ENCOUNTER — Inpatient Hospital Stay (HOSPITAL_COMMUNITY)
Admission: AD | Admit: 2018-01-01 | Discharge: 2018-01-21 | DRG: 871 | Disposition: A | Discharge status: Hospice | Payer: Medicare HMO | Source: Other Acute Inpatient Hospital | Attending: Internal Medicine | Admitting: Internal Medicine

## 2018-01-01 DIAGNOSIS — R0902 Hypoxemia: Secondary | ICD-10-CM | POA: Diagnosis not present

## 2018-01-01 DIAGNOSIS — R69 Illness, unspecified: Secondary | ICD-10-CM | POA: Diagnosis not present

## 2018-01-01 DIAGNOSIS — J810 Acute pulmonary edema: Secondary | ICD-10-CM

## 2018-01-01 DIAGNOSIS — R410 Disorientation, unspecified: Secondary | ICD-10-CM | POA: Diagnosis not present

## 2018-01-01 DIAGNOSIS — L8990 Pressure ulcer of unspecified site, unspecified stage: Secondary | ICD-10-CM

## 2018-01-01 DIAGNOSIS — J984 Other disorders of lung: Secondary | ICD-10-CM | POA: Diagnosis not present

## 2018-01-01 DIAGNOSIS — Z781 Physical restraint status: Secondary | ICD-10-CM

## 2018-01-01 DIAGNOSIS — Z95810 Presence of automatic (implantable) cardiac defibrillator: Secondary | ICD-10-CM | POA: Diagnosis not present

## 2018-01-01 DIAGNOSIS — E872 Acidosis: Secondary | ICD-10-CM | POA: Diagnosis not present

## 2018-01-01 DIAGNOSIS — F05 Delirium due to known physiological condition: Secondary | ICD-10-CM | POA: Diagnosis present

## 2018-01-01 DIAGNOSIS — I351 Nonrheumatic aortic (valve) insufficiency: Secondary | ICD-10-CM | POA: Diagnosis not present

## 2018-01-01 DIAGNOSIS — K7460 Unspecified cirrhosis of liver: Secondary | ICD-10-CM | POA: Diagnosis present

## 2018-01-01 DIAGNOSIS — E0801 Diabetes mellitus due to underlying condition with hyperosmolarity with coma: Secondary | ICD-10-CM | POA: Diagnosis not present

## 2018-01-01 DIAGNOSIS — D539 Nutritional anemia, unspecified: Secondary | ICD-10-CM | POA: Diagnosis present

## 2018-01-01 DIAGNOSIS — R188 Other ascites: Secondary | ICD-10-CM

## 2018-01-01 DIAGNOSIS — N390 Urinary tract infection, site not specified: Secondary | ICD-10-CM | POA: Diagnosis present

## 2018-01-01 DIAGNOSIS — E875 Hyperkalemia: Secondary | ICD-10-CM | POA: Diagnosis not present

## 2018-01-01 DIAGNOSIS — N179 Acute kidney failure, unspecified: Secondary | ICD-10-CM | POA: Diagnosis not present

## 2018-01-01 DIAGNOSIS — K7200 Acute and subacute hepatic failure without coma: Secondary | ICD-10-CM | POA: Diagnosis present

## 2018-01-01 DIAGNOSIS — D696 Thrombocytopenia, unspecified: Secondary | ICD-10-CM | POA: Diagnosis not present

## 2018-01-01 DIAGNOSIS — Z66 Do not resuscitate: Secondary | ICD-10-CM | POA: Diagnosis not present

## 2018-01-01 DIAGNOSIS — Z7901 Long term (current) use of anticoagulants: Secondary | ICD-10-CM

## 2018-01-01 DIAGNOSIS — I2510 Atherosclerotic heart disease of native coronary artery without angina pectoris: Secondary | ICD-10-CM | POA: Diagnosis present

## 2018-01-01 DIAGNOSIS — R34 Anuria and oliguria: Secondary | ICD-10-CM | POA: Diagnosis present

## 2018-01-01 DIAGNOSIS — E1165 Type 2 diabetes mellitus with hyperglycemia: Secondary | ICD-10-CM | POA: Diagnosis present

## 2018-01-01 DIAGNOSIS — K7290 Hepatic failure, unspecified without coma: Secondary | ICD-10-CM | POA: Diagnosis not present

## 2018-01-01 DIAGNOSIS — E876 Hypokalemia: Secondary | ICD-10-CM | POA: Diagnosis not present

## 2018-01-01 DIAGNOSIS — E871 Hypo-osmolality and hyponatremia: Secondary | ICD-10-CM | POA: Diagnosis not present

## 2018-01-01 DIAGNOSIS — I4891 Unspecified atrial fibrillation: Secondary | ICD-10-CM | POA: Diagnosis present

## 2018-01-01 DIAGNOSIS — R609 Edema, unspecified: Secondary | ICD-10-CM | POA: Diagnosis not present

## 2018-01-01 DIAGNOSIS — D6959 Other secondary thrombocytopenia: Secondary | ICD-10-CM | POA: Diagnosis present

## 2018-01-01 DIAGNOSIS — S91301A Unspecified open wound, right foot, initial encounter: Secondary | ICD-10-CM | POA: Diagnosis not present

## 2018-01-01 DIAGNOSIS — R451 Restlessness and agitation: Secondary | ICD-10-CM | POA: Diagnosis not present

## 2018-01-01 DIAGNOSIS — J811 Chronic pulmonary edema: Secondary | ICD-10-CM

## 2018-01-01 DIAGNOSIS — I5023 Acute on chronic systolic (congestive) heart failure: Secondary | ICD-10-CM | POA: Diagnosis not present

## 2018-01-01 DIAGNOSIS — Z7189 Other specified counseling: Secondary | ICD-10-CM | POA: Diagnosis not present

## 2018-01-01 DIAGNOSIS — N170 Acute kidney failure with tubular necrosis: Secondary | ICD-10-CM | POA: Diagnosis not present

## 2018-01-01 DIAGNOSIS — E039 Hypothyroidism, unspecified: Secondary | ICD-10-CM | POA: Diagnosis present

## 2018-01-01 DIAGNOSIS — I252 Old myocardial infarction: Secondary | ICD-10-CM

## 2018-01-01 DIAGNOSIS — J9601 Acute respiratory failure with hypoxia: Secondary | ICD-10-CM | POA: Diagnosis not present

## 2018-01-01 DIAGNOSIS — L8991 Pressure ulcer of unspecified site, stage 1: Secondary | ICD-10-CM | POA: Diagnosis not present

## 2018-01-01 DIAGNOSIS — L89153 Pressure ulcer of sacral region, stage 3: Secondary | ICD-10-CM | POA: Diagnosis not present

## 2018-01-01 DIAGNOSIS — G9341 Metabolic encephalopathy: Secondary | ICD-10-CM | POA: Diagnosis not present

## 2018-01-01 DIAGNOSIS — N183 Chronic kidney disease, stage 3 unspecified: Secondary | ICD-10-CM

## 2018-01-01 DIAGNOSIS — R0602 Shortness of breath: Secondary | ICD-10-CM | POA: Diagnosis not present

## 2018-01-01 DIAGNOSIS — R64 Cachexia: Secondary | ICD-10-CM | POA: Diagnosis present

## 2018-01-01 DIAGNOSIS — Z452 Encounter for adjustment and management of vascular access device: Secondary | ICD-10-CM

## 2018-01-01 DIAGNOSIS — Z85828 Personal history of other malignant neoplasm of skin: Secondary | ICD-10-CM

## 2018-01-01 DIAGNOSIS — I132 Hypertensive heart and chronic kidney disease with heart failure and with stage 5 chronic kidney disease, or end stage renal disease: Secondary | ICD-10-CM | POA: Diagnosis present

## 2018-01-01 DIAGNOSIS — A419 Sepsis, unspecified organism: Principal | ICD-10-CM | POA: Diagnosis present

## 2018-01-01 DIAGNOSIS — E440 Moderate protein-calorie malnutrition: Secondary | ICD-10-CM | POA: Diagnosis not present

## 2018-01-01 DIAGNOSIS — N186 End stage renal disease: Secondary | ICD-10-CM | POA: Diagnosis not present

## 2018-01-01 DIAGNOSIS — R231 Pallor: Secondary | ICD-10-CM | POA: Diagnosis not present

## 2018-01-01 DIAGNOSIS — E1151 Type 2 diabetes mellitus with diabetic peripheral angiopathy without gangrene: Secondary | ICD-10-CM | POA: Diagnosis present

## 2018-01-01 DIAGNOSIS — J9690 Respiratory failure, unspecified, unspecified whether with hypoxia or hypercapnia: Secondary | ICD-10-CM

## 2018-01-01 DIAGNOSIS — R570 Cardiogenic shock: Secondary | ICD-10-CM | POA: Diagnosis not present

## 2018-01-01 DIAGNOSIS — R6521 Severe sepsis with septic shock: Secondary | ICD-10-CM

## 2018-01-01 DIAGNOSIS — E11649 Type 2 diabetes mellitus with hypoglycemia without coma: Secondary | ICD-10-CM | POA: Diagnosis present

## 2018-01-01 DIAGNOSIS — J90 Pleural effusion, not elsewhere classified: Secondary | ICD-10-CM | POA: Diagnosis not present

## 2018-01-01 DIAGNOSIS — K5900 Constipation, unspecified: Secondary | ICD-10-CM | POA: Diagnosis present

## 2018-01-01 DIAGNOSIS — I071 Rheumatic tricuspid insufficiency: Secondary | ICD-10-CM | POA: Diagnosis present

## 2018-01-01 DIAGNOSIS — Z79890 Hormone replacement therapy (postmenopausal): Secondary | ICD-10-CM

## 2018-01-01 DIAGNOSIS — S92331A Displaced fracture of third metatarsal bone, right foot, initial encounter for closed fracture: Secondary | ICD-10-CM | POA: Diagnosis not present

## 2018-01-01 DIAGNOSIS — J9600 Acute respiratory failure, unspecified whether with hypoxia or hypercapnia: Secondary | ICD-10-CM

## 2018-01-01 DIAGNOSIS — Z6828 Body mass index (BMI) 28.0-28.9, adult: Secondary | ICD-10-CM

## 2018-01-01 DIAGNOSIS — Z89431 Acquired absence of right foot: Secondary | ICD-10-CM

## 2018-01-01 DIAGNOSIS — N3000 Acute cystitis without hematuria: Secondary | ICD-10-CM | POA: Diagnosis not present

## 2018-01-01 DIAGNOSIS — G9340 Encephalopathy, unspecified: Secondary | ICD-10-CM | POA: Diagnosis not present

## 2018-01-01 DIAGNOSIS — N189 Chronic kidney disease, unspecified: Secondary | ICD-10-CM | POA: Diagnosis not present

## 2018-01-01 DIAGNOSIS — E118 Type 2 diabetes mellitus with unspecified complications: Secondary | ICD-10-CM | POA: Diagnosis not present

## 2018-01-01 DIAGNOSIS — Z7984 Long term (current) use of oral hypoglycemic drugs: Secondary | ICD-10-CM

## 2018-01-01 DIAGNOSIS — F0390 Unspecified dementia without behavioral disturbance: Secondary | ICD-10-CM | POA: Diagnosis present

## 2018-01-01 DIAGNOSIS — S92321A Displaced fracture of second metatarsal bone, right foot, initial encounter for closed fracture: Secondary | ICD-10-CM | POA: Diagnosis not present

## 2018-01-01 DIAGNOSIS — Z79899 Other long term (current) drug therapy: Secondary | ICD-10-CM

## 2018-01-01 DIAGNOSIS — I5082 Biventricular heart failure: Secondary | ICD-10-CM | POA: Diagnosis not present

## 2018-01-01 DIAGNOSIS — D649 Anemia, unspecified: Secondary | ICD-10-CM | POA: Diagnosis not present

## 2018-01-01 DIAGNOSIS — I5022 Chronic systolic (congestive) heart failure: Secondary | ICD-10-CM | POA: Diagnosis not present

## 2018-01-01 DIAGNOSIS — L89151 Pressure ulcer of sacral region, stage 1: Secondary | ICD-10-CM | POA: Diagnosis not present

## 2018-01-01 DIAGNOSIS — M7989 Other specified soft tissue disorders: Secondary | ICD-10-CM | POA: Diagnosis not present

## 2018-01-01 DIAGNOSIS — M869 Osteomyelitis, unspecified: Secondary | ICD-10-CM | POA: Diagnosis present

## 2018-01-01 DIAGNOSIS — E119 Type 2 diabetes mellitus without complications: Secondary | ICD-10-CM

## 2018-01-01 DIAGNOSIS — L89150 Pressure ulcer of sacral region, unstageable: Secondary | ICD-10-CM | POA: Diagnosis present

## 2018-01-01 DIAGNOSIS — E1122 Type 2 diabetes mellitus with diabetic chronic kidney disease: Secondary | ICD-10-CM | POA: Diagnosis present

## 2018-01-01 DIAGNOSIS — Z515 Encounter for palliative care: Secondary | ICD-10-CM | POA: Diagnosis present

## 2018-01-01 DIAGNOSIS — I950 Idiopathic hypotension: Secondary | ICD-10-CM | POA: Diagnosis not present

## 2018-01-01 DIAGNOSIS — I959 Hypotension, unspecified: Secondary | ICD-10-CM | POA: Diagnosis not present

## 2018-01-01 DIAGNOSIS — I9589 Other hypotension: Secondary | ICD-10-CM | POA: Diagnosis not present

## 2018-01-01 DIAGNOSIS — Z951 Presence of aortocoronary bypass graft: Secondary | ICD-10-CM

## 2018-01-01 DIAGNOSIS — X58XXXA Exposure to other specified factors, initial encounter: Secondary | ICD-10-CM | POA: Diagnosis not present

## 2018-01-01 HISTORY — DX: Cardiomyopathy, unspecified: I42.9

## 2018-01-01 HISTORY — DX: Chronic kidney disease, unspecified: N18.9

## 2018-01-01 HISTORY — DX: Osteomyelitis, unspecified: M86.9

## 2018-01-01 HISTORY — DX: Unspecified atrial fibrillation: I48.91

## 2018-01-01 HISTORY — DX: Atherosclerotic heart disease of native coronary artery without angina pectoris: I25.10

## 2018-01-01 HISTORY — DX: Thrombocytopenia, unspecified: D69.6

## 2018-01-01 HISTORY — DX: Type 2 diabetes mellitus without complications: E11.9

## 2018-01-01 HISTORY — DX: Anemia, unspecified: D64.9

## 2018-01-01 HISTORY — DX: Presence of automatic (implantable) cardiac defibrillator: Z95.810

## 2018-01-01 HISTORY — DX: Heart failure, unspecified: I50.9

## 2018-01-01 HISTORY — DX: Acute myocardial infarction, unspecified: I21.9

## 2018-01-01 HISTORY — DX: Hypothyroidism, unspecified: E03.9

## 2018-01-01 LAB — COOXEMETRY PANEL
Carboxyhemoglobin: 1.4 % (ref 0.5–1.5)
Methemoglobin: 1.7 % — ABNORMAL HIGH (ref 0.0–1.5)
O2 SAT: 41.9 %
TOTAL HEMOGLOBIN: 10.3 g/dL — AB (ref 12.0–16.0)

## 2018-01-01 LAB — COMPREHENSIVE METABOLIC PANEL
ALK PHOS: 111 U/L (ref 38–126)
ALT: 91 U/L — AB (ref 0–44)
AST: 173 U/L — AB (ref 15–41)
Albumin: 2.9 g/dL — ABNORMAL LOW (ref 3.5–5.0)
Anion gap: 17 — ABNORMAL HIGH (ref 5–15)
BILIRUBIN TOTAL: 2.2 mg/dL — AB (ref 0.3–1.2)
BUN: 92 mg/dL — AB (ref 8–23)
CALCIUM: 7.3 mg/dL — AB (ref 8.9–10.3)
CO2: 17 mmol/L — ABNORMAL LOW (ref 22–32)
CREATININE: 5.98 mg/dL — AB (ref 0.61–1.24)
Chloride: 95 mmol/L — ABNORMAL LOW (ref 98–111)
GFR, EST AFRICAN AMERICAN: 10 mL/min — AB (ref >60)
GFR, EST NON AFRICAN AMERICAN: 9 mL/min — AB (ref >60)
Glucose, Bld: 127 mg/dL — ABNORMAL HIGH (ref 70–99)
Potassium: 6.2 mmol/L — ABNORMAL HIGH (ref 3.5–5.1)
Sodium: 129 mmol/L — ABNORMAL LOW (ref 135–145)
TOTAL PROTEIN: 6.1 g/dL — AB (ref 6.5–8.1)

## 2018-01-01 LAB — CBC WITH DIFFERENTIAL/PLATELET
BASOS PCT: 0 %
Basophils Absolute: 0 10*3/uL (ref 0.0–0.1)
Eosinophils Absolute: 0 10*3/uL (ref 0.0–0.7)
Eosinophils Relative: 0 %
HEMATOCRIT: 27.8 % — AB (ref 39.0–52.0)
HEMOGLOBIN: 8.3 g/dL — AB (ref 13.0–17.0)
LYMPHS PCT: 3 %
Lymphs Abs: 0.3 10*3/uL — ABNORMAL LOW (ref 0.7–4.0)
MCH: 28.9 pg (ref 26.0–34.0)
MCHC: 29.9 g/dL — ABNORMAL LOW (ref 30.0–36.0)
MCV: 96.9 fL (ref 78.0–100.0)
MONOS PCT: 3 %
Monocytes Absolute: 0.3 10*3/uL (ref 0.1–1.0)
NEUTROS PCT: 94 %
Neutro Abs: 8.2 10*3/uL — ABNORMAL HIGH (ref 1.7–7.7)
Platelets: 180 10*3/uL (ref 150–400)
RBC: 2.87 MIL/uL — ABNORMAL LOW (ref 4.22–5.81)
RDW: 21.4 % — ABNORMAL HIGH (ref 11.5–15.5)
WBC: 8.8 10*3/uL (ref 4.0–10.5)

## 2018-01-01 LAB — POCT I-STAT 3, ART BLOOD GAS (G3+)
ACID-BASE DEFICIT: 10 mmol/L — AB (ref 0.0–2.0)
Bicarbonate: 15 mmol/L — ABNORMAL LOW (ref 20.0–28.0)
O2 Saturation: 100 %
PH ART: 7.325 — AB (ref 7.350–7.450)
TCO2: 16 mmol/L — ABNORMAL LOW (ref 22–32)
pCO2 arterial: 28.2 mmHg — ABNORMAL LOW (ref 32.0–48.0)
pO2, Arterial: 230 mmHg — ABNORMAL HIGH (ref 83.0–108.0)

## 2018-01-01 LAB — MAGNESIUM: MAGNESIUM: 3 mg/dL — AB (ref 1.7–2.4)

## 2018-01-01 LAB — PROTIME-INR
INR: 2.88
Prothrombin Time: 30 seconds — ABNORMAL HIGH (ref 11.4–15.2)

## 2018-01-01 LAB — TROPONIN I: Troponin I: 0.04 ng/mL (ref <0.03)

## 2018-01-01 LAB — CORTISOL: Cortisol, Plasma: 29.5 ug/dL

## 2018-01-01 LAB — MRSA PCR SCREENING: MRSA BY PCR: NEGATIVE

## 2018-01-01 LAB — GLUCOSE, CAPILLARY: Glucose-Capillary: 112 mg/dL — ABNORMAL HIGH (ref 70–99)

## 2018-01-01 LAB — TYPE AND SCREEN
ABO/RH(D): O POS
Antibody Screen: NEGATIVE

## 2018-01-01 LAB — APTT: aPTT: 52 seconds — ABNORMAL HIGH (ref 24–36)

## 2018-01-01 LAB — PROCALCITONIN: Procalcitonin: 0.61 ng/mL

## 2018-01-01 LAB — LACTIC ACID, PLASMA: Lactic Acid, Venous: 2.4 mmol/L (ref 0.5–1.9)

## 2018-01-01 LAB — PHOSPHORUS: PHOSPHORUS: 8.4 mg/dL — AB (ref 2.5–4.6)

## 2018-01-01 LAB — ABO/RH: ABO/RH(D): O POS

## 2018-01-01 MED ORDER — SODIUM CHLORIDE 0.9 % IV SOLN
250.0000 mL | INTRAVENOUS | Status: DC | PRN
  Administered 2018-01-01 – 2018-01-11 (×6): 250 mL via INTRAVENOUS

## 2018-01-01 MED ORDER — PRISMASOL BGK 0/2.5 32-2.5 MEQ/L IV SOLN
INTRAVENOUS | Status: DC
  Administered 2018-01-02 (×3): via INTRAVENOUS_CENTRAL
  Filled 2018-01-01 (×6): qty 5000

## 2018-01-01 MED ORDER — SODIUM CHLORIDE 0.9% IV SOLUTION
Freq: Once | INTRAVENOUS | Status: AC
  Administered 2018-01-01: 23:00:00 via INTRAVENOUS

## 2018-01-01 MED ORDER — INSULIN ASPART 100 UNIT/ML ~~LOC~~ SOLN
2.0000 [IU] | SUBCUTANEOUS | Status: DC
  Administered 2018-01-02 – 2018-01-05 (×6): 2 [IU] via SUBCUTANEOUS
  Administered 2018-01-06: 4 [IU] via SUBCUTANEOUS
  Administered 2018-01-06 (×2): 2 [IU] via SUBCUTANEOUS

## 2018-01-01 MED ORDER — FAMOTIDINE IN NACL 20-0.9 MG/50ML-% IV SOLN
20.0000 mg | Freq: Two times a day (BID) | INTRAVENOUS | Status: DC
  Administered 2018-01-01 – 2018-01-02 (×2): 20 mg via INTRAVENOUS
  Filled 2018-01-01 (×2): qty 50

## 2018-01-01 MED ORDER — SODIUM BICARBONATE 8.4 % IV SOLN
50.0000 meq | Freq: Once | INTRAVENOUS | Status: AC
Start: 2018-01-01 — End: 2018-01-01
  Administered 2018-01-01: 50 meq via INTRAVENOUS
  Filled 2018-01-01: qty 50

## 2018-01-01 MED ORDER — NOREPINEPHRINE 4 MG/250ML-% IV SOLN
5.0000 ug/min | INTRAVENOUS | Status: DC
  Administered 2018-01-01 – 2018-01-02 (×2): 14 ug/min via INTRAVENOUS
  Filled 2018-01-01 (×3): qty 250

## 2018-01-01 MED ORDER — ALBUTEROL SULFATE (2.5 MG/3ML) 0.083% IN NEBU: 2.5000 mg | INHALATION_SOLUTION | RESPIRATORY_TRACT | Status: DC | PRN

## 2018-01-01 MED ORDER — PRISMASOL BGK 0/2.5 32-2.5 MEQ/L IV SOLN
INTRAVENOUS | Status: DC
  Administered 2018-01-02 (×2): via INTRAVENOUS_CENTRAL
  Filled 2018-01-01 (×3): qty 5000

## 2018-01-01 MED ORDER — SODIUM CHLORIDE 0.9 % IV SOLN
1.0000 g | Freq: Once | INTRAVENOUS | Status: AC
  Administered 2018-01-01: 1 g via INTRAVENOUS
  Filled 2018-01-01: qty 10

## 2018-01-01 MED ORDER — ONDANSETRON HCL 4 MG/2ML IJ SOLN: 4.0000 mg | Freq: Four times a day (QID) | INTRAMUSCULAR | Status: DC | PRN

## 2018-01-01 MED ORDER — HEPARIN SODIUM (PORCINE) 1000 UNIT/ML DIALYSIS
1000.0000 [IU] | INTRAMUSCULAR | Status: DC | PRN
  Administered 2018-01-08: 3000 [IU] via INTRAVENOUS_CENTRAL
  Filled 2018-01-01 (×2): qty 6

## 2018-01-01 MED ORDER — SODIUM POLYSTYRENE SULFONATE PO POWD
30.0000 g | Freq: Once | ORAL | Status: AC
  Administered 2018-01-01: 30 g via ORAL
  Filled 2018-01-01 (×2): qty 30

## 2018-01-01 MED ORDER — PRISMASOL BGK 0/2.5 32-2.5 MEQ/L IV SOLN
INTRAVENOUS | Status: DC
  Administered 2018-01-02 – 2018-01-03 (×4): via INTRAVENOUS_CENTRAL
  Filled 2018-01-01 (×5): qty 5000

## 2018-01-01 MED ORDER — DEXTROSE 50 % IV SOLN
1.0000 | Freq: Once | INTRAVENOUS | Status: AC
  Administered 2018-01-01: 50 mL via INTRAVENOUS
  Filled 2018-01-01: qty 50

## 2018-01-01 MED ORDER — PIPERACILLIN-TAZOBACTAM 3.375 G IVPB
3.3750 g | Freq: Two times a day (BID) | INTRAVENOUS | Status: DC
  Filled 2018-01-01: qty 50

## 2018-01-01 MED ORDER — HEPARIN SODIUM (PORCINE) 5000 UNIT/ML IJ SOLN
5000.0000 [IU] | Freq: Three times a day (TID) | INTRAMUSCULAR | Status: DC
  Administered 2018-01-02: 5000 [IU] via SUBCUTANEOUS
  Filled 2018-01-01 (×2): qty 1

## 2018-01-01 MED ORDER — HEPARIN (PORCINE) 2000 UNITS/L FOR CRRT
INTRAVENOUS_CENTRAL | Status: DC | PRN
  Administered 2018-01-07: 15:00:00 via INTRAVENOUS_CENTRAL
  Filled 2018-01-01: qty 1000

## 2018-01-01 MED ORDER — VANCOMYCIN HCL IN DEXTROSE 1-5 GM/200ML-% IV SOLN
1000.0000 mg | INTRAVENOUS | Status: DC
  Administered 2018-01-02 – 2018-01-05 (×4): 1000 mg via INTRAVENOUS
  Filled 2018-01-01 (×5): qty 200

## 2018-01-01 MED ORDER — SODIUM POLYSTYRENE SULFONATE 15 GM/60ML PO SUSP
30.0000 g | Freq: Once | ORAL | Status: DC
  Filled 2018-01-01: qty 120

## 2018-01-01 MED ORDER — ACETAMINOPHEN 325 MG PO TABS
650.0000 mg | ORAL_TABLET | ORAL | Status: DC | PRN
  Administered 2018-01-03 – 2018-01-21 (×11): 650 mg via ORAL
  Filled 2018-01-01 (×11): qty 2

## 2018-01-01 MED ORDER — INSULIN ASPART 100 UNIT/ML IV SOLN
10.0000 [IU] | Freq: Once | INTRAVENOUS | Status: AC
  Administered 2018-01-01: 10 [IU] via INTRAVENOUS

## 2018-01-01 MED ORDER — PIPERACILLIN-TAZOBACTAM 3.375 G IVPB 30 MIN
3.3750 g | Freq: Four times a day (QID) | INTRAVENOUS | Status: DC
  Administered 2018-01-02 – 2018-01-08 (×26): 3.375 g via INTRAVENOUS
  Filled 2018-01-01 (×27): qty 50

## 2018-01-01 NOTE — Progress Notes (Addendum)
Pharmacy Antibiotic Note  Jose Heath is a 67 y.o. male admitted on 01/01/2018 with sepsis - possibly d/t uti or osteo From [facility] in renal failure - given vanc zosyn ~ 1400 no uop since Pending eval from nephro  Plan: Zosyn 3.375 gm iv q12  Vanc 2 g given at OSH -redose by level Nancy Fetter Adjust above doses if CRRT started Monitor renal fx cx vanc lvls prn     No data recorded.  Recent Labs  Lab 01/01/18 1936 01/01/18 1939  WBC  --  8.8  LATICACIDVEN 2.4*  --     CrCl cannot be calculated (No order found.).    No Known Allergies  Levester Fresh, PharmD, BCPS, BCCCP Clinical Pharmacist [phone]  Please check AMION for all [facility] numbers  01/01/2018 8:50 PM  Addum:  Starting CRRT.  Change zosyn to 3.375 gm IV q6 hours.  Vancomyicn 1000mg  IV q24 hours Excell Seltzer, PharmD

## 2018-01-01 NOTE — Consult Note (Signed)
[facility] Nephrology Consultation Note  Requesting MD: Dr. Jimmey Ralph Reason for consult: AKI, hyperkalemia  HPI:  Jose Heath is a 67 y.o. male.  With history of hypertension, diabetes, CAD status post CABG, congestive heart failure with EF of 25 to 30%, AICD, osteomyelitis of right first toe is status post transmetatarsal amputation, A. fib on Eliquis, CKD stage III as per prior note transferred from [facility] for worsening renal failure, septic shock and anuria.  Patient was admitted to [facility] on 6/28 for generalized weakness and decreasing urine output.  He was found to be in septic shock likely due to UTI and osteomyelitis.  Given AKI and hyperkalemia, patient was a started on IV fluid.  Central line was placed and is started on vasopressors including Levophed and dobutamine.  Started broad-spectrum antibiotics with vancomycin and Zosyn.  ICU team was contacted and patient was transfer to [facility].  Patient denied use of NSAIDs or any herbal medication.  He said he noticed decreased urine output for last 4 days.  Denied dysuria, urgency, frequency, diarrhea or constipation.  Reported some nausea and vomiting for about 3 4 days.  He lives with his family.  Losartan, Aldactone, Bactrim listed as home medication.  In the ICU, patient with hypotension.  Currently on Levophed and dobutamine.  Still not making any urine.  He has urinary catheter.  As per prior record it was mentioned that patient has CKD.  Patient reported that he was told to have kidney disease more than 6 months ago when he was advised to drink more water.  He has never seen a nephrologist in the past.  Creatinine, Ser  Date/Time Value Ref Range Status  01/01/2018 07:39 PM 5.98 (H) 0.61 - 1.24 mg/dL Final     PMHx: Hypertension, diabetes, CHF, CAD, CKD stage III  PSH: Surgery  Family Hx: No family history of kidney disease are on dialysis.  Social History: Denies  smoking cigarettes or alcohol abuse.  Allergies: No Known Allergies, reviewed  Medications: Prior to Admission medications   Medication Sig Start Date End Date Taking? Authorizing Provider  amiodarone (PACERONE) 200 MG tablet Take 200 mg by mouth daily. 08/12/16   [provider]  amoxicillin-clavulanate (AUGMENTIN) 875-125 MG tablet Take 1 tablet by mouth 2 (two) times daily. 07/08/17   [name], DPM  apixaban (ELIQUIS) 5 MG TABS tablet Take 5 mg by mouth. 12/06/15   [provider]  atorvastatin (LIPITOR) 40 MG tablet TAKE 1 TABLET ONCE DAILY. 08/11/15   [provider]  atorvastatin (LIPITOR) 40 MG tablet Take 40 mg by mouth daily. 09/09/16   [provider]  becaplermin (REGRANEX) 0.01 % gel Apply to affected area 3 times week. 09/30/16   [name], DPM  carvedilol (COREG) 6.25 MG tablet Take 6.25 mg by mouth 2 (two) times daily. 08/12/16   [provider]  ciprofloxacin (CIPRO) 500 MG tablet Take 1 tablet (500 mg total) by mouth 2 (two) times daily. 09/11/17   [name], DPM  Delafloxacin Meglumine (BAXDELA) 450 MG TABS Take 1 tablet by mouth 2 (two) times daily. 08/14/17   [name], DPM  doxycycline (VIBRAMYCIN) 100 MG capsule Take 1 capsule (100 mg total) by mouth 2 (two) times daily. 11/19/16   Stover, Titorya, DPM  ELIQUIS 2.5 MG TABS tablet TAKE ONE TABLET BY MOUTH TWICE DAILY FOR afib 06/24/16   [provider]  furosemide (LASIX) 40 MG tablet Take 40 mg by mouth 2 (two)  times daily. 07/28/16   [provider]  glimepiride (AMARYL) 2 MG tablet Take 2 mg by mouth.    [provider]  INVANZ 1 g injection See admin instructions. 07/14/16   [provider]  IRON PO Take 325 mg by mouth.    [provider]  levofloxacin (LEVAQUIN) 500 MG tablet Take 1 tablet (500 mg total) by mouth daily. 08/17/17   [name], DPM  levothyroxine (SYNTHROID, LEVOTHROID) 100 MCG tablet Take by mouth. 08/14/16    [provider]  levothyroxine (SYNTHROID, LEVOTHROID) 100 MCG tablet Take 100 mcg by mouth every morning. 08/14/16   [provider]  levothyroxine (SYNTHROID, LEVOTHROID) 88 MCG tablet Take by mouth.    [provider]  levothyroxine (SYNTHROID, LEVOTHROID) 88 MCG tablet Take 88 mcg by mouth daily. 09/09/16   [provider]  Linezolid in Sodium Chloride 600-0.9 MG/300ML-% SOLN  09/02/16   [provider]  losartan (COZAAR) 25 MG tablet Take 25 mg by mouth. 12/06/15   [provider]  losartan (COZAAR) 50 MG tablet Take 50 mg by mouth.    [provider]  metFORMIN (GLUCOPHAGE) 1000 MG tablet Take 1,000 mg by mouth 2 (two) times daily with a meal. 09/09/16   [provider]  mupirocin ointment (BACTROBAN) 2 %  09/24/16   [provider]  simvastatin (ZOCOR) 20 MG tablet Take by mouth.    [provider]  spironolactone (ALDACTONE) 25 MG tablet Take 25 mg by mouth daily. 09/10/16   [provider]  sulfamethoxazole-trimethoprim (BACTRIM DS,SEPTRA DS) 800-160 MG tablet Take 1 tablet by mouth 2 (two) times daily. 12/14/17   [name], DPM  torsemide (DEMADEX) 20 MG tablet TAKE 1 OR 2 TABLETS BY MOUTH EVERY DAY AS DIRECTED 09/10/16   [provider]  XYLOCAINE 1 % (with preservative) injection USE 3.47m with invanz FOR injection 07/16/16   [provider]    I have reviewed the patient's current medications.  Labs:  Results for orders placed or performed during the hospital encounter of 01/01/18 (from the past 48 hour(s))  MRSA PCR Screening     Status: None   Collection Time: 01/01/18  7:32 PM  Result Value Ref Range   MRSA by PCR NEGATIVE NEGATIVE    Comment:        The GeneXpert MRSA Assay (FDA approved for NASAL specimens only), is one component of a comprehensive MRSA colonization surveillance program. It is not intended to diagnose MRSA infection nor to guide or monitor  treatment for MRSA infections. Performed at [facility] [address], [place]  Lactic acid, plasma     Status: Abnormal   Collection Time: 01/01/18  7:36 PM  Result Value Ref Range   Lactic Acid, Venous 2.4 (HH) 0.5 - 1.9 mmol/L    Comment: CRITICAL RESULT CALLED TO, READ BACK BY AND VERIFIED WITH: B CUMMINGS,RN 2048 01/01/18 D BRADLEY Performed at [facility] [address], [place] [address]  Cortisol     Status: None   Collection Time: 01/01/18  7:36 PM  Result Value Ref Range   Cortisol, Plasma 29.5 ug/dL    Comment: (NOTE) AM    6.7 - 22.6 ug/dL PM   <10.0       ug/dL Performed at [address] [place] [address]  Comprehensive metabolic panel     Status: Abnormal   Collection Time:  01/01/18  7:39 PM  Result Value Ref Range   Sodium 129 (L) 135 - 145 mmol/L   Potassium 6.2 (H) 3.5 - 5.1 mmol/L   Chloride 95 (L) 98 - 111 mmol/L    Comment: Please note change in reference range.   CO2 17 (L) 22 - 32 mmol/L   Glucose, Bld 127 (H) 70 - 99 mg/dL    Comment: Please note change in reference range.   BUN 92 (H) 8 - 23 mg/dL    Comment: Please note change in reference range.   Creatinine, Ser 5.98 (H) 0.61 - 1.24 mg/dL   Calcium 7.3 (L) 8.9 - 10.3 mg/dL   Total Protein 6.1 (L) 6.5 - 8.1 g/dL   Albumin 2.9 (L) 3.5 - 5.0 g/dL   AST 173 (H) 15 - 41 U/L   ALT 91 (H) 0 - 44 U/L    Comment: Please note change in reference range.   Alkaline Phosphatase 111 38 - 126 U/L   Total Bilirubin 2.2 (H) 0.3 - 1.2 mg/dL   GFR calc non Af Amer 9 (L) >60 mL/min   GFR calc Af Amer 10 (L) >60 mL/min    Comment: (NOTE) The eGFR has been calculated using the CKD EPI equation. This calculation has not been validated in all clinical situations. eGFR's persistently <60 mL/min signify possible Chronic Kidney Disease.    Anion gap 17 (H) 5 - 15    Comment: Performed at [facility], [place] [address],  [place], [place] [address]  Magnesium     Status: Abnormal   Collection Time: 01/01/18  7:39 PM  Result Value Ref Range   Magnesium 3.0 (H) 1.7 - 2.4 mg/dL    Comment: Performed at [facility] [address], [place], [place] [address]  Phosphorus     Status: Abnormal   Collection Time: 01/01/18  7:39 PM  Result Value Ref Range   Phosphorus 8.4 (H) 2.5 - 4.6 mg/dL    Comment: Performed at [facility] [address], [place], [place] [address]  Troponin I     Status: Abnormal   Collection Time: 01/01/18  7:39 PM  Result Value Ref Range   Troponin I 0.04 (HH) <0.03 ng/mL    Comment: CRITICAL RESULT CALLED TO, READ BACK BY AND VERIFIED WITH: C [facility] 2049 01/01/18 D BRADLEY Performed at [facility], [place] [address] [place],  [address]   CBC WITH DIFFERENTIAL     Status: Abnormal   Collection Time: 01/01/18  7:39 PM  Result Value Ref Range   WBC 8.8 4.0 - 10.5 K/uL   RBC 2.87 (L) 4.22 - 5.81 MIL/uL   Hemoglobin 8.3 (L) 13.0 - 17.0 g/dL   HCT 27.8 (L) 39.0 - 52.0 %   MCV 96.9 78.0 - 100.0 fL   MCH 28.9 26.0 - 34.0 pg   MCHC 29.9 (L) 30.0 - 36.0 g/dL   RDW 21.4 (H) 11.5 - 15.5 %   Platelets 180 150 - 400 K/uL   Neutrophils Relative % 94 %   Lymphocytes Relative 3 %   Monocytes Relative 3 %   Eosinophils Relative 0 %   Basophils Relative 0 %   Neutro Abs 8.2 (H) 1.7 - 7.7 K/uL   Lymphs Abs 0.3 (L) 0.7 - 4.0 K/uL   Monocytes Absolute 0.3 0.1 - 1.0 K/uL   Eosinophils Absolute 0.0 0.0 - 0.7 K/uL   Basophils Absolute 0.0 0.0 - 0.1 K/uL  RBC Morphology POLYCHROMASIA PRESENT     Comment: ELLIPTOCYTES Performed at [facility], [place] [address] [place], [place] [address]   Protime-INR     Status: Abnormal   Collection Time: 01/01/18  7:39 PM  Result Value Ref Range   Prothrombin Time 30.0 (H) 11.4 - 15.2 seconds   INR 2.88     Comment: Performed at [facility] [address], [place], [place] [address]  APTT     Status: Abnormal    Collection Time: 01/01/18  7:39 PM  Result Value Ref Range   aPTT 52 (H) 24 - 36 seconds    Comment:        IF BASELINE aPTT IS ELEVATED, SUGGEST PATIENT RISK ASSESSMENT BE USED TO DETERMINE APPROPRIATE ANTICOAGULANT THERAPY. Performed at [facility], [place] [address] [place],  [address]   I-STAT 3, arterial blood gas (G3+)     Status: Abnormal   Collection Time: 01/01/18  8:07 PM  Result Value Ref Range   pH, Arterial 7.325 (L) 7.350 - 7.450   pCO2 arterial 28.2 (L) 32.0 - 48.0 mmHg   pO2, Arterial 230.0 (H) 83.0 - 108.0 mmHg   Bicarbonate 15.0 (L) 20.0 - 28.0 mmol/L   TCO2 16 (L) 22 - 32 mmol/L   O2 Saturation 100.0 %   Acid-base deficit 10.0 (H) 0.0 - 2.0 mmol/L   Patient temperature 95.8 F    Collection site BRACHIAL ARTERY    Drawn by Operator    Sample type ARTERIAL   .Cooxemetry Panel (carboxy, met, total hgb, O2 sat)     Status: Abnormal   Collection Time: 01/01/18  8:13 PM  Result Value Ref Range   Total hemoglobin 10.3 (L) 12.0 - 16.0 g/dL   O2 Saturation 41.9 %   Carboxyhemoglobin 1.4 0.5 - 1.5 %   Methemoglobin 1.7 (H) 0.0 - 1.5 %  Type and screen [place]     Status: None (Preliminary result)   Collection Time: 01/01/18 10:23 PM  Result Value Ref Range   ABO/RH(D) O POS    Antibody Screen PENDING    Sample Expiration      01/04/2018 Performed at [facility], [place] [address], [place], [place] [address]   Glucose, capillary     Status: Abnormal   Collection Time: 01/01/18 10:25 PM  Result Value Ref Range   Glucose-Capillary 112 (H) 70 - 99 mg/dL   Comment 1 Notify RN      ROS:  Pertinent items noted in HPI and remainder of comprehensive ROS otherwise negative.  Physical Exam: Vitals:   01/01/18 2010 01/01/18 2021  Resp: 15 17  Temp:  99 F (37.2 C)  SpO2: 96%      General exam: Ill looking male, not in distress Respiratory system: Coarse breath sound bilateral, respiratory effort okay, Cardiovascular  system: S1 & S2 heard, RRR.  Bilateral lower extremity trace edema. Gastrointestinal system: Abdomen is nondistended, soft and nontender. Normal bowel sounds heard. Central nervous system: Alert and oriented. No focal neurological deficits. Extremities: Superficial ulcer and dressing applied in foot. Skin: Erythema of right lower leg. Psychiatry: Judgement and insight appear normal. Mood & affect appropriate.   Assessment/Plan:  # Anuric AKI on CKD likely ATN in the setting of septic shock, losartan, Aldactone and Bactrim: -Patient is anuric, serum creatinine level 5.98, CO2 17 and serum potassium level of 6.2.  Given shock, plan to start CRRT today after placement of dialysis catheter.  Discussed with the patient and he agreed with the plan.  ICU team is placing dialysis catheter after correction of coagulopathy.  Use 0K bath today with UF goal of around 50 cc an hour if tolerated by blood pressure.  He has mild fluid overload. -CKD likely in the setting of CHF, diabetes.  Unknown baseline serum creatinine level. -Check urinalysis.  The UA done at [facility] consistent with UTI. -Monitor urine output, BMP and watch for renal recovery. -Avoid nephrotoxins.  #Hypokalemia in the setting of renal failure/ARB/Aldactone and bactrim: Receiving medical treatment in ICU currently.  Plan for CRRT with 0 potassium tonight.  Repeat lab in the morning.  #Hyponatremia likely hypervolemic.  Starting dialysis.  Check urine lites and osmolality.  #History of CHF: Checking echocardiogram.  Volume optimization.  Received around 2 L [facility] in [facility].  #Septic shock: Likely due to UTI and osteomyelitis.  Follow-up culture results.  On broad-spectrum antibiotics.  On Levophed and dobutamine currently.  Blood pressure acceptable.  Management per ICU team.  Thank you for the consult.  We will continue to follow with you.  Discussed with ICU nurse and Dr. Jimmey Ralph.  Jacquelynn Friend Tanna Furry 01/01/2018,  11:01 PM  [facility].

## 2018-01-01 NOTE — H&P (Signed)
PULMONARY / CRITICAL CARE MEDICINE   Name: Jose Carns MRN: [id number] DOB: 1950/11/21    ADMISSION DATE:  01/01/2018 CONSULTATION DATE: 01/01/18  REFERRING MD: Transfer from [place]: Septic shock  HISTORY OF PRESENT ILLNESS:   [phone] with hx CAD s/p CABG, CHF (EF 25-30%), AICD, DM, Osteomyelitis of Right 1st toe s/p transmetatarsal amputation, Afib (on eliquis), and and CKD Stage 3, presented today to [place] center c/o fatigue and no UOP x 1 day. He was found there to be in septic shock due to UTI +/- Osteo, AKI-on-CKD, and Hyperkalemia. A Central line was placed. Patient was given 2L IVF bolus and started on broad spectrum antibiotics (Vanc and Zosyn) and vasopressors (Levophed and Dobutamine). He was then transferred to [facility] for continued care and consideration of dialysis.   On my interview of patient, he denies F/C, SOB, Cough, Abd pain, Diarrhea, HA, Neck pain, CP, Dysuria, or Flank pain. He reports N/V once daily for past 4 days. He reports good PO intake.   PAST MEDICAL HISTORY :  He  has no past medical history on file.  PAST SURGICAL HISTORY: He  has no past surgical history on file.  No Known Allergies  No current facility-administered medications on file prior to encounter.    Current Outpatient Medications on File Prior to Encounter  Medication Sig  . amiodarone (PACERONE) 200 MG tablet Take 200 mg by mouth daily.  Marland Kitchen amoxicillin-clavulanate (AUGMENTIN) 875-125 MG tablet Take 1 tablet by mouth 2 (two) times daily.  Marland Kitchen apixaban (ELIQUIS) 5 MG TABS tablet Take 5 mg by mouth.  Marland Kitchen atorvastatin (LIPITOR) 40 MG tablet TAKE 1 TABLET ONCE DAILY.  Marland Kitchen atorvastatin (LIPITOR) 40 MG tablet Take 40 mg by mouth daily.  . becaplermin (REGRANEX) 0.01 % gel Apply to affected area 3 times week.  . carvedilol (COREG) 6.25 MG tablet Take 6.25 mg by mouth 2 (two) times daily.  . ciprofloxacin (CIPRO) 500 MG tablet Take 1 tablet (500 mg total) by  mouth 2 (two) times daily.  . Delafloxacin Meglumine (BAXDELA) 450 MG TABS Take 1 tablet by mouth 2 (two) times daily.  Marland Kitchen doxycycline (VIBRAMYCIN) 100 MG capsule Take 1 capsule (100 mg total) by mouth 2 (two) times daily.  Marland Kitchen ELIQUIS 2.5 MG TABS tablet TAKE ONE TABLET BY MOUTH TWICE DAILY FOR afib  . furosemide (LASIX) 40 MG tablet Take 40 mg by mouth 2 (two) times daily.  Marland Kitchen glimepiride (AMARYL) 2 MG tablet Take 2 mg by mouth.  Colbert Ewing 1 g injection See admin instructions.  . IRON PO Take 325 mg by mouth.  . levofloxacin (LEVAQUIN) 500 MG tablet Take 1 tablet (500 mg total) by mouth daily.  Marland Kitchen levothyroxine (SYNTHROID, LEVOTHROID) 100 MCG tablet Take by mouth.  . levothyroxine (SYNTHROID, LEVOTHROID) 100 MCG tablet Take 100 mcg by mouth every morning.  Marland Kitchen levothyroxine (SYNTHROID, LEVOTHROID) 88 MCG tablet Take by mouth.  . levothyroxine (SYNTHROID, LEVOTHROID) 88 MCG tablet Take 88 mcg by mouth daily.  . Linezolid in Sodium Chloride 600-0.9 MG/300ML-% SOLN   . losartan (COZAAR) 25 MG tablet Take 25 mg by mouth.  . losartan (COZAAR) 50 MG tablet Take 50 mg by mouth.  . metFORMIN (GLUCOPHAGE) 1000 MG tablet Take 1,000 mg by mouth 2 (two) times daily with a meal.  . mupirocin ointment (BACTROBAN) 2 %   . simvastatin (ZOCOR) 20 MG tablet Take by mouth.  . spironolactone (ALDACTONE) 25 MG tablet Take 25 mg by mouth  daily.  . sulfamethoxazole-trimethoprim (BACTRIM DS,SEPTRA DS) 800-160 MG tablet Take 1 tablet by mouth 2 (two) times daily.  Marland Kitchen torsemide (DEMADEX) 20 MG tablet TAKE 1 OR 2 TABLETS BY MOUTH EVERY DAY AS DIRECTED  . XYLOCAINE 1 % (with preservative) injection USE 3.54ml with invanz FOR injection   FAMILY HISTORY:  His has no family status information on file.   SOCIAL HISTORY: He  has an unknown smoking status. He has never used smokeless tobacco.  REVIEW OF SYSTEMS:   Review of Systems  Constitutional: Positive for malaise/fatigue. Negative for chills and fever.  HENT: Negative.    Eyes: Negative.   Respiratory: Negative.   Cardiovascular: Negative.   Gastrointestinal: Positive for nausea and vomiting. Negative for abdominal pain and diarrhea.  Genitourinary: Negative.  Negative for dysuria and flank pain.  Musculoskeletal: Negative.   Skin: Negative.   Neurological: Negative.   Endo/Heme/Allergies: Negative.   Psychiatric/Behavioral: Negative.    SUBJECTIVE:  Lying on ICU bed, Awake, Very pale, in NAD  VITAL SIGNS: There were no vitals taken for this visit.  HEMODYNAMICS:  Levophed @ 39mcg, Dobutamine @ 2.5  INTAKE / OUTPUT: No intake/output data recorded.  PHYSICAL EXAMINATION: General: Elderly male, Chronically ill appearing, now critically ill from septic shock Neuro: Awake but lethargic appearing, oriented x 3, moving all extremities, answering questions, obeying commands HEENT: OP clear, MM dry Cardiovascular: RRR no m/r/g Lungs: CTA b/l, no accessory muscle use. Speaking in full sentences Abdomen: Distended and firm, nontender, BS hypoactive Musculoskeletal: Right foot with transmetatarsal amputation of 1st toe, open wound with warm beefy flesh and in the middle of the wound a black escar is present. No visible exposed bone. Left anterior lower leg with bullous lesion; Severe PVD changes on both lower extremities Skin: Faint lacy rash right inguinal area; no increased warmth or tenderness; Stage 4 sacral decubitus ulcer  LABS:  BMET No results for input(s): NA, K, CL, CO2, BUN, CREATININE, GLUCOSE in the last 168 hours.  Electrolytes No results for input(s): CALCIUM, MG, PHOS in the last 168 hours.  CBC No results for input(s): WBC, HGB, HCT, PLT in the last 168 hours.  Coag's No results for input(s): APTT, INR in the last 168 hours.  Sepsis Markers No results for input(s): LATICACIDVEN, PROCALCITON, O2SATVEN in the last 168 hours.  ABG Recent Labs  Lab 01/01/18 2007  PHART 7.325*  PCO2ART 28.2*  PO2ART 230.0*   Liver  Enzymes No results for input(s): AST, ALT, ALKPHOS, BILITOT, ALBUMIN in the last 168 hours.  Cardiac Enzymes No results for input(s): TROPONINI, PROBNP in the last 168 hours.  Glucose No results for input(s): GLUCAP in the last 168 hours.  Imaging No results found.  STUDIES:  Renal Ultrasound: ordered  CULTURES: Blood cultures (6/28): pending Urine cultures (6/28): pending Sputum culture (6/28): ordered   ANTIBIOTICS: Vancomycin 6/28>> Zosyn 6/28>>  SIGNIFICANT EVENTS: 6/28: presented to [facility] with c/o fatigue and decreased UOP. Found to be in septic shock with AKI-on-CKD and Hyperkalemia; transferred to [facility]  LINES/TUBES: RIJ TLC (6/28 placed at [facility]) Foley catheter (6/28, placed at [facility]) PIV's  DISCUSSION: [phone] with hx CAD s/p CABG, CHF (EF 25-30%), AICD, DM, Osteomyelitis of Right 1st toe s/p transmetatarsal amputation, Afib (on eliquis), and and CKD Stage 3, presented today to [place] center c/o fatigue and no UOP x 1 day. He was found there to be in septic shock due to UTI +/- Osteo, AKI-on-CKD, and Hyperkalemia. A Central  line was placed. Patient was given 2L IVF bolus and started on broad spectrum antibiotics (Vanc and Zosyn) and vasopressors (Levophed and Dobutamine). He was then transferred to [facility] for continued care and consideration of dialysis  ASSESSMENT / PLAN:  PULMONARY 1. Acute Hypoxic Respiratory Failure; Pulmonary edema: - CXR on my review shows pulmonary edema but no clear infiltrates - Pox not picking up well due to his severe PVD as well as poor perfusion in setting of active shock; ABG shows paO2 230 on venti mask. No respiratory distress. Try to minimize IVF's given to prevent overload.   CARDIOVASCULAR 1. Shock; Hx CAD, CHF, Afib - hold home meds of lasix, losartan, and carvedilol given the shock - hold eliquis for now given need to place HD catheter. - is on PO Amiodarone at  home for rate control of his Afib; HR currently 70. Hold med for now as patient is very lethargic unclear if he will do well with swallowing pills. May need to start Amiodarone infusion.   RENAL 1. AKI-on-Stage 3 CKD; Hyperkalemia: - creatinine 5.98, up from 5.90 this morning at [facility], and that is despite being given 2L IVF bolus at [facility] - foley catheter in place with minimal dark "tea colored" urine - UA at outside hospital consistent with UTI; repeat UA here and obtain Urine lytes - Order Renal ultrasound - K 5.4 at [facility] for which he received 15g Kayexylate. Despite this on repeat K now 6.2. Unable to assess if peaked Tw[place] as EKG is paced rhythm. Will give 1g Calcium, 1 amp Bicarb, 10u insulin, and 1 amp D50 now. Will also give 30g Kayexylate now.  - will very likely need CRRT tonight, however currently INR is 2.88; will give FFP and repeat INR prior to placing HD catheter.  - Nephrology (Dr Jonnie Finner) already aware of patient per [facility] records; have also called nighttime Nephrologist (Dr Carolin Sicks) on call  GASTROINTESTINAL 1. N/V - patient reported 1 episode N/V daily for past 4 days; no N/V now; Zofran PRN  HEMATOLOGIC 1. Anemia: - Hgb 8.3 from unknown baseline; no clinically obvious signs of blood loss. Continue to monitor.   INFECTIOUS 1. Septic Shock: due to UTI and Possibly Osteomyelitis - s/p 2L IVF bolus at outside hospital; CVP currently 29 and has pulm edema on my review of CXR. So will hold off on anymore IVF.  - continue monitoring CVP q4hrs - continue Vanc and Zosyn - pancultured pending - lactate 2.4 (from 2.3 this AM at [facility]); continue to monitor - cortisol and procalcitonin ordered - continue vasopressors and wean as tolerated.   ENDOCRINE 1. DM: - NPO; SSI   NEUROLOGIC No active issues  FAMILY  - Updated patient's son and daughter-in-law at his bedside. Patient lives with his sister and has a home health nurse for his wound care. Patient is  unmarried and has 1 child (the son who is at bedside). He does not have a medical POA. He is agreeable to his son and his sister making decisions together for him if need be. He affirms that he is FULL CODE - Inter-disciplinary family meet or Palliative Care meeting due by: 01/07/18  60 minutes critical care time  Vernie Murders, MD  Pulmonary and [place] Pager: [phone]  01/01/2018, 8:14 PM

## 2018-01-02 ENCOUNTER — Inpatient Hospital Stay (HOSPITAL_COMMUNITY): Payer: Medicare HMO

## 2018-01-02 ENCOUNTER — Other Ambulatory Visit: Payer: Self-pay

## 2018-01-02 ENCOUNTER — Encounter (HOSPITAL_COMMUNITY): Payer: Self-pay | Admitting: *Deleted

## 2018-01-02 DIAGNOSIS — L8990 Pressure ulcer of unspecified site, unspecified stage: Secondary | ICD-10-CM

## 2018-01-02 DIAGNOSIS — I351 Nonrheumatic aortic (valve) insufficiency: Secondary | ICD-10-CM

## 2018-01-02 LAB — OSMOLALITY: Osmolality: 298 mOsm/kg — ABNORMAL HIGH (ref 275–295)

## 2018-01-02 LAB — GLUCOSE, CAPILLARY
GLUCOSE-CAPILLARY: 73 mg/dL (ref 70–99)
GLUCOSE-CAPILLARY: 82 mg/dL (ref 70–99)
GLUCOSE-CAPILLARY: 90 mg/dL (ref 70–99)
Glucose-Capillary: 122 mg/dL — ABNORMAL HIGH (ref 70–99)
Glucose-Capillary: 108 mg/dL — ABNORMAL HIGH (ref 70–99)
Glucose-Capillary: 88 mg/dL (ref 70–99)

## 2018-01-02 LAB — RENAL FUNCTION PANEL
Albumin: 2.8 g/dL — ABNORMAL LOW (ref 3.5–5.0)
Anion gap: 16 — ABNORMAL HIGH (ref 5–15)
BUN: 74 mg/dL — AB (ref 8–23)
CALCIUM: 7.2 mg/dL — AB (ref 8.9–10.3)
CO2: 18 mmol/L — ABNORMAL LOW (ref 22–32)
CREATININE: 5.12 mg/dL — AB (ref 0.61–1.24)
Chloride: 95 mmol/L — ABNORMAL LOW (ref 98–111)
GFR calc Af Amer: 12 mL/min — ABNORMAL LOW (ref >60)
GFR calc non Af Amer: 11 mL/min — ABNORMAL LOW (ref >60)
GLUCOSE: 128 mg/dL — AB (ref 70–99)
Phosphorus: 6.7 mg/dL — ABNORMAL HIGH (ref 2.5–4.6)
Potassium: 4.3 mmol/L (ref 3.5–5.1)
SODIUM: 129 mmol/L — AB (ref 135–145)

## 2018-01-02 LAB — PROCALCITONIN: PROCALCITONIN: 0.72 ng/mL

## 2018-01-02 LAB — MAGNESIUM: Magnesium: 2.6 mg/dL — ABNORMAL HIGH (ref 1.7–2.4)

## 2018-01-02 LAB — BPAM FFP
Blood Product Expiration Date: 201907032359
ISSUE DATE / TIME: 201906282335
Unit Type and Rh: 5100

## 2018-01-02 LAB — POCT I-STAT 3, ART BLOOD GAS (G3+)
ACID-BASE DEFICIT: 10 mmol/L — AB (ref 0.0–2.0)
BICARBONATE: 14.9 mmol/L — AB (ref 20.0–28.0)
O2 SAT: 99 %
PO2 ART: 120 mmHg — AB (ref 83.0–108.0)
Patient temperature: 98
TCO2: 16 mmol/L — AB (ref 22–32)
pCO2 arterial: 27.6 mmHg — ABNORMAL LOW (ref 32.0–48.0)
pH, Arterial: 7.338 — ABNORMAL LOW (ref 7.350–7.450)

## 2018-01-02 LAB — CBC
HCT: 25.5 % — ABNORMAL LOW (ref 39.0–52.0)
Hemoglobin: 7.7 g/dL — ABNORMAL LOW (ref 13.0–17.0)
MCH: 28.9 pg (ref 26.0–34.0)
MCHC: 30.2 g/dL (ref 30.0–36.0)
MCV: 95.9 fL (ref 78.0–100.0)
PLATELETS: 169 10*3/uL (ref 150–400)
RBC: 2.66 MIL/uL — ABNORMAL LOW (ref 4.22–5.81)
RDW: 21.4 % — AB (ref 11.5–15.5)
WBC: 7.7 10*3/uL (ref 4.0–10.5)

## 2018-01-02 LAB — HEPARIN LEVEL (UNFRACTIONATED)
Heparin Unfractionated: >2.2 IU/mL — ABNORMAL HIGH (ref 0.30–0.70)
Heparin Unfractionated: >2.2 IU/mL — ABNORMAL HIGH (ref 0.30–0.70)

## 2018-01-02 LAB — BASIC METABOLIC PANEL
Anion gap: 13 (ref 5–15)
BUN: 60 mg/dL — AB (ref 8–23)
CALCIUM: 7 mg/dL — AB (ref 8.9–10.3)
CO2: 22 mmol/L (ref 22–32)
CREATININE: 4.22 mg/dL — AB (ref 0.61–1.24)
Chloride: 95 mmol/L — ABNORMAL LOW (ref 98–111)
GFR calc Af Amer: 16 mL/min — ABNORMAL LOW (ref >60)
GFR, EST NON AFRICAN AMERICAN: 13 mL/min — AB (ref >60)
GLUCOSE: 80 mg/dL (ref 70–99)
Potassium: 3.7 mmol/L (ref 3.5–5.1)
Sodium: 130 mmol/L — ABNORMAL LOW (ref 135–145)

## 2018-01-02 LAB — URINALYSIS, ROUTINE W REFLEX MICROSCOPIC
GLUCOSE, UA: NEGATIVE mg/dL
Ketones, ur: NEGATIVE mg/dL
Nitrite: NEGATIVE
PH: 5 (ref 5.0–8.0)
Protein, ur: 30 mg/dL — AB
SPECIFIC GRAVITY, URINE: 1.02 (ref 1.005–1.030)

## 2018-01-02 LAB — PREPARE FRESH FROZEN PLASMA: Unit division: 0

## 2018-01-02 LAB — APTT

## 2018-01-02 LAB — ECHOCARDIOGRAM COMPLETE: Weight: 3467.39 oz

## 2018-01-02 LAB — PHOSPHORUS: Phosphorus: 5.4 mg/dL — ABNORMAL HIGH (ref 2.5–4.6)

## 2018-01-02 LAB — HIV ANTIBODY (ROUTINE TESTING W REFLEX): HIV Screen 4th Generation wRfx: NONREACTIVE

## 2018-01-02 LAB — TROPONIN I
TROPONIN I: 0.03 ng/mL — AB (ref <0.03)
Troponin I: 0.03 ng/mL (ref <0.03)

## 2018-01-02 LAB — LACTIC ACID, PLASMA: Lactic Acid, Venous: 1.8 mmol/L (ref 0.5–1.9)

## 2018-01-02 MED ORDER — SODIUM CHLORIDE 0.9% FLUSH
10.0000 mL | Freq: Two times a day (BID) | INTRAVENOUS | Status: DC
  Administered 2018-01-02 – 2018-01-08 (×14): 10 mL
  Administered 2018-01-09: 30 mL
  Administered 2018-01-09 – 2018-01-17 (×5): 10 mL

## 2018-01-02 MED ORDER — HEPARIN (PORCINE) IN NACL 100-0.45 UNIT/ML-% IJ SOLN
700.0000 [IU]/h | INTRAMUSCULAR | Status: DC
  Administered 2018-01-02: 1250 [IU]/h via INTRAVENOUS
  Filled 2018-01-02: qty 250

## 2018-01-02 MED ORDER — SODIUM CHLORIDE 0.9% FLUSH
10.0000 mL | INTRAVENOUS | Status: DC | PRN
  Administered 2018-01-06: 10 mL
  Administered 2018-01-13: 30 mL
  Filled 2018-01-02 (×2): qty 40

## 2018-01-02 MED ORDER — DOBUTAMINE IN D5W 4-5 MG/ML-% IV SOLN
7.5000 ug/kg/min | INTRAVENOUS | Status: DC
  Administered 2018-01-02: 4 ug/kg/min via INTRAVENOUS
  Administered 2018-01-03 – 2018-01-08 (×6): 7.5 ug/kg/min via INTRAVENOUS
  Filled 2018-01-02 (×8): qty 250

## 2018-01-02 MED ORDER — FAMOTIDINE IN NACL 20-0.9 MG/50ML-% IV SOLN
20.0000 mg | INTRAVENOUS | Status: DC
  Administered 2018-01-03 – 2018-01-06 (×4): 20 mg via INTRAVENOUS
  Filled 2018-01-02 (×4): qty 50

## 2018-01-02 MED ORDER — PERFLUTREN LIPID MICROSPHERE
1.0000 mL | INTRAVENOUS | Status: AC | PRN
  Administered 2018-01-02: 2 mL via INTRAVENOUS
  Filled 2018-01-02: qty 10

## 2018-01-02 MED ORDER — NOREPINEPHRINE 16 MG/250ML-% IV SOLN
5.0000 ug/min | INTRAVENOUS | Status: DC
  Administered 2018-01-02: 3 ug/min via INTRAVENOUS
  Administered 2018-01-02: 10 ug/min via INTRAVENOUS
  Administered 2018-01-05: 2 ug/min via INTRAVENOUS
  Filled 2018-01-02 (×2): qty 250

## 2018-01-02 MED ORDER — CHLORHEXIDINE GLUCONATE CLOTH 2 % EX PADS
6.0000 | MEDICATED_PAD | Freq: Every day | CUTANEOUS | Status: DC
  Administered 2018-01-02 – 2018-01-21 (×18): 6 via TOPICAL

## 2018-01-02 MED ORDER — PRISMASOL BGK 4/2.5 32-4-2.5 MEQ/L IV SOLN
INTRAVENOUS | Status: DC
  Administered 2018-01-02 – 2018-01-08 (×39): via INTRAVENOUS_CENTRAL
  Filled 2018-01-02 (×47): qty 5000

## 2018-01-02 MED ORDER — ORAL CARE MOUTH RINSE
15.0000 mL | Freq: Two times a day (BID) | OROMUCOSAL | Status: DC
  Administered 2018-01-02 – 2018-01-21 (×34): 15 mL via OROMUCOSAL

## 2018-01-02 NOTE — Progress Notes (Signed)
PULMONARY / CRITICAL CARE MEDICINE   Name: Osiris Odriscoll MRN: [id number] DOB: [birth year]    ADMISSION DATE:  01/01/2018 CONSULTATION DATE: 01/01/18  REFERRING MD: Transfer from [place]: Septic shock  HISTORY OF PRESENT ILLNESS:   [phone] with hx CAD s/p CABG, CHF (EF 25-30%), AICD, DM, Osteomyelitis of Right 1st toe s/p transmetatarsal amputation, Afib (on eliquis), and and CKD Stage 3, presented today to [place] [facility] c/o fatigue and no UOP x 1 day. He was found there to be in septic shock due to UTI +/- Osteo, AKI-on-CKD, and Hyperkalemia. A Central line was placed. Patient was given 2L IVF bolus and started on broad spectrum antibiotics (Vanc and Zosyn) and vasopressors (Levophed and Dobutamine). He was then transferred to [facility] for continued care and consideration of dialysis.    SUBJECTIVE:  Admitted overnight, started on CRRT    VITAL SIGNS: BP 111/68   Pulse 70   Temp (!) 96.3 F (35.7 C) (Axillary)   Resp 14   Wt 98.3 kg (216 lb 11.4 oz)   SpO2 (!) 82%   HEMODYNAMICS: CVP:  [0 mmHg-30 mmHg] 21 mmHg  VENTILATOR SETTINGS: FiO2 (%):  [55 %-100 %] 55 %  INTAKE / OUTPUT: I/O last 3 completed shifts: In: 811.5 [I.V.:386.5; Blood:310; IV Piggyback:115] Out: 521 [Other:521]  PHYSICAL EXAMINATION: General:  Chronically ill appearing male, NAD  Neuro:  Drowsy but awake, nods appropriately, slow to respond at times, follows commands  HEENT:  Mm moist, no sig JVD, L neck hematoma  Cardiovascular:  s1s2 rrr Lungs: resps even non labored on venti mask, few bibasilar crackles  Abdomen:  Round, soft, slightly distended  Musculoskeletal:  Warm and dry, generalized edema   LABS:  BMET Recent Labs  Lab 01/01/18 1939 01/02/18 0431  NA 129* 129*  K 6.2* 4.3  CL 95* 95*  CO2 17* 18*  BUN 92* 74*  CREATININE 5.98* 5.12*  GLUCOSE 127* 128*    Electrolytes Recent Labs  Lab 01/01/18 1939 01/02/18 0431  CALCIUM 7.3*  7.2*  MG 3.0*  --   PHOS 8.4* 6.7*    CBC Recent Labs  Lab 01/01/18 1939  WBC 8.8  HGB 8.3*  HCT 27.8*  PLT 180    Coag's Recent Labs  Lab 01/01/18 1939  APTT 52*  INR 2.88    Sepsis Markers Recent Labs  Lab 01/01/18 1936 01/01/18 1939 01/02/18 0431  LATICACIDVEN 2.4*  --  1.8  PROCALCITON  --  0.61  --     ABG Recent Labs  Lab 01/01/18 2007 01/02/18 0244  PHART 7.325* 7.338*  PCO2ART 28.2* 27.6*  PO2ART 230.0* 120.0*    Liver Enzymes Recent Labs  Lab 01/01/18 1939 01/02/18 0431  AST 173*  --   ALT 91*  --   ALKPHOS 111  --   BILITOT 2.2*  --   ALBUMIN 2.9* 2.8*    Cardiac Enzymes Recent Labs  Lab 01/01/18 1939 01/02/18 0431  TROPONINI 0.04* 0.03*    Glucose Recent Labs  Lab 01/01/18 2225 01/02/18 0443 01/02/18 0734  GLUCAP 112* 122* 108*    Imaging US Renal  Result Date: 01/01/2018 CLINICAL DATA:  67 year old male with acute renal insufficiency. EXAM: RENAL / URINARY TRACT ULTRASOUND COMPLETE COMPARISON:  None. FINDINGS: Evaluation is limited due to patient's body habitus. Right Kidney: Length: 10 cm. Mild parenchymal atrophy. No hydronephrosis or shadowing stone. Left Kidney: Length: 11 cm. The left kidney is poorly visualized. No definite hydronephrosis. Bladder: Partially  contracted around a Foley catheter. There is ascites with irregular appearance of the liver likely cirrhosis. Clinical correlation is recommended. IMPRESSION: 1. Poorly visualized kidneys secondary to body habitus. No definite hydronephrosis. 2. Findings of cirrhosis as well as a small ascites. Electronically Signed   By: Anner Crete M.D.   On: 01/01/2018 22:50   Dg Chest Port 1 View  Result Date: 01/02/2018 CLINICAL DATA:  Central line placement attempt. EXAM: PORTABLE CHEST 1 VIEW COMPARISON:  Chest radiograph 01/01/2018 FINDINGS: Unchanged position of left chest wall AICD leads. There is a right internal jugular vein approach central venous catheter with tip  in the lower SVC. There is no pneumothorax. Unchanged small pleural effusions with associated atelectasis. Cardiomegaly with remote median sternotomy and CABG. IMPRESSION: No pneumothorax. Unchanged appearance of the chest with small pleural effusions. Electronically Signed   By: Ulyses Jarred M.D.   On: 01/02/2018 02:06   Dg Chest Port 1 View  Result Date: 01/01/2018 CLINICAL DATA:  Acute respiratory failure EXAM: PORTABLE CHEST 1 VIEW COMPARISON:  None. FINDINGS: A right central line terminates in the central SVC. A dual lead AICD device is identified. Mild cardiomegaly. The hila and mediastinum are unremarkable. No pneumothorax. Interstitial opacities on the right greater than left. No focal infiltrate. IMPRESSION: 1. Cardiomegaly. 2. Right greater than left primarily interstitial opacities suggest the possibility of asymmetric edema. Developing pneumonia on the right is not completely excluded. Recommend clinical correlation and attention on follow-up. Electronically Signed   By: Dorise Bullion III M.D   On: 01/01/2018 20:21     STUDIES:  Renal Ultrasound 6/28>>> 1. Poorly visualized kidneys secondary to body habitus. No definite hydronephrosis.  2. Findings of cirrhosis as well as a small ascites.  CULTURES: Blood cultures (6/28) >>> Urine cultures (6/28) >>> Sputum culture (6/28) >>>  ANTIBIOTICS: Vancomycin 6/28>> Zosyn 6/28>>  SIGNIFICANT EVENTS: 6/28: presented to [facility] with c/o fatigue and decreased UOP. Found to be in septic shock with AKI-on-CKD and Hyperkalemia; transferred to [facility]  LINES/TUBES: RIJ TLC [name]) 6/28>>>   DISCUSSION: 67yoM with hx CAD s/p CABG, CHF (EF 25-30%), AICD, DM, Osteomyelitis of Right 1st toe s/p transmetatarsal amputation, Afib (on eliquis), and and CKD Stage 3, presented today to [place] center c/o fatigue and no UOP x 1 day. He was found there to be in septic shock due to UTI +/- Osteo, AKI-on-CKD, and  Hyperkalemia. A Central line was placed. Patient was given 2L IVF bolus and started on broad spectrum antibiotics (Vanc and Zosyn) and vasopressors (Levophed and Dobutamine). He was then transferred to [facility] for continued care and consideration of dialysis  ASSESSMENT / PLAN:  PULMONARY Acute Hypoxic Respiratory Failure - pulse ox remains low on monitor but PO2 230 on ABG on 55% VM Pulmonary edema: PLAN -  F/u CXR  Monitor ABG  Supplemental O2 - suspect PVD and poor perfusion making pulse ox difficult to monitor  Volume removal once BP will tolerate    CARDIOVASCULAR Septic Shock Hx CAD, CHF, Afib PLAN -  Hold home lasix, losartan, coreg, eliquis  Also holding home amiodarone for now - can likely resume in am  Trend lactate, pct  Heparin gtt per pharmacy  Stop dobutamine for now  Echo pending   RENAL AKI-on-Stage 3 CKD Hyperkalemia: UTI - renal u/s ok  Hyperkalemia  Hyponatremia  PLAN -  CRRT per renal  Volume removal once BP will tolerate  F/u chem    GASTROINTESTINAL N/V - resolved PLAN -  PRN zofran    HEMATOLOGIC Anemia: - no s/s bleeding  PLAN -  F/u CBC  Holding home eliquis for now  Heparin gtt per pharmacy    INFECTIOUS Septic Shock UTI  ?osteomyelitis PLAN -  Hold further volume  abx as above  Lactate cleared  F/u pct  Wean pressors as able  Follow cultures   ENDOCRINE DM: SSI   NEUROLOGIC No active issues    FAMILY  - Updates:   No family at bedside on NP rounds 6/29 - updated overnight per notes   - Inter-disciplinary family meet or Palliative Care meeting due by:  Day 7     Nickolas Madrid, NP 01/02/2018  9:27 AM Pager: [phone] or [phone]

## 2018-01-02 NOTE — Procedures (Signed)
Hemodialysis Catheter Insertion Procedure Note Desmin Daleo [id number] 1951/03/05  Procedure: Insertion of Hemodialysis Catheter Indications: Hemodialysis  Procedure Details Consent: Risks of procedure as well as the alternatives and risks of each were explained to the (patient/caregiver).  Consent for procedure obtained.  Time Out: Verified patient identification, verified procedure, site/side was marked, verified correct patient position, special equipment/implants available, medications/allergies/relevent history reviewed, required imaging and test results available.  Performed  Maximum sterile technique was used including antiseptics, cap, gloves, gown, hand hygiene, mask and sheet.  Skin prep: Chlorhexidine; local anesthetic administered  A Trialysis HD catheter was placed in the left femoral vein due to multiple attempts, no other available access using the Seldinger technique.  Evaluation Blood flow good Complications: No apparent complications Patient did tolerate procedure well. Chest X-ray ordered to verify placement.     Procedure performed with ultrasound guidance for real time vessel cannulation.     Hayden Pedro, AG-ACNP [facility]  Pgr: [phone]  PCCM Pgr: [phone]

## 2018-01-02 NOTE — Progress Notes (Addendum)
ANTICOAGULATION CONSULT NOTE - Initial Consult  Pharmacy Consult for heparin Indication: atrial fibrillation  No Known Allergies  Patient Measurements: Weight: 216 lb 11.4 oz (98.3 kg) Heparin Dosing Weight: 91 kg  Vital Signs: Temp: 96.3 F (35.7 C) (06/29 0735) Temp Source: Axillary (06/29 0735) BP: 96/74 (06/29 0933) Pulse Rate: 70 (06/29 0800)  Labs: Recent Labs    01/01/18 1939 01/02/18 0431  HGB 8.3*  --   HCT 27.8*  --   PLT 180  --   APTT 52*  --   LABPROT 30.0*  --   INR 2.88  --   CREATININE 5.98* 5.12*  TROPONINI 0.04* 0.03*     Assessment: Eliquis PTA for AFib - has been held. Transfused 1 unit prbc this morning, as needed to reverse coagulapathy for HD line placement. Pt was transitioned to heparin sq for prophylaxis, last received this morning ~ 0600. Now switching patient to heparin infusion. Will use aPTTs to help titrate until Eliquis is washed out; last dose unknown.   Goal of Therapy:  Heparin level 0.3-0.7 units/ml Monitor platelets by anticoagulation protocol: Yes    Plan:  -Heparin infusion 1250 units/hr -Daily HL, CBC -Check level in 8 hours   Keana Dueitt, Jake Church 01/02/2018,10:00 AM  Addendum -Initial aPTT returned supratherapeutic. I spoke with the RN, it sounds like it was drawn appropriately -Heparin level is high due to Eliquis -Will assume level is accurate, though unusually high  Plan -Reduce heparin to 1050 units/hr -Check another aPTT   Harvel Quale 01/02/2018 7:29 PM

## 2018-01-02 NOTE — Progress Notes (Signed)
  Echocardiogram 2D Echocardiogram has been performed.  Jose Heath 01/02/2018, 9:22 AM

## 2018-01-02 NOTE — Progress Notes (Signed)
[name] NEPHROLOGY PROGRESS NOTE  Assessment/ Plan: Pt is a 67 y.o. yo male  with history of hypertension, diabetes, CAD status post CABG, congestive heart failure with EF of 25 to 30%, AICD, osteomyelitis of right first toe is status post transmetatarsal amputation, A. fib on Eliquis, CKD stage III as per prior note transferred from [facility] for worsening renal failure, septic shock and anuria.  Assessment/Plan:  # Anuric AKI on CKD likely ATN in the setting of septic shock, losartan, Aldactone and Bactrim: -Remains anuric and hypotensive.  Tolerating CRRT well.  Given hypotension I will to net even ultrafiltration today.  Discussed with ICU nurse.  Electrolytes improving.  Will change to 4K dialysis bath. --CKD likely in the setting of CHF, diabetes.  Unknown baseline serum creatinine level. -UA with UTI -Monitor urine output, BMP and watch for renal recovery. -Avoid nephrotoxins.  #Hyperkalemia in the setting of renal failure/ARB/Aldactone and bactrim:  Serum potassium level improved.  Adjusting dialysis potassium bath.  #Hyponatremia likely hypervolemic.  Ma 129. f/u urine lites and osmolality.  #History of CHF: f/u echocardiogram.  Volume optimization.   #Septic shock: Likely due to UTI and osteomyelitis.  Follow-up culture results.  On broad-spectrum antibiotics.  On Levophed and dobutamine currently.  Management per ICU team.  D/w ICU team. Subjective: Seen and examined in ICU.  Patient is alert awake.  Reported weakness.  Denied nausea vomiting chest pain or shortness of breath.  Tolerating CRRT. Objective Vital signs in last 24 hours: Vitals:   01/02/18 0735 01/02/18 0800 01/02/18 0900 01/02/18 0933  BP:  100/72 111/68 96/74  Pulse:  70    Resp:  13 14 11   Temp: (!) 96.3 F (35.7 C)     TempSrc: Axillary     SpO2:  (!) 89% (!) 82% (!) 83%  Weight:       Weight change:   Intake/Output Summary (Last 24 hours) at 01/02/2018 0950 Last data  filed at 01/02/2018 8828 Gross per 24 hour  Intake 969.2 ml  Output 707 ml  Net 262.2 ml       Labs: Basic Metabolic Panel: Recent Labs  Lab 01/01/18 1939 01/02/18 0431  NA 129* 129*  K 6.2* 4.3  CL 95* 95*  CO2 17* 18*  GLUCOSE 127* 128*  BUN 92* 74*  CREATININE 5.98* 5.12*  CALCIUM 7.3* 7.2*  PHOS 8.4* 6.7*   Liver Function Tests: Recent Labs  Lab 01/01/18 1939 01/02/18 0431  AST 173*  --   ALT 91*  --   ALKPHOS 111  --   BILITOT 2.2*  --   PROT 6.1*  --   ALBUMIN 2.9* 2.8*   No results for input(s): LIPASE, AMYLASE in the last 168 hours. No results for input(s): AMMONIA in the last 168 hours. CBC: Recent Labs  Lab 01/01/18 1939  WBC 8.8  NEUTROABS 8.2*  HGB 8.3*  HCT 27.8*  MCV 96.9  PLT 180   Cardiac Enzymes: Recent Labs  Lab 01/01/18 1939 01/02/18 0431  TROPONINI 0.04* 0.03*   CBG: Recent Labs  Lab 01/01/18 2225 01/02/18 0443 01/02/18 0734  GLUCAP 112* 122* 108*    Iron Studies: No results for input(s): IRON, TIBC, TRANSFERRIN, FERRITIN in the last 72 hours. Studies/Results: US Renal  Result Date: 01/01/2018 CLINICAL DATA:  66 year old male with acute renal insufficiency. EXAM: RENAL / URINARY TRACT ULTRASOUND COMPLETE COMPARISON:  None. FINDINGS: Evaluation is limited due to patient's body habitus. Right Kidney: Length: 10 cm. Mild parenchymal atrophy. No  hydronephrosis or shadowing stone. Left Kidney: Length: 11 cm. The left kidney is poorly visualized. No definite hydronephrosis. Bladder: Partially contracted around a Foley catheter. There is ascites with irregular appearance of the liver likely cirrhosis. Clinical correlation is recommended. IMPRESSION: 1. Poorly visualized kidneys secondary to body habitus. No definite hydronephrosis. 2. Findings of cirrhosis as well as a small ascites. Electronically Signed   By: Anner Crete M.D.   On: 01/01/2018 22:50   Dg Chest Port 1 View  Result Date: 01/02/2018 CLINICAL DATA:  Central line  placement attempt. EXAM: PORTABLE CHEST 1 VIEW COMPARISON:  Chest radiograph 01/01/2018 FINDINGS: Unchanged position of left chest wall AICD leads. There is a right internal jugular vein approach central venous catheter with tip in the lower SVC. There is no pneumothorax. Unchanged small pleural effusions with associated atelectasis. Cardiomegaly with remote median sternotomy and CABG. IMPRESSION: No pneumothorax. Unchanged appearance of the chest with small pleural effusions. Electronically Signed   By: Ulyses Jarred M.D.   On: 01/02/2018 02:06   Dg Chest Port 1 View  Result Date: 01/01/2018 CLINICAL DATA:  Acute respiratory failure EXAM: PORTABLE CHEST 1 VIEW COMPARISON:  None. FINDINGS: A right central line terminates in the central SVC. A dual lead AICD device is identified. Mild cardiomegaly. The hila and mediastinum are unremarkable. No pneumothorax. Interstitial opacities on the right greater than left. No focal infiltrate. IMPRESSION: 1. Cardiomegaly. 2. Right greater than left primarily interstitial opacities suggest the possibility of asymmetric edema. Developing pneumonia on the right is not completely excluded. Recommend clinical correlation and attention on follow-up. Electronically Signed   By: Dorise Bullion III M.D   On: 01/01/2018 20:21    Medications: Infusions: . sodium chloride 10 mL/hr at 01/02/18 0000  . DOBUTamine 3 mcg/kg/min (01/02/18 0603)  . famotidine (PEPCID) IV 20 mg (01/02/18 0912)  . heparin    . norepinephrine (LEVOPHED) Adult infusion 10 mcg/min (01/02/18 0820)  . piperacillin-tazobactam 3.375 g (01/02/18 0645)  . dialysis replacement fluid (prismasate) 500 mL/hr at 01/02/18 0230  . dialysis replacement fluid (prismasate) 250 mL/hr at 01/02/18 0231  . dialysate (PRISMASATE) 1,500 mL/hr at 01/02/18 0915  . vancomycin      Scheduled Medications: . Chlorhexidine Gluconate Cloth  6 each Topical Daily  . heparin  5,000 Units Subcutaneous Q8H  . insulin aspart   2-6 Units Subcutaneous Q4H  . mouth rinse  15 mL Mouth Rinse BID  . sodium chloride flush  10-40 mL Intracatheter Q12H    have reviewed scheduled and prn medications.  Physical Exam: General:NAD, comfortable, on Venturi mask Heart:RRR, s1s2 nl Lungs: Coarse breath sound bilateral Abdomen:soft, Non-tender, non-distended Extremities: Trace lower extremity edema Dialysis Access: left femoral catheter   Lataisha Colan Prasad Raynaldo Falco 01/02/2018,9:50 AM  LOS: 1 day

## 2018-01-03 ENCOUNTER — Inpatient Hospital Stay (HOSPITAL_COMMUNITY): Payer: Medicare HMO

## 2018-01-03 DIAGNOSIS — J9601 Acute respiratory failure with hypoxia: Secondary | ICD-10-CM

## 2018-01-03 LAB — GLUCOSE, CAPILLARY
GLUCOSE-CAPILLARY: 96 mg/dL (ref 70–99)
GLUCOSE-CAPILLARY: 112 mg/dL — AB (ref 70–99)
GLUCOSE-CAPILLARY: 129 mg/dL — AB (ref 70–99)
Glucose-Capillary: 102 mg/dL — ABNORMAL HIGH (ref 70–99)
Glucose-Capillary: 83 mg/dL (ref 70–99)
Glucose-Capillary: 94 mg/dL (ref 70–99)

## 2018-01-03 LAB — CBC
HEMATOCRIT: 24.3 % — AB (ref 39.0–52.0)
HEMATOCRIT: 24.5 % — AB (ref 39.0–52.0)
HEMOGLOBIN: 7.5 g/dL — AB (ref 13.0–17.0)
Hemoglobin: 7.3 g/dL — ABNORMAL LOW (ref 13.0–17.0)
MCH: 28.7 pg (ref 26.0–34.0)
MCH: 29.3 pg (ref 26.0–34.0)
MCHC: 30 g/dL (ref 30.0–36.0)
MCHC: 30.6 g/dL (ref 30.0–36.0)
MCV: 95.7 fL (ref 78.0–100.0)
MCV: 95.7 fL (ref 78.0–100.0)
Platelets: 105 10*3/uL — ABNORMAL LOW (ref 150–400)
Platelets: 104 10*3/uL — ABNORMAL LOW (ref 150–400)
RBC: 2.54 MIL/uL — ABNORMAL LOW (ref 4.22–5.81)
RBC: 2.56 MIL/uL — ABNORMAL LOW (ref 4.22–5.81)
RDW: 21.5 % — AB (ref 11.5–15.5)
RDW: 21.2 % — AB (ref 11.5–15.5)
WBC: 6.8 10*3/uL (ref 4.0–10.5)
WBC: 6.8 10*3/uL (ref 4.0–10.5)

## 2018-01-03 LAB — COOXEMETRY PANEL
Carboxyhemoglobin: 2.1 % — ABNORMAL HIGH (ref 0.5–1.5)
METHEMOGLOBIN: 1 % (ref 0.0–1.5)
O2 Saturation: 53.7 %
TOTAL HEMOGLOBIN: 7.7 g/dL — AB (ref 12.0–16.0)

## 2018-01-03 LAB — RENAL FUNCTION PANEL
ALBUMIN: 2.6 g/dL — AB (ref 3.5–5.0)
ANION GAP: 12 (ref 5–15)
Albumin: 2.7 g/dL — ABNORMAL LOW (ref 3.5–5.0)
Anion gap: 11 (ref 5–15)
BUN: 35 mg/dL — AB (ref 8–23)
BUN: 26 mg/dL — ABNORMAL HIGH (ref 8–23)
CALCIUM: 7.3 mg/dL — AB (ref 8.9–10.3)
CHLORIDE: 98 mmol/L (ref 98–111)
CO2: 24 mmol/L (ref 22–32)
CO2: 23 mmol/L (ref 22–32)
CREATININE: 2.9 mg/dL — AB (ref 0.61–1.24)
Calcium: 7.3 mg/dL — ABNORMAL LOW (ref 8.9–10.3)
Chloride: 96 mmol/L — ABNORMAL LOW (ref 98–111)
Creatinine, Ser: 2.35 mg/dL — ABNORMAL HIGH (ref 0.61–1.24)
GFR calc Af Amer: 32 mL/min — ABNORMAL LOW (ref >60)
GFR calc non Af Amer: 27 mL/min — ABNORMAL LOW (ref >60)
GFR, EST AFRICAN AMERICAN: 24 mL/min — AB (ref >60)
GFR, EST NON AFRICAN AMERICAN: 21 mL/min — AB (ref >60)
GLUCOSE: 130 mg/dL — AB (ref 70–99)
Glucose, Bld: 102 mg/dL — ABNORMAL HIGH (ref 70–99)
PHOSPHORUS: 3 mg/dL (ref 2.5–4.6)
POTASSIUM: 3.4 mmol/L — AB (ref 3.5–5.1)
Phosphorus: 3.6 mg/dL (ref 2.5–4.6)
Potassium: 3.6 mmol/L (ref 3.5–5.1)
SODIUM: 133 mmol/L — AB (ref 135–145)
Sodium: 131 mmol/L — ABNORMAL LOW (ref 135–145)

## 2018-01-03 LAB — POCT I-STAT, CHEM 8
BUN: 23 mg/dL (ref 8–23)
CALCIUM ION: 1.05 mmol/L — AB (ref 1.15–1.40)
CHLORIDE: 96 mmol/L — AB (ref 98–111)
CREATININE: 1.9 mg/dL — AB (ref 0.61–1.24)
GLUCOSE: 101 mg/dL — AB (ref 70–99)
HCT: 25 % — ABNORMAL LOW (ref 39.0–52.0)
Hemoglobin: 8.5 g/dL — ABNORMAL LOW (ref 13.0–17.0)
Potassium: 3.4 mmol/L — ABNORMAL LOW (ref 3.5–5.1)
Sodium: 135 mmol/L (ref 135–145)
TCO2: 25 mmol/L (ref 22–32)

## 2018-01-03 LAB — HEPARIN LEVEL (UNFRACTIONATED)

## 2018-01-03 LAB — APTT: aPTT: >200 seconds (ref 24–36)

## 2018-01-03 MED ORDER — POTASSIUM CHLORIDE 10 MEQ/50ML IV SOLN
10.0000 meq | INTRAVENOUS | Status: AC
  Administered 2018-01-03 (×3): 10 meq via INTRAVENOUS
  Filled 2018-01-03 (×3): qty 50

## 2018-01-03 MED ORDER — PRISMASOL BGK 4/2.5 32-4-2.5 MEQ/L IV SOLN
INTRAVENOUS | Status: DC
  Administered 2018-01-03 – 2018-01-07 (×6): via INTRAVENOUS_CENTRAL
  Filled 2018-01-03 (×7): qty 5000

## 2018-01-03 MED ORDER — COLLAGENASE 250 UNIT/GM EX OINT
TOPICAL_OINTMENT | Freq: Every day | CUTANEOUS | Status: DC
  Administered 2018-01-03 – 2018-01-04 (×2): via TOPICAL
  Filled 2018-01-03: qty 30

## 2018-01-03 MED ORDER — PRISMASOL BGK 4/2.5 32-4-2.5 MEQ/L IV SOLN
INTRAVENOUS | Status: DC
  Administered 2018-01-03 – 2018-01-08 (×12): via INTRAVENOUS_CENTRAL
  Filled 2018-01-03 (×13): qty 5000

## 2018-01-03 NOTE — Progress Notes (Signed)
PULMONARY / CRITICAL CARE MEDICINE   Name: Jose Heath MRN: [id number] DOB: [birth year]    ADMISSION DATE:  01/01/2018 CONSULTATION DATE: 01/01/18  REFERRING MD: Transfer from [place]: Septic shock  HISTORY OF PRESENT ILLNESS:   [phone] with hx CAD s/p CABG, CHF (EF 25-30%), AICD, DM, Osteomyelitis of Right 1st toe s/p transmetatarsal amputation, Afib (on eliquis), and and CKD Stage 3, presented today to [place] center c/o fatigue and no UOP x 1 day. He was found there to be in septic shock due to UTI +/- Osteo, AKI-on-CKD, and Hyperkalemia. A Central line was placed. Patient was given 2L IVF bolus and started on broad spectrum antibiotics (Vanc and Zosyn) and vasopressors (Levophed and Dobutamine). He was then transferred to [facility] for continued care and consideration of dialysis.    SUBJECTIVE:  No acute change overnight.  Feels better.  Removing volume with CRRT  No c/o.  Eating breakfast   VITAL SIGNS: BP 97/65   Pulse 70   Temp 97.6 F (36.4 C) (Oral)   Resp 18   Ht 5\' 9"  (1.753 m)   Wt 96.6 kg (212 lb 15.4 oz)   SpO2 (!) 85%   BMI 31.45 kg/m   HEMODYNAMICS: CVP:  [19 mmHg-28 mmHg] 26 mmHg  VENTILATOR SETTINGS: FiO2 (%):  [100 %] 100 %  INTAKE / OUTPUT: I/O last 3 completed shifts: In: 2539.7 [P.O.:450; I.V.:1214.7; Blood:310; IV Piggyback:565] Out: 0102 [Urine:205; Other:4620]  PHYSICAL EXAMINATION: General: pleasant, chronically ill appearing male, NAD  Neuro: awake, alert, slightly confused intermittently but mostly appropriate, MAE HEENT:  Mm moist, no JVD, L neck hematoma resolved, some residual bruising  Cardiovascular:  s1s2 rrr Lungs: resps even non labored on [place], few scattered rhonchi  Abdomen:  Tight, slightly distended, non tender  Musculoskeletal:  Generalized edema   LABS:  BMET Recent Labs  Lab 01/02/18 0431 01/02/18 1024 01/03/18 0422  NA 129* 130* 133*  K 4.3 3.7 3.6  CL 95* 95* 98  CO2  18* 22 24  BUN 74* 60* 35*  CREATININE 5.12* 4.22* 2.90*  GLUCOSE 128* 80 102*    Electrolytes Recent Labs  Lab 01/01/18 1939 01/02/18 0431 01/02/18 1024 01/03/18 0422  CALCIUM 7.3* 7.2* 7.0* 7.3*  MG 3.0*  --  2.6*  --   PHOS 8.4* 6.7* 5.4* 3.6    CBC Recent Labs  Lab 01/01/18 1939 01/02/18 1024 01/03/18 0422  WBC 8.8 7.7 6.8  HGB 8.3* 7.7* 7.3*  HCT 27.8* 25.5* 24.3*  PLT 180 169 105*    Coag's Recent Labs  Lab 01/01/18 1939 01/02/18 1719 01/02/18 1935 01/03/18 0245  APTT 52* >200* >200* >200*  INR 2.88  --   --   --     Sepsis Markers Recent Labs  Lab 01/01/18 1936 01/01/18 1939 01/02/18 0431 01/02/18 1024  LATICACIDVEN 2.4*  --  1.8  --   PROCALCITON  --  0.61  --  0.72    ABG Recent Labs  Lab 01/01/18 2007 01/02/18 0244  PHART 7.325* 7.338*  PCO2ART 28.2* 27.6*  PO2ART 230.0* 120.0*    Liver Enzymes Recent Labs  Lab 01/01/18 1939 01/02/18 0431 01/03/18 0422  AST 173*  --   --   ALT 91*  --   --   ALKPHOS 111  --   --   BILITOT 2.2*  --   --   ALBUMIN 2.9* 2.8* 2.7*    Cardiac Enzymes Recent Labs  Lab 01/01/18 1939 01/02/18  0431 01/02/18 1024  TROPONINI 0.04* 0.03* 0.03*    Glucose Recent Labs  Lab 01/02/18 1108 01/02/18 1516 01/02/18 1943 01/02/18 2345 01/03/18 0347 01/03/18 0721  GLUCAP 73 90 88 82 83 96    Imaging Dg Chest Portable 1 View  Result Date: 01/03/2018 CLINICAL DATA:  Pulmonary edema EXAM: PORTABLE CHEST 1 VIEW COMPARISON:  Chest radiograph 01/02/2018 FINDINGS: Right IJ central venous catheter tip projects over superior vena cava. AST leads project over the heart, unchanged. Stable cardiomegaly. Low lung volumes. Similar-appearing diffuse bilateral interstitial pulmonary opacities with more patchy consolidation within the right mid lower lung. Moderate right and small left pleural effusions. No pneumothorax. Remote right rib fracture. IMPRESSION: Interval increase in right mid lower lung consolidation  with persistent bilateral interstitial opacities suggestive of worsening pulmonary edema. Moderate right and small left pleural effusions. Electronically Signed   By: Lovey Newcomer M.D.   On: 01/03/2018 07:13     STUDIES:  Renal Ultrasound 6/28>>> 1. Poorly visualized kidneys secondary to body habitus. No definite hydronephrosis.  2. Findings of cirrhosis as well as a small ascites. 2D echo 6/29>>> EF 20-25%, diffuse hypokinesis, moderate TR, severely reduced systolic function   CULTURES: Blood cultures (6/28) >>> Urine cultures (6/28) >>> Sputum culture (6/28) >>>  ANTIBIOTICS: Vancomycin 6/28>> Zosyn 6/28>>  SIGNIFICANT EVENTS: 6/28: presented to [facility] with c/o fatigue and decreased UOP. Found to be in septic shock with AKI-on-CKD and Hyperkalemia; transferred to [facility]  LINES/TUBES: RIJ TLC [name]) 6/28>>>   DISCUSSION: 13yoM with hx CAD s/p CABG, CHF (EF 25-30%), AICD, DM, Osteomyelitis of Right 1st toe s/p transmetatarsal amputation, Afib (on eliquis), and and CKD Stage 3, presented today to [place] center c/o fatigue and no UOP x 1 day. He was found there to be in septic shock due to UTI +/- Osteo, AKI-on-CKD, and Hyperkalemia. A Central line was placed. Patient was given 2L IVF bolus and started on broad spectrum antibiotics (Vanc and Zosyn) and vasopressors (Levophed and Dobutamine). He was then transferred to [facility] for continued care and consideration of dialysis  ASSESSMENT / PLAN:  PULMONARY Acute Hypoxic Respiratory Failure r/t decompensated biventricular heart failure with pulmonary edema: PLAN -  Volume removal as below  F/u CXR  Supplemental O2 as needed  Pulmonary hygiene  Inotrope support as below   Consider heart failure team involvement  Check co-ox   CARDIOVASCULAR Shock - septic +/- cardiogenic  Acute decompensated biventricular heart failure  Mod/severe TR  Hx CAD, CHF, Afib PLAN -  Continue to hold home lasix,  losartan, coreg, eliquis  Continue to hold amiodarone  Trend lactate, pct  Heparin gtt per pharmacy  Continue dobutamine  Wean levophed as able  Volume removal with CRRT as below   RENAL AKI-on-Stage 3 CKD Hyperkalemia: UTI - renal u/s ok  Hyperkalemia  Hyponatremia  PLAN -  CRRT per renal  Volume removal as able  F/u chem  Unclear if his renal function will improve or if this insult will push him into ESRD  May need perm cath this admit if no sig renal recovery    GASTROINTESTINAL N/V - resolved PLAN -  PRN zofran  PO diet   HEMATOLOGIC Anemia: - no s/s bleeding  PLAN -  F/u CBC  Holding home eliquis for now  Heparin gtt per pharmacy for AFib    INFECTIOUS sepsis UTI  ?osteomyelitis PLAN -  abx as above  Lactate cleared  F/u pct  Wean pressors as able  Follow  cultures   ENDOCRINE DM: SSI   NEUROLOGIC No active issues   FAMILY  - Updates:   No family at bedside on NP rounds 6/30  - Inter-disciplinary family meet or Palliative Care meeting due by:  Day 7     Nickolas Madrid, NP 01/03/2018  9:29 AM Pager: [phone] or [phone]

## 2018-01-03 NOTE — Progress Notes (Signed)
[place] for heparin Indication: atrial fibrillation  No Known Allergies  Patient Measurements: Height: 5\' 9"  (175.3 cm) Weight: 212 lb 15.4 oz (96.6 kg) IBW/kg (Calculated) : 70.7 Heparin Dosing Weight: 91 kg  Vital Signs: Temp: 98 F (36.7 C) (06/30 0349) Temp Source: Axillary (06/30 0349) BP: 95/52 (06/30 0600)  Labs: Recent Labs    01/01/18 1939 01/02/18 0431 01/02/18 1024 01/02/18 1719 01/02/18 1935 01/03/18 0245 01/03/18 0422  HGB 8.3*  --  7.7*  --   --   --  7.3*  HCT 27.8*  --  25.5*  --   --   --  24.3*  PLT 180  --  169  --   --   --  105*  APTT 52*  --   --  >200* >200* >200*  --   LABPROT 30.0*  --   --   --   --   --   --   INR 2.88  --   --   --   --   --   --   HEPARINUNFRC  --   --  >2.20* >2.20*  --  >2.20*  --   CREATININE 5.98* 5.12* 4.22*  --   --   --  2.90*  TROPONINI 0.04* 0.03* 0.03*  --   --   --   --      Assessment: Eliquis PTA for AFib - has been held. Transfused 1 unit prbc this morning, as needed to reverse coagulapathy for HD line placement. Pt was transitioned to heparin sq for prophylaxis, last received this morning ~ 0600. Now switching patient to heparin infusion. Will use aPTTs to help titrate until Eliquis is washed out; last dose unknown. APTT reported as >200 sec. anti Xa >2.2.  Heparin was held around 4:20 this am  Goal of Therapy:  Heparin level 0.3-0.7 units/ml Monitor platelets by anticoagulation protocol: Yes   Plan:  -Heparin held for ~ 2 hours so will restart now at 700 units/hr Check heparin level and aPTT 6 hours after restarted  Michalle Rademaker Poteet 01/03/2018,6:22 AM

## 2018-01-03 NOTE — Progress Notes (Signed)
[facility] NEPHROLOGY PROGRESS NOTE  Assessment/ Plan: Pt is a 67 y.o. yo male  with history of hypertension, diabetes, CAD status post CABG, congestive heart failure with EF of 25 to 30%, AICD, osteomyelitis of right first toe is status post transmetatarsal amputation, A. fib on Eliquis, CKD stage III as per prior note transferred from [facility] for worsening renal failure, septic shock and anuria.  Assessment/Plan:  # Anuric AKI on CKD likely ATN in the setting of septic shock/losartan/ Aldactone/Bactrim and biventricular CHF. CKD likely in the setting of CHF, diabetes.  Unknown baseline serum creatinine level. -Remains oliguric and hypotensive.  Tolerating CRRT well, taking off fluid around 200 cc/hr. Change to 4 K bath.  -UA with UTI -Monitor urine output, BMP and watch for renal recovery. -Avoid nephrotoxins.  #Hyperkalemia in the setting of renal failure/ARB/Aldactone and bactrim:  Serum potassium level improved.  Adjusting dialysis potassium bath.  #Hyponatremia, hypervolemic.  Na 133.  #Biventricular CHF with fluid overload: EF of 20%.  Chest x-ray with pulmonary edema.  On CRRT for fluid removal.  #Septic shock: Likely due to UTI and osteomyelitis.  Follow-up culture results.  On broad-spectrum antibiotics.  On Levophed and dobutamine currently.  Management per ICU team.  D/w ICU team.  Subjective: Seen and examined in ICU.  Looks more alert today.  Denies chest pain or shortness of breath.  No nausea or vomiting.  Objective Vital signs in last 24 hours: Vitals:   01/03/18 0700 01/03/18 0800 01/03/18 0900 01/03/18 1000  BP: (!) 89/53 105/63 97/65 (!) 119/99  Pulse:  70    Resp: 16 14 18 15   Temp: 97.6 F (36.4 C)     TempSrc: Oral     SpO2:  91% (!) 85% 95%  Weight:      Height:       Weight change: -2.3 kg (-5 lb 1.1 oz)  Intake/Output Summary (Last 24 hours) at 01/03/2018 1102 Last data filed at 01/03/2018 1100 Gross per 24 hour  Intake  1870 ml  Output 4544 ml  Net -2674 ml       Labs: Basic Metabolic Panel: Recent Labs  Lab 01/02/18 0431 01/02/18 1024 01/03/18 0422  NA 129* 130* 133*  K 4.3 3.7 3.6  CL 95* 95* 98  CO2 18* 22 24  GLUCOSE 128* 80 102*  BUN 74* 60* 35*  CREATININE 5.12* 4.22* 2.90*  CALCIUM 7.2* 7.0* 7.3*  PHOS 6.7* 5.4* 3.6   Liver Function Tests: Recent Labs  Lab 01/01/18 1939 01/02/18 0431 01/03/18 0422  AST 173*  --   --   ALT 91*  --   --   ALKPHOS 111  --   --   BILITOT 2.2*  --   --   PROT 6.1*  --   --   ALBUMIN 2.9* 2.8* 2.7*   No results for input(s): LIPASE, AMYLASE in the last 168 hours. No results for input(s): AMMONIA in the last 168 hours. CBC: Recent Labs  Lab 01/01/18 1939 01/02/18 1024 01/03/18 0422  WBC 8.8 7.7 6.8  NEUTROABS 8.2*  --   --   HGB 8.3* 7.7* 7.3*  HCT 27.8* 25.5* 24.3*  MCV 96.9 95.9 95.7  PLT 180 169 105*   Cardiac Enzymes: Recent Labs  Lab 01/01/18 1939 01/02/18 0431 01/02/18 1024  TROPONINI 0.04* 0.03* 0.03*   CBG: Recent Labs  Lab 01/02/18 1516 01/02/18 1943 01/02/18 2345 01/03/18 0347 01/03/18 0721  GLUCAP 90 88 82 83 96  Iron Studies: No results for input(s): IRON, TIBC, TRANSFERRIN, FERRITIN in the last 72 hours. Studies/Results: US Renal  Result Date: 01/01/2018 CLINICAL DATA:  67 year old male with acute renal insufficiency. EXAM: RENAL / URINARY TRACT ULTRASOUND COMPLETE COMPARISON:  None. FINDINGS: Evaluation is limited due to patient's body habitus. Right Kidney: Length: 10 cm. Mild parenchymal atrophy. No hydronephrosis or shadowing stone. Left Kidney: Length: 11 cm. The left kidney is poorly visualized. No definite hydronephrosis. Bladder: Partially contracted around a Foley catheter. There is ascites with irregular appearance of the liver likely cirrhosis. Clinical correlation is recommended. IMPRESSION: 1. Poorly visualized kidneys secondary to body habitus. No definite hydronephrosis. 2. Findings of  cirrhosis as well as a small ascites. Electronically Signed   By: Anner Crete M.D.   On: 01/01/2018 22:50   Dg Chest Portable 1 View  Result Date: 01/03/2018 CLINICAL DATA:  Pulmonary edema EXAM: PORTABLE CHEST 1 VIEW COMPARISON:  Chest radiograph 01/02/2018 FINDINGS: Right IJ central venous catheter tip projects over superior vena cava. AST leads project over the heart, unchanged. Stable cardiomegaly. Low lung volumes. Similar-appearing diffuse bilateral interstitial pulmonary opacities with more patchy consolidation within the right mid lower lung. Moderate right and small left pleural effusions. No pneumothorax. Remote right rib fracture. IMPRESSION: Interval increase in right mid lower lung consolidation with persistent bilateral interstitial opacities suggestive of worsening pulmonary edema. Moderate right and small left pleural effusions. Electronically Signed   By: Lovey Newcomer M.D.   On: 01/03/2018 07:13   Dg Chest Port 1 View  Result Date: 01/02/2018 CLINICAL DATA:  Central line placement attempt. EXAM: PORTABLE CHEST 1 VIEW COMPARISON:  Chest radiograph 01/01/2018 FINDINGS: Unchanged position of left chest wall AICD leads. There is a right internal jugular vein approach central venous catheter with tip in the lower SVC. There is no pneumothorax. Unchanged small pleural effusions with associated atelectasis. Cardiomegaly with remote median sternotomy and CABG. IMPRESSION: No pneumothorax. Unchanged appearance of the chest with small pleural effusions. Electronically Signed   By: Ulyses Jarred M.D.   On: 01/02/2018 02:06   Dg Chest Port 1 View  Result Date: 01/01/2018 CLINICAL DATA:  Acute respiratory failure EXAM: PORTABLE CHEST 1 VIEW COMPARISON:  None. FINDINGS: A right central line terminates in the central SVC. A dual lead AICD device is identified. Mild cardiomegaly. The hila and mediastinum are unremarkable. No pneumothorax. Interstitial opacities on the right greater than left. No  focal infiltrate. IMPRESSION: 1. Cardiomegaly. 2. Right greater than left primarily interstitial opacities suggest the possibility of asymmetric edema. Developing pneumonia on the right is not completely excluded. Recommend clinical correlation and attention on follow-up. Electronically Signed   By: Dorise Bullion III M.D   On: 01/01/2018 20:21    Medications: Infusions: . sodium chloride 10 mL/hr at 01/02/18 0000  . DOBUTamine 7.5 mcg/kg/min (01/03/18 0200)  . famotidine (PEPCID) IV Stopped (01/03/18 0258)  . heparin    . norepinephrine (LEVOPHED) Adult infusion 2 mcg/min (01/03/18 1020)  . piperacillin-tazobactam 3.375 g (01/03/18 0609)  . dialysis replacement fluid (prismasate) 500 mL/hr at 01/03/18 0812  . dialysis replacement fluid (prismasate) 250 mL/hr at 01/02/18 2234  . dialysate (PRISMASATE) 1,500 mL/hr at 01/03/18 0812  . vancomycin Stopped (01/02/18 1403)    Scheduled Medications: . Chlorhexidine Gluconate Cloth  6 each Topical Daily  . insulin aspart  2-6 Units Subcutaneous Q4H  . mouth rinse  15 mL Mouth Rinse BID  . sodium chloride flush  10-40 mL Intracatheter Q12H    have reviewed  scheduled and prn medications.  Physical Exam: General: Not in distress, lying in bed Heart:RRR, s1s2 nl, no rubs Lungs: Coarse breath sound bilateral, no wheezing Abdomen:soft, Non-tender, non-distended Extremities: Trace lower extremity edema Dialysis Access: left femoral catheter   Mahati Vajda Tanna Furry 01/03/2018,11:02 AM  LOS: 2 days

## 2018-01-03 NOTE — Consult Note (Signed)
[facility] Nurse wound consult note Reason for Consult: right GT full thickness wound following amputation, coccygeal Unstageable Pressure Injury Wound type: Surgical, pressure Pressure Injury POA: Yes/No/NA Measurement: Right medial foot:  5cm x 3.4cm x 0.2cmwith central defect measuring 2cm x 1.2cm x 1cm with undermining of this central area measuring 1cm from 12-4 o'clock. Red wound bed with small amount of serosanguinous drainage that is firmly adherent to old dressing. Coccygeal Unstageable Pressure injury: 3.5cm x 2.5cm with complete depth obscured by the presence of necrotic tissue.  1cm undermining noted from 2-6 o'clock. Friable wound periphery/edge. Small amount of drainage (serosanguinous), 0.5cm loose necrotic tissue hanging/protruding from most distal portion of this full thickness wound. Wound bed:As described above Drainage (amount, consistency, odor) As described above Periwound: intact Dressing procedure/placement/frequency: I will implement a conservative POC to the RGT amputation site wound for in house, he will follow up with podiatry post discharge for this wound. The coccygeal Unstageable pressure injury will be treated with once daily collagenase. If a more prompt conversion of this chronic wound to an acute wound, CCS could be consulted for sharp/surgical debridement and/or consideration for hydrotherapy.  If you agree, please order/arrange for CS consultation. Bilateral pressure redistribution heel boots are provided for in house and a pressure redistributing chair pad is provided for his use when OOB. Patient to turn from side to side while in bed here and minimize time spent in the supine position. HOB to be at or below a 30 degree angle.  [facility] nursing team will not follow, but will remain available to this patient, the nursing and medical teams.  Please re-consult if needed. Thanks, Maudie Flakes, MSN, RN, [place], Arther Abbott  Pager# [phone]

## 2018-01-04 DIAGNOSIS — R570 Cardiogenic shock: Secondary | ICD-10-CM

## 2018-01-04 LAB — TSH: TSH: 15.094 u[IU]/mL — ABNORMAL HIGH (ref 0.350–4.500)

## 2018-01-04 LAB — RENAL FUNCTION PANEL
ALBUMIN: 2.9 g/dL — AB (ref 3.5–5.0)
ANION GAP: 13 (ref 5–15)
Albumin: 2.7 g/dL — ABNORMAL LOW (ref 3.5–5.0)
Anion gap: 13 (ref 5–15)
BUN: 18 mg/dL (ref 8–23)
BUN: 15 mg/dL (ref 8–23)
CO2: 24 mmol/L (ref 22–32)
CO2: 22 mmol/L (ref 22–32)
CREATININE: 1.9 mg/dL — AB (ref 0.61–1.24)
Calcium: 7.6 mg/dL — ABNORMAL LOW (ref 8.9–10.3)
Calcium: 7.4 mg/dL — ABNORMAL LOW (ref 8.9–10.3)
Chloride: 96 mmol/L — ABNORMAL LOW (ref 98–111)
Chloride: 97 mmol/L — ABNORMAL LOW (ref 98–111)
Creatinine, Ser: 1.77 mg/dL — ABNORMAL HIGH (ref 0.61–1.24)
GFR calc Af Amer: 44 mL/min — ABNORMAL LOW (ref >60)
GFR calc non Af Amer: 35 mL/min — ABNORMAL LOW (ref >60)
GFR, EST AFRICAN AMERICAN: 41 mL/min — AB (ref >60)
GFR, EST NON AFRICAN AMERICAN: 38 mL/min — AB (ref >60)
Glucose, Bld: 105 mg/dL — ABNORMAL HIGH (ref 70–99)
Glucose, Bld: 157 mg/dL — ABNORMAL HIGH (ref 70–99)
PHOSPHORUS: 3.2 mg/dL (ref 2.5–4.6)
POTASSIUM: 3.7 mmol/L (ref 3.5–5.1)
POTASSIUM: 3.7 mmol/L (ref 3.5–5.1)
Phosphorus: 1.9 mg/dL — ABNORMAL LOW (ref 2.5–4.6)
Sodium: 133 mmol/L — ABNORMAL LOW (ref 135–145)
Sodium: 132 mmol/L — ABNORMAL LOW (ref 135–145)

## 2018-01-04 LAB — GLUCOSE, CAPILLARY
GLUCOSE-CAPILLARY: 90 mg/dL (ref 70–99)
GLUCOSE-CAPILLARY: 102 mg/dL — AB (ref 70–99)
Glucose-Capillary: 107 mg/dL — ABNORMAL HIGH (ref 70–99)
Glucose-Capillary: 104 mg/dL — ABNORMAL HIGH (ref 70–99)
Glucose-Capillary: 138 mg/dL — ABNORMAL HIGH (ref 70–99)
Glucose-Capillary: 125 mg/dL — ABNORMAL HIGH (ref 70–99)

## 2018-01-04 LAB — BLOOD GAS, ARTERIAL
ACID-BASE DEFICIT: 0.3 mmol/L (ref 0.0–2.0)
Bicarbonate: 23.2 mmol/L (ref 20.0–28.0)
DRAWN BY: 313941
O2 CONTENT: 2 L/min
O2 Saturation: 97.7 %
PCO2 ART: 32.7 mmHg (ref 32.0–48.0)
Patient temperature: 97.3
pH, Arterial: 7.461 — ABNORMAL HIGH (ref 7.350–7.450)
pO2, Arterial: 85.5 mmHg (ref 83.0–108.0)

## 2018-01-04 LAB — CBC
HEMATOCRIT: 24.7 % — AB (ref 39.0–52.0)
HEMOGLOBIN: 7.5 g/dL — AB (ref 13.0–17.0)
MCH: 29.2 pg (ref 26.0–34.0)
MCHC: 30.4 g/dL (ref 30.0–36.0)
MCV: 96.1 fL (ref 78.0–100.0)
Platelets: 103 10*3/uL — ABNORMAL LOW (ref 150–400)
RBC: 2.57 MIL/uL — ABNORMAL LOW (ref 4.22–5.81)
RDW: 21.5 % — ABNORMAL HIGH (ref 11.5–15.5)
WBC: 7.1 10*3/uL (ref 4.0–10.5)

## 2018-01-04 LAB — MAGNESIUM: MAGNESIUM: 2.3 mg/dL (ref 1.7–2.4)

## 2018-01-04 LAB — COOXEMETRY PANEL
CARBOXYHEMOGLOBIN: 1.6 % — AB (ref 0.5–1.5)
Methemoglobin: 1.7 % — ABNORMAL HIGH (ref 0.0–1.5)
O2 SAT: 37.5 %
TOTAL HEMOGLOBIN: 7.5 g/dL — AB (ref 12.0–16.0)

## 2018-01-04 LAB — PROCALCITONIN: Procalcitonin: 0.64 ng/mL

## 2018-01-04 MED ORDER — BISACODYL 10 MG RE SUPP
10.0000 mg | Freq: Once | RECTAL | Status: AC
  Administered 2018-01-04: 10 mg via RECTAL
  Filled 2018-01-04: qty 1

## 2018-01-04 MED ORDER — SODIUM PHOSPHATES 45 MMOLE/15ML IV SOLN
30.0000 mmol | Freq: Once | INTRAVENOUS | Status: AC
  Administered 2018-01-04: 30 mmol via INTRAVENOUS
  Filled 2018-01-04: qty 10

## 2018-01-04 MED ORDER — DOCUSATE SODIUM 100 MG PO CAPS
100.0000 mg | ORAL_CAPSULE | Freq: Two times a day (BID) | ORAL | Status: DC
  Administered 2018-01-04 – 2018-01-12 (×7): 100 mg via ORAL
  Filled 2018-01-04 (×17): qty 1

## 2018-01-04 MED ORDER — LEVOTHYROXINE SODIUM 100 MCG IV SOLR
50.0000 ug | Freq: Every day | INTRAVENOUS | Status: DC
  Administered 2018-01-04 – 2018-01-05 (×2): 50 ug via INTRAVENOUS
  Filled 2018-01-04 (×3): qty 5

## 2018-01-04 MED ORDER — COLLAGENASE 250 UNIT/GM EX OINT
TOPICAL_OINTMENT | Freq: Two times a day (BID) | CUTANEOUS | Status: DC
  Administered 2018-01-05 – 2018-01-07 (×7): via TOPICAL
  Administered 2018-01-08: 1 via TOPICAL
  Administered 2018-01-08 – 2018-01-09 (×2): via TOPICAL
  Administered 2018-01-09: 1 via TOPICAL
  Administered 2018-01-10 – 2018-01-21 (×22): via TOPICAL
  Filled 2018-01-04 (×3): qty 30

## 2018-01-04 NOTE — Progress Notes (Signed)
Pharmacy Antibiotic Note  Jose Heath is a 67 y.o. male admitted on 01/01/2018 with sepsis from UTI / ?osteomyelitis.  Pharmacy managing Zosyn and vancomycin dosing. On D#3 of abx. WBC wnl, PCt 0.64. Afebrile. Continue on CRRT   Plan: -Continue vancomycin 1 gm IV Q 24 hours  -Continue Zosyn 3.375 gm IV Q 6 hours  -Monitor CBC, renal fx, cultures and clinical progress  -VT at SS   Height: 5\' 9"  (175.3 cm) Weight: 196 lb 13.9 oz (89.3 kg) IBW/kg (Calculated) : 70.7  Temp (24hrs), Avg:97.5 F (36.4 C), Min:97.3 F (36.3 C), Max:98.2 F (36.8 C)  Recent Labs  Lab 01/01/18 1936  01/01/18 1939 01/02/18 0431 01/02/18 1024 01/03/18 0422 01/03/18 1541 01/03/18 2035 01/04/18 0410  WBC  --   --  8.8  --  7.7 6.8 6.8  --  7.1  CREATININE  --    < > 5.98* 5.12* 4.22* 2.90* 2.35* 1.90* 1.90*  LATICACIDVEN 2.4*  --   --  1.8  --   --   --   --   --    < > = values in this interval not displayed.    Estimated Creatinine Clearance: 42.2 mL/min (A) (by C-G formula based on SCr of 1.9 mg/dL (H)).    No Known Allergies  Ceftriaxone 6/28 x 1 Vancomycin 6/28 >> Zosyn 6/28 >>  Blood x 2 [place] 6/28 >  Blood x 2 6/28: ngtd 6/28 MRSA PCR neg   Thank you for allowing pharmacy to be a part of this patient's care.  Albertina Parr, PharmD., BCPS Clinical Pharmacist Clinical phone for 01/04/18 until 3:30pm: [phone] If after 3:30pm, please refer to [facility] for unit-specific pharmacist

## 2018-01-04 NOTE — Consult Note (Addendum)
Advanced Heart Failure Team Consult Note   Primary Physician: Jose Heath, No Pcp Per PCP-Cardiologist:  No primary care provider on file.  Reason for Consultation: Mixed septic/cardiogenic shock.  HPI:    Jose Heath is seen today for evaluation of cardiogenic and septic shock at the request of Dr. Halford Chessman.   Jose Heath is a 67 y.o. male with h/o CAD s/p CABG, Chronic systolic CHF EF 18-29%, s/p ICD, DM2, Afib (on eliquis), CKD stage 4, and recent osteomyelitis of R 1st toe s/p transmetatarsal amputation.   Pt persented to [facility] center 01/01/18 c/o fatigue and no urine output. Found to be possibly septic from UTI +/- osteomyelitis with marked ARF (creatinine up to 5.98). Transferred to [facility] for further evaluation and treatment + HD consideration. CXR showed pulmonary edema. Pertinent labs on admission include K 6.2, Cr 5.98, WBC 8.8, BCx NGTD as of 01/04/18, UA with turbid appearance, Large Hgb, and Moderate leukocytes with no Nitrites.  Started on CVVHD 01/02/18. Weight down 20 lbs so far.   BCx 01/01/18 NGTD  Initial Coox 41%. Started on dobutamine and now on dobutamine 7.5 mcg/kg/min and NE 4.    Coox 37.5% this pm on above.   CVVHD on-going, pulling 150-200 cc an hour.   Feeling slightly better today. He remains essentially anuric with UOP of 10 cc. He lives at home with his sister. PTA, he was able to go to the store for himself. He denies SOB or CP currently.   Echo 01/02/18 LVEF 20-25%, Diffuse HK, Mild AI, Mild MR, Mild LAE, RV severely reduced/mod dilated, Mod RAE, PA peak pressure 40 mmg Hf, trivial pericardial effusion.  Review of Systems: [y] = yes, [ ]  = no   General: Weight gain [y]; Weight loss [ ] ; Anorexia [ ] ; Fatigue [ ] ; Fever [ ] ; Chills [ ] ; Weakness [ ]   Cardiac: Chest pain/pressure [ ] ; Resting SOB [ ] ; Exertional SOB [y]; Orthopnea [ ] ; Pedal Edema [y]; Palpitations [ ] ; Syncope [ ] ; Presyncope [ ] ; Paroxysmal nocturnal dyspnea[ ]   Pulmonary: Cough [  ]; Wheezing[ ] ; Hemoptysis[ ] ; Sputum [ ] ; Snoring [ ]   GI: Vomiting[ ] ; Dysphagia[ ] ; Melena[ ] ; Hematochezia [ ] ; Heartburn[ ] ; Abdominal pain [ ] ; Constipation [ ] ; Diarrhea [ ] ; BRBPR [ ]   GU: Hematuria[ ] ; Dysuria [ ] ; Nocturia[ ]   Vascular: Pain in legs with walking [ ] ; Pain in feet with lying flat [ ] ; Non-healing sores [ ] ; Stroke [ ] ; TIA [ ] ; Slurred speech [ ] ;  Neuro: Headaches[ ] ; Vertigo[ ] ; Seizures[ ] ; Paresthesias[ ] ;Blurred vision [ ] ; Diplopia [ ] ; Vision changes [ ]   Ortho/Skin: Arthritis [y]; Joint pain [y]; Muscle pain [ ] ; Joint swelling [ ] ; Back Pain [ ] ; Rash [ ]   Psych: Depression[ ] ; Anxiety[ ]   Heme: Bleeding problems [ ] ; Clotting disorders [ ] ; Anemia [ ]   Endocrine: Diabetes [ ] ; Thyroid dysfunction[ ]   Home Medications Prior to Admission medications   Medication Sig Start Date End Date Taking? Authorizing Provider  acetaminophen (TYLENOL) 500 MG tablet Take 500 mg by mouth every 6 (six) hours as needed.   Yes [provider]  acetaminophen-codeine (TYLENOL #3) 300-30 MG tablet Take 2 tablets by mouth every 6 (six) hours as needed for pain. 12/25/17  Yes [provider]  albuterol (PROVENTIL HFA;VENTOLIN HFA) 108 (90 Base) MCG/ACT inhaler Inhale 1-2 puffs into the lungs every 4 (four) hours as needed for shortness of breath. 11/19/17  Yes [provider]  apixaban (ELIQUIS) 5 MG TABS tablet Take 5 mg by mouth. 12/06/15  Yes [provider]  bethanechol (URECHOLINE) 25 MG tablet Take 25 mg by mouth 2 (two) times daily. 12/23/17  Yes [provider]  carvedilol (COREG) 3.125 MG tablet Take 6.25 mg by mouth 2 (two) times daily.  08/12/16  Yes [provider]  furosemide (LASIX) 40 MG tablet Take 60 mg by mouth daily.  07/28/16  Yes [provider]  IRON PO Take 325 mg by mouth.   Yes [provider]  levothyroxine (SYNTHROID, LEVOTHROID) 100 MCG tablet Take 100 mcg by mouth every morning. 08/14/16  Yes  [provider]  metFORMIN (GLUCOPHAGE) 500 MG tablet Take 500 mg by mouth daily.  09/09/16  Yes [provider]  QVAR REDIHALER 40 MCG/ACT inhaler Inhale 2 puffs into the lungs 2 (two) times daily. 12/21/17  Yes [provider]  silodosin (RAPAFLO) 8 MG CAPS capsule Take 8 mg by mouth daily after supper. 12/23/17  Yes [provider]  amiodarone (PACERONE) 200 MG tablet Take 200 mg by mouth daily. 08/12/16   [provider]  INVANZ 1 g injection See admin instructions. 07/14/16   [provider]  NONFORMULARY OR COMPOUNDED ITEM VANCO/TOB/VORI 1000-300-200MG  POWDER 12/25/17   [provider]    Past Medical History: Past Medical History:  Diagnosis Date  . AICD (automatic cardioverter/defibrillator) present   . Anemia   . Atrial fibrillation ([place])   . Cardiomyopathy ([place])   . CHF (congestive heart failure) ([place])   . Chronic kidney disease   . Coronary artery disease   . Diabetes mellitus without complication ([place])   . Hypothyroidism   . Myocardial infarction ([place])   . Osteomyelitis of foot ([place])   . Thrombocytopenia ([place])     Past Surgical History: Past Surgical History:  Procedure Laterality Date  . CORONARY ARTERY BYPASS GRAFT      Family History: History reviewed. No pertinent family history.  Social History: Social History   Socioeconomic History  . Marital status: Single    Spouse name: Not on file  . Number of children: Not on file  . Years of education: Not on file  . Highest education level: Not on file  Occupational History  . Not on file  Social Needs  . Financial resource strain: Not on file  . Food insecurity:    Worry: Not on file    Inability: Not on file  . Transportation needs:    Medical: Not on file    Non-medical: Not on file  Tobacco Use  . Smoking status: Never Smoker  . Smokeless tobacco: Never Used  Substance and Sexual Activity  . Alcohol use: Not on file  . Drug use: Never  .  Sexual activity: Not Currently    Birth control/protection: None  Lifestyle  . Physical activity:    Days per week: Not on file    Minutes per session: Not on file  . Stress: Not on file  Relationships  . Social connections:    Talks on phone: Not on file    Gets together: Not on file    Attends religious service: Not on file    Active member of club or organization: Not on file    Attends meetings of clubs or organizations: Not on file    Relationship status: Not on file  Other Topics Concern  . Not on file  Social History Narrative  . Not on file  Allergies:  No Known Allergies  Objective:    Vital Signs:   Temp:  [92.1 F (33.4 C)-98.2 F (36.8 C)] 92.1 F (33.4 C) (07/01 1130) Pulse Rate:  [70] 70 (07/01 1205) Resp:  [12-23] 17 (07/01 1400) BP: (81-110)/(46-88) 98/61 (07/01 1400) SpO2:  [61 %-98 %] 76 % (07/01 1400) Weight:  [196 lb 13.9 oz (89.3 kg)] 196 lb 13.9 oz (89.3 kg) (07/01 0417) Last BM Date: 12/30/17  Weight change: Filed Weights   01/02/18 0500 01/03/18 0500 01/04/18 0417  Weight: 216 lb 11.4 oz (98.3 kg) 212 lb 15.4 oz (96.6 kg) 196 lb 13.9 oz (89.3 kg)    Intake/Output:   Intake/Output Summary (Last 24 hours) at 01/04/2018 1419 Last data filed at 01/04/2018 1400 Gross per 24 hour  Intake 2461.37 ml  Output 6778 ml  Net -4316.63 ml      Physical Exam    General:  Well appearing. No resp difficulty HEENT: normal Neck: supple. JVP . Carotids 2+ bilat; no bruits. No lymphadenopathy or thyromegaly appreciated. Cor: PMI nondisplaced. Regular rate & rhythm. No rubs, gallops or murmurs. Lungs: clear Abdomen: soft, nontender, nondistended. No hepatosplenomegaly. No bruits or masses. Good bowel sounds. Extremities: no cyanosis, clubbing, rash, edema Neuro: alert & orientedx3, cranial nerves grossly intact. moves all 4 extremities w/o difficulty. Affect pleasant   Telemetry   V paced 70s, personally reviewed  EKG    V paced 70 bpm,  personally reviewed  Labs   Basic Metabolic Panel: Recent Labs  Lab 01/01/18 1939 01/02/18 0431 01/02/18 1024 01/03/18 0422 01/03/18 1541 01/03/18 2035 01/04/18 0410  NA 129* 129* 130* 133* 131* 135 133*  K 6.2* 4.3 3.7 3.6 3.4* 3.4* 3.7  CL 95* 95* 95* 98 96* 96* 96*  CO2 17* 18* 22 24 23   --  24  GLUCOSE 127* 128* 80 102* 130* 101* 105*  BUN 92* 74* 60* 35* 26* 23 18  CREATININE 5.98* 5.12* 4.22* 2.90* 2.35* 1.90* 1.90*  CALCIUM 7.3* 7.2* 7.0* 7.3* 7.3*  --  7.6*  MG 3.0*  --  2.6*  --   --   --  2.3  PHOS 8.4* 6.7* 5.4* 3.6 3.0  --  1.9*    Liver Function Tests: Recent Labs  Lab 01/01/18 1939 01/02/18 0431 01/03/18 0422 01/03/18 1541 01/04/18 0410  AST 173*  --   --   --   --   ALT 91*  --   --   --   --   ALKPHOS 111  --   --   --   --   BILITOT 2.2*  --   --   --   --   PROT 6.1*  --   --   --   --   ALBUMIN 2.9* 2.8* 2.7* 2.6* 2.7*   No results for input(s): LIPASE, AMYLASE in the last 168 hours. No results for input(s): AMMONIA in the last 168 hours.  CBC: Recent Labs  Lab 01/01/18 1939 01/02/18 1024 01/03/18 0422 01/03/18 1541 01/03/18 2035 01/04/18 0410  WBC 8.8 7.7 6.8 6.8  --  7.1  NEUTROABS 8.2*  --   --   --   --   --   HGB 8.3* 7.7* 7.3* 7.5* 8.5* 7.5*  HCT 27.8* 25.5* 24.3* 24.5* 25.0* 24.7*  MCV 96.9 95.9 95.7 95.7  --  96.1  PLT 180 169 105* 104*  --  103*    Cardiac Enzymes: Recent Labs  Lab 01/01/18 1939 01/02/18 0431 01/02/18 1024  TROPONINI 0.04* 0.03* 0.03*    BNP: BNP (last 3 results) No results for input(s): BNP in the last 8760 hours.  ProBNP (last 3 results) No results for input(s): PROBNP in the last 8760 hours.   CBG: Recent Labs  Lab 01/03/18 1938 01/03/18 2343 01/04/18 0343 01/04/18 0712 01/04/18 1126  GLUCAP 102* 94 90 107* 104*    Coagulation Studies: Recent Labs    01/01/18 1939  LABPROT 30.0*  INR 2.88     Imaging    No results found.   Medications:     Current Medications: .  Chlorhexidine Gluconate Cloth  6 each Topical Daily  . collagenase   Topical BID  . docusate sodium  100 mg Oral BID  . insulin aspart  2-6 Units Subcutaneous Q4H  . levothyroxine  50 mcg Intravenous Daily  . mouth rinse  15 mL Mouth Rinse BID  . sodium chloride flush  10-40 mL Intracatheter Q12H     Infusions: . sodium chloride Stopped (01/04/18 1308)  . DOBUTamine 7.5 mcg/kg/min (01/04/18 1400)  . famotidine (PEPCID) IV Stopped (01/04/18 3664)  . heparin    . norepinephrine (LEVOPHED) Adult infusion 4 mcg/min (01/04/18 1400)  . piperacillin-tazobactam Stopped (01/04/18 1143)  . dialysis replacement fluid (prismasate) 500 mL/hr at 01/04/18 1006  . dialysis replacement fluid (prismasate) 250 mL/hr at 01/04/18 1023  . dialysate (PRISMASATE) 1,500 mL/hr at 01/04/18 1111  . sodium phosphate  Dextrose 5% IVPB 43 mL/hr at 01/04/18 1400  . vancomycin 200 mL/hr at 01/04/18 1400       Jose Heath Profile   Jose Heath is a 67 y.o. male with h/o CAD s/p CABG, Chronic systolic CHF EF 40-34%, s/p AICD, DM2, Afib (on eliquis), CKD stage 3, and recent osteomyelitis of R 1st toe s/p transmetatarsal amputation.   Pt persented to [facility] center 01/01/18 c/o fatigue and no urine output. Found to be septic from UTI +/- osteomyelitis with marked ARF (creatinine up to 5.98).   Assessment/Plan   1. Shock - Likely combined septic/cardiogenic - Remains on dobutamine 7.5 and levophed 4 - BCX NGTD  2. Acute on chronic systolic CHF - h/o CABG - Echo 01/02/18 LVEF 20-25%, Diffuse HK, Mild AI, Mild MR, Mild LAE, RV severely reduced/mod dilated, Mod RAE, PA peak pressure 40 mmg Hf, trivial pericardial effusion. - Coox 37.5% on dobutamine 7.5 mcg/kg/min and NE 5. CVP ~20 - Volume status elevated but improved with CVVHD - Continue volume removal per CVVHD. Essentially anuric.  - No BB with low output/shock - No ACE/ARB/ARNI, spiro, or digoxin with ARF - No room for hydral/imdur with soft  pressures/ CVVHD.  3. ARF on CKD III - Remains on CVVHD per Renal - Unclear how much renal recovery he will have.   4. DM2 - Per primary  5. Afib - On Eliquis PTA. Continue heparin for now.   6. Recent osteomyelitis s/p 1st toe transmetatarsal amputation - Stable.  7. Thrombocytopenia - Plts down to 103 from 180 on heparin/CVVHD.  Medication concerns reviewed with Jose Heath and pharmacy team. Barriers identified: None at this time.   Length of Stay: 3  Annamaria Helling  01/04/2018, 2:19 PM  Advanced Heart Failure Team Pager [phone] (M-F; 7a - 4p)  Please contact [facility] for night-coverage after hours (4p -7a ) and weekends on [email or web address]  Agree with above  67 y/o male with advanced HF due to iCM EF 25-30%, CKD IV, DM and recent LE osteo s/p amputation. Limited functional capacity at home.  Now admitted with profound shock (cardiogenic +/- septic) and progression to ESRD (creatinine 6 on admit  requiring CVVHD - now with 20 pounds of fluid off,. PCT low and co-ox 37% suggesting cardiogenic shock as major issue here. Now anuric  On exam  Critically ill appearing. Cachetic Poor dentition RIJ TLC diffuse ecchymosis around neck Cor RRR with prominent s3 Lungs coarse Ab mildly distended Extremities 2+ edema. multiple ecchymosis and skin tears R foot wrapped. LFV trilysis cath  He has profound cardiogenic shock c/b ESRD in setting of longstanding severe iCM and CKD 4. He is also malnourished with very poor dentition and diffuse ecchymosis and skin tears. His co-ox remains extremely low despite dual pressors. I suspect he will not recover much, if any, kidney function and with his severe HF he will never tolerate iHD. He is not a candidate for advanced therapies (heart-kidney transplant would be only viable option). Thus I feel that there is very little chance that he can have a meaningful survival from this insult. Would consult Palliative Care. We, unfortunately,  have little to offer him. We will sign off but please reconsult if we can be of any assistance.   CRITICAL CARE Performed by: Glori Bickers  Total critical care time: 45 minutes  Critical care time was exclusive of separately billable procedures and treating other patients.  Critical care was necessary to treat or prevent imminent or life-threatening deterioration.  Critical care was time spent personally by me (independent of midlevel providers or residents) on the following activities: development of treatment plan with Jose Heath and/or surrogate as well as nursing, discussions with consultants, evaluation of Jose Heath's response to treatment, examination of Jose Heath, obtaining history from Jose Heath or surrogate, ordering and performing treatments and interventions, ordering and review of laboratory studies, ordering and review of radiographic studies, pulse oximetry and re-evaluation of Jose Heath's condition.  Glori Bickers, MD  8:13 PM

## 2018-01-04 NOTE — Progress Notes (Signed)
S: No events overnight. O:BP (!) 100/47 (BP Location: Right Arm)   Pulse 70   Temp (!) 92.1 F (33.4 C) (Rectal) Comment: Bair Hugger applied   Resp 16   Ht 5\' 9"  (1.753 m)   Wt 89.3 kg (196 lb 13.9 oz)   SpO2 (!) 83%   BMI 29.07 kg/m   Intake/Output Summary (Last 24 hours) at 01/04/2018 1258 Last data filed at 01/04/2018 1200 Gross per 24 hour  Intake 2230.24 ml  Output 6682 ml  Net -4451.76 ml   Intake/Output: I/O last 3 completed shifts: In: 3015.9 [P.O.:1350; I.V.:965.9; IV Piggyback:700] Out: 9073 [Urine:85; Other:8988]  Intake/Output this shift:  Total I/O In: 554 [P.O.:300; I.V.:124.3; IV Piggyback:129.7] Out: 1317 [Other:1317] Weight change: -7.3 kg (-16 lb 1.5 oz) Gen: frail, chronically ill-appearing WM CVS: no rub Resp: occ rhonchi bilaterally Abd: +BS, soft Ext: 1+ edema Skin: unstageable coccygeal pressure injury, right great toe wound  Recent Labs  Lab 01/01/18 1939 01/02/18 0431 01/02/18 1024 01/03/18 0422 01/03/18 1541 01/03/18 2035 01/04/18 0410  NA 129* 129* 130* 133* 131* 135 133*  K 6.2* 4.3 3.7 3.6 3.4* 3.4* 3.7  CL 95* 95* 95* 98 96* 96* 96*  CO2 17* 18* 22 24 23   --  24  GLUCOSE 127* 128* 80 102* 130* 101* 105*  BUN 92* 74* 60* 35* 26* 23 18  CREATININE 5.98* 5.12* 4.22* 2.90* 2.35* 1.90* 1.90*  ALBUMIN 2.9* 2.8*  --  2.7* 2.6*  --  2.7*  CALCIUM 7.3* 7.2* 7.0* 7.3* 7.3*  --  7.6*  PHOS 8.4* 6.7* 5.4* 3.6 3.0  --  1.9*  AST 173*  --   --   --   --   --   --   ALT 91*  --   --   --   --   --   --    Liver Function Tests: Recent Labs  Lab 01/01/18 1939  01/03/18 0422 01/03/18 1541 01/04/18 0410  AST 173*  --   --   --   --   ALT 91*  --   --   --   --   ALKPHOS 111  --   --   --   --   BILITOT 2.2*  --   --   --   --   PROT 6.1*  --   --   --   --   ALBUMIN 2.9*   < > 2.7* 2.6* 2.7*   < > = values in this interval not displayed.   No results for input(s): LIPASE, AMYLASE in the last 168 hours. No results for input(s): AMMONIA  in the last 168 hours. CBC: Recent Labs  Lab 01/01/18 1939 01/02/18 1024 01/03/18 0422 01/03/18 1541 01/03/18 2035 01/04/18 0410  WBC 8.8 7.7 6.8 6.8  --  7.1  NEUTROABS 8.2*  --   --   --   --   --   HGB 8.3* 7.7* 7.3* 7.5* 8.5* 7.5*  HCT 27.8* 25.5* 24.3* 24.5* 25.0* 24.7*  MCV 96.9 95.9 95.7 95.7  --  96.1  PLT 180 169 105* 104*  --  103*   Cardiac Enzymes: Recent Labs  Lab 01/01/18 1939 01/02/18 0431 01/02/18 1024  TROPONINI 0.04* 0.03* 0.03*   CBG: Recent Labs  Lab 01/03/18 1938 01/03/18 2343 01/04/18 0343 01/04/18 0712 01/04/18 1126  GLUCAP 102* 94 90 107* 104*    Iron Studies: No results for input(s): IRON, TIBC, TRANSFERRIN, FERRITIN in the last 72 hours.  Studies/Results: Dg Chest Portable 1 View  Result Date: 01/03/2018 CLINICAL DATA:  Pulmonary edema EXAM: PORTABLE CHEST 1 VIEW COMPARISON:  Chest radiograph 01/02/2018 FINDINGS: Right IJ central venous catheter tip projects over superior vena cava. AST leads project over the heart, unchanged. Stable cardiomegaly. Low lung volumes. Similar-appearing diffuse bilateral interstitial pulmonary opacities with more patchy consolidation within the right mid lower lung. Moderate right and small left pleural effusions. No pneumothorax. Remote right rib fracture. IMPRESSION: Interval increase in right mid lower lung consolidation with persistent bilateral interstitial opacities suggestive of worsening pulmonary edema. Moderate right and small left pleural effusions. Electronically Signed   By: Lovey Newcomer M.D.   On: 01/03/2018 07:13   . Chlorhexidine Gluconate Cloth  6 each Topical Daily  . collagenase   Topical BID  . docusate sodium  100 mg Oral BID  . insulin aspart  2-6 Units Subcutaneous Q4H  . mouth rinse  15 mL Mouth Rinse BID  . sodium chloride flush  10-40 mL Intracatheter Q12H    BMET    Component Value Date/Time   NA 133 (L) 01/04/2018 0410   K 3.7 01/04/2018 0410   CL 96 (L) 01/04/2018 0410   CO2 24  01/04/2018 0410   GLUCOSE 105 (H) 01/04/2018 0410   BUN 18 01/04/2018 0410   CREATININE 1.90 (H) 01/04/2018 0410   CALCIUM 7.6 (L) 01/04/2018 0410   GFRNONAA 35 (L) 01/04/2018 0410   GFRAA 41 (L) 01/04/2018 0410   CBC    Component Value Date/Time   WBC 7.1 01/04/2018 0410   RBC 2.57 (L) 01/04/2018 0410   HGB 7.5 (L) 01/04/2018 0410   HCT 24.7 (L) 01/04/2018 0410   PLT 103 (L) 01/04/2018 0410   MCV 96.1 01/04/2018 0410   MCH 29.2 01/04/2018 0410   MCHC 30.4 01/04/2018 0410   RDW 21.5 (H) 01/04/2018 0410   LYMPHSABS 0.3 (L) 01/01/2018 1939   MONOABS 0.3 01/01/2018 1939   EOSABS 0.0 01/01/2018 1939   BASOSABS 0.0 01/01/2018 1939     Assessment/Plan:  1. Oliguric AKI/CKD in setting of septic shock/ARB/Aldactone/Bactrim as well as biventricular CHF.  Started on CVVHD 01/01/18 and tolerating it well.  Remains pressor dependent and oliguric.     2. Shock- sepsis due to UTI +/- cardiogenic.  On levophed and dobutamine per PCCM as well as broad spectrum abx per PCCM. 3. Acute on chronic biventricular CHF- on dobutamine.  Low EF of 25-30% at baseline.  Tolerating UF with CVVHD. 4. Hyponatremia - due to CHF improved with CVVHD 5. Anemia of critical illness- transfuse as needed 6. Osteo/PVD s/p transmet amputation 7. Decubitus ulcers- WOC consulted  8. Abnormal LFT's - likely due to shock liver 9. Moderate protein malnutrition- per primary 10. Disposition- poor overall prognosis given multiple end-stage disease processes and poor functional and nutritional status, now with ARF.  Recommend palliative care consult to help set goals/limits of care.  Donetta Potts, MD [employer] [phone]

## 2018-01-04 NOTE — Progress Notes (Signed)
PULMONARY / CRITICAL CARE MEDICINE   Name: Jose Heath MRN: [id number] DOB: Mar 07, 1951    ADMISSION DATE:  01/01/2018 CONSULTATION DATE: 01/01/18  REFERRING MD: Transfer from [place]: Septic shock  HISTORY OF PRESENT ILLNESS:   [phone] with hx CAD s/p CABG, CHF (EF 25-30%), AICD, DM, Osteomyelitis of Right 1st toe s/p transmetatarsal amputation, Afib (on eliquis), and and CKD Stage 3, presented today to [place] center c/o fatigue and no UOP x 1 day. He was found there to be in septic shock due to UTI +/- Osteo, AKI-on-CKD, and Hyperkalemia. A Central line was placed. Patient was given 2L IVF bolus and started on broad spectrum antibiotics (Vanc and Zosyn) and vasopressors (Levophed and Dobutamine). He was then transferred to [facility] for continued care and consideration of dialysis.    SUBJECTIVE:  On Dobutamine and Levophed gtts 2L [place] Awake, oriented x2, follows commands On CRRT  VITAL SIGNS: BP (!) 89/50   Pulse 70   Temp 98.2 F (36.8 C) (Oral)   Resp 18   Ht 5\' 9"  (1.753 m)   Wt 196 lb 13.9 oz (89.3 kg)   SpO2 (!) 88%   BMI 29.07 kg/m   HEMODYNAMICS: CVP:  [18 mmHg-31 mmHg] 26 mmHg  VENTILATOR SETTINGS:    INTAKE / OUTPUT: I/O last 3 completed shifts: In: 3015.9 [P.O.:1350; I.V.:965.9; IV Piggyback:700] Out: 9073 [Urine:85; Other:8988]  PHYSICAL EXAMINATION: General: Acute on chronic ill appearing male, laying in bed, in no acute distress   Neuro: Awake, alert, confused to time and situation, Pupils PERRL, no focal deficits HEENT:  Atraumatic, normocephalic, MM moist/pink  Cardiovascular:  Paced rhythm, s1s2, no M/R/G  Lungs: Lungs diminished bilaterally, scattered rhonchi, even, non-labored, no assessory muscle use  Abdomen:  Taut, slightly distended, non-tender   Musculoskeletal:  Generalized edema Skin: unstageable coccygeal pressure injury, right great toe would  LABS:  BMET Recent Labs  Lab  01/03/18 0422 01/03/18 1541 01/03/18 2035 01/04/18 0410  NA 133* 131* 135 133*  K 3.6 3.4* 3.4* 3.7  CL 98 96* 96* 96*  CO2 24 23  --  24  BUN 35* 26* 23 18  CREATININE 2.90* 2.35* 1.90* 1.90*  GLUCOSE 102* 130* 101* 105*    Electrolytes Recent Labs  Lab 01/01/18 1939  01/02/18 1024 01/03/18 0422 01/03/18 1541 01/04/18 0410  CALCIUM 7.3*   < > 7.0* 7.3* 7.3* 7.6*  MG 3.0*  --  2.6*  --   --  2.3  PHOS 8.4*   < > 5.4* 3.6 3.0 1.9*   < > = values in this interval not displayed.    CBC Recent Labs  Lab 01/03/18 0422 01/03/18 1541 01/03/18 2035 01/04/18 0410  WBC 6.8 6.8  --  7.1  HGB 7.3* 7.5* 8.5* 7.5*  HCT 24.3* 24.5* 25.0* 24.7*  PLT 105* 104*  --  103*    Coag's Recent Labs  Lab 01/01/18 1939 01/02/18 1719 01/02/18 1935 01/03/18 0245  APTT 52* >200* >200* >200*  INR 2.88  --   --   --     Sepsis Markers Recent Labs  Lab 01/01/18 1936 01/01/18 1939 01/02/18 0431 01/02/18 1024 01/04/18 0410  LATICACIDVEN 2.4*  --  1.8  --   --   PROCALCITON  --  0.61  --  0.72 0.64    ABG Recent Labs  Lab 01/01/18 2007 01/02/18 0244 01/04/18 0145  PHART 7.325* 7.338* 7.461*  PCO2ART 28.2* 27.6* 32.7  PO2ART 230.0* 120.0* 85.5  Liver Enzymes Recent Labs  Lab 01/01/18 1939  01/03/18 0422 01/03/18 1541 01/04/18 0410  AST 173*  --   --   --   --   ALT 91*  --   --   --   --   ALKPHOS 111  --   --   --   --   BILITOT 2.2*  --   --   --   --   ALBUMIN 2.9*   < > 2.7* 2.6* 2.7*   < > = values in this interval not displayed.    Cardiac Enzymes Recent Labs  Lab 01/01/18 1939 01/02/18 0431 01/02/18 1024  TROPONINI 0.04* 0.03* 0.03*    Glucose Recent Labs  Lab 01/03/18 1129 01/03/18 1503 01/03/18 1938 01/03/18 2343 01/04/18 0343 01/04/18 0712  GLUCAP 112* 129* 102* 94 90 107*    Imaging No results found.   STUDIES:  Renal Ultrasound 6/28>>> 1. Poorly visualized kidneys secondary to body habitus. No definite hydronephrosis.  2.  Findings of cirrhosis as well as a small ascites. 2D echo 6/29>>> EF 20-25%, diffuse hypokinesis, moderate TR, severely reduced systolic function   CULTURES: Blood cultures (6/28) >>> Urine cultures (6/28) >>> Sputum culture (6/28) >>>  ANTIBIOTICS: Vancomycin 6/28>> Zosyn 6/28>>  SIGNIFICANT EVENTS: 6/28: presented to [facility] with c/o fatigue and decreased UOP. Found to be in septic shock with AKI-on-CKD and Hyperkalemia; transferred to [facility]  LINES/TUBES: RIJ TLC [name]) 6/28>>>   DISCUSSION: 89yoM with hx CAD s/p CABG, CHF (EF 25-30%), AICD, DM, Osteomyelitis of Right 1st toe s/p transmetatarsal amputation, Afib (on eliquis), and and CKD Stage 3, presented today to [place] center c/o fatigue and no UOP x 1 day. He was found there to be in septic shock due to UTI +/- Osteo, AKI-on-CKD, and Hyperkalemia. A Central line was placed. Patient was given 2L IVF bolus and started on broad spectrum antibiotics (Vanc and Zosyn) and vasopressors (Levophed and Dobutamine). He was then transferred to [facility] for continued care and consideration of dialysis  ASSESSMENT / PLAN:  PULMONARY A: Acute Hypoxic Respiratory Failure r/t decompensated biventricular heart failure with pulmonary edema: P: Supplemental O2 prn to maintain O2 sats >92% Pulmonary hygiene Volume removal via CRRT per Nephrology Continue Dobutamine Consider Consulting Heart failure team F/u CXR in am 7/2  CARDIOVASCULAR A: Shock - septic +/- cardiogenic  Acute decompensated biventricular heart failure  Mod/severe TR  Hx CAD, CHF, Afib P: Continue Dobutamine Levophed gtt to maintain MAP >65, wean as tolerated Continue to hold home Lasix, losartan, coreg, Eliquis Volume removal with CRRT per Nephrology Consider Heart Failure team consult  RENAL A: AKI-on-Stage 3 CKD Hyperkalemia>>resolved UTI - renal u/s ok  Hyponatremia  P: Nephrology following, appreciate input CRRT per  Nephrology, volume removal as able Monitor I&O's / urine output Trend BMP Ensure adequate renal perfusion Avoid nephrotoxic agents as able Replace electrolytes as indicated   GASTROINTESTINAL A: N/V - resolved Constipation P: PRN Zofran Diet as tolerated  Colace Dulcolax suppository x1 dose  HEMATOLOGIC A: Anemia: - no s/s bleeding  Thrombocytopenia, likely in setting of sepsis P: Monitor for s/sx of bleeding Follow CBC Holding home eliquis for now Transfuse for Hgb<7 SCD's for VTE prophylaxis   INFECTIOUS A: sepsis UTI  ?osteomyelitis P: Monitor fever curve Trend WBCs Trend PCT Follow cultures Abx as above  SKIN A: Unstageable coccygeal pressure injury Right Great Toe full thickness wound (following previous amputation) P: Wound care following, appreciate input Continue Collagenase per wound  care Consider Surgery consult  ENDOCRINE A: DM: P: CBG's SSI  NEUROLOGIC A: No active issues P: Provide supportive care Lights on during the day Avoid sedating meds as able  FAMILY  - Updates:  No family present at bedside 7/1, updated pt.  - Inter-disciplinary family meet or Palliative Care meeting due by:  01/08/2018     Darel Hong, AGACNP-BC [facility] 01/04/2018  9:54 AM

## 2018-01-05 ENCOUNTER — Inpatient Hospital Stay (HOSPITAL_COMMUNITY): Payer: Medicare HMO

## 2018-01-05 LAB — GLUCOSE, CAPILLARY
GLUCOSE-CAPILLARY: 148 mg/dL — AB (ref 70–99)
Glucose-Capillary: 92 mg/dL (ref 70–99)
Glucose-Capillary: 97 mg/dL (ref 70–99)
Glucose-Capillary: 149 mg/dL — ABNORMAL HIGH (ref 70–99)
Glucose-Capillary: 106 mg/dL — ABNORMAL HIGH (ref 70–99)

## 2018-01-05 LAB — RENAL FUNCTION PANEL
ANION GAP: 12 (ref 5–15)
Albumin: 2.9 g/dL — ABNORMAL LOW (ref 3.5–5.0)
Albumin: 2.9 g/dL — ABNORMAL LOW (ref 3.5–5.0)
Anion gap: 9 (ref 5–15)
BUN: 11 mg/dL (ref 8–23)
BUN: 8 mg/dL (ref 8–23)
CHLORIDE: 96 mmol/L — AB (ref 98–111)
CO2: 24 mmol/L (ref 22–32)
CO2: 25 mmol/L (ref 22–32)
Calcium: 7.4 mg/dL — ABNORMAL LOW (ref 8.9–10.3)
Calcium: 7.6 mg/dL — ABNORMAL LOW (ref 8.9–10.3)
Chloride: 100 mmol/L (ref 98–111)
Creatinine, Ser: 1.52 mg/dL — ABNORMAL HIGH (ref 0.61–1.24)
Creatinine, Ser: 1.24 mg/dL (ref 0.61–1.24)
GFR calc Af Amer: 53 mL/min — ABNORMAL LOW (ref >60)
GFR calc non Af Amer: 59 mL/min — ABNORMAL LOW (ref >60)
GFR, EST NON AFRICAN AMERICAN: 46 mL/min — AB (ref >60)
Glucose, Bld: 104 mg/dL — ABNORMAL HIGH (ref 70–99)
Glucose, Bld: 155 mg/dL — ABNORMAL HIGH (ref 70–99)
POTASSIUM: 3.5 mmol/L (ref 3.5–5.1)
Phosphorus: 2 mg/dL — ABNORMAL LOW (ref 2.5–4.6)
Phosphorus: 2.6 mg/dL (ref 2.5–4.6)
Potassium: 3.5 mmol/L (ref 3.5–5.1)
Sodium: 133 mmol/L — ABNORMAL LOW (ref 135–145)
Sodium: 133 mmol/L — ABNORMAL LOW (ref 135–145)

## 2018-01-05 LAB — CBC
HCT: 24.7 % — ABNORMAL LOW (ref 39.0–52.0)
HEMOGLOBIN: 7.5 g/dL — AB (ref 13.0–17.0)
MCH: 29.8 pg (ref 26.0–34.0)
MCHC: 30.4 g/dL (ref 30.0–36.0)
MCV: 98 fL (ref 78.0–100.0)
Platelets: 87 10*3/uL — ABNORMAL LOW (ref 150–400)
RBC: 2.52 MIL/uL — ABNORMAL LOW (ref 4.22–5.81)
RDW: 22.1 % — AB (ref 11.5–15.5)
WBC: 7.2 10*3/uL (ref 4.0–10.5)

## 2018-01-05 LAB — PROCALCITONIN: PROCALCITONIN: 0.52 ng/mL

## 2018-01-05 LAB — COOXEMETRY PANEL
Carboxyhemoglobin: 1.9 % — ABNORMAL HIGH (ref 0.5–1.5)
METHEMOGLOBIN: 1 % (ref 0.0–1.5)
O2 Saturation: 36.9 %
Total hemoglobin: 7.7 g/dL — ABNORMAL LOW (ref 12.0–16.0)

## 2018-01-05 LAB — MAGNESIUM: Magnesium: 2.4 mg/dL (ref 1.7–2.4)

## 2018-01-05 MED ORDER — SODIUM PHOSPHATES 45 MMOLE/15ML IV SOLN
30.0000 mmol | Freq: Once | INTRAVENOUS | Status: AC
  Administered 2018-01-05: 30 mmol via INTRAVENOUS
  Filled 2018-01-05: qty 10

## 2018-01-05 MED ORDER — HALOPERIDOL LACTATE 5 MG/ML IJ SOLN
1.0000 mg | Freq: Once | INTRAMUSCULAR | Status: AC
  Administered 2018-01-05: 1 mg via INTRAVENOUS
  Filled 2018-01-05: qty 1

## 2018-01-05 NOTE — Care Management Note (Signed)
Case Management Note  Patient Details  Name: Jose Heath MRN: [id number] Date of Birth: Mar 07, 1951  Subjective/Objective:     Pt admitted with cardiogenic shock - on vent, pressors and CRRT               Action/Plan:     Expected Discharge Date:                  Expected Discharge Plan:     In-House Referral:  Clinical Social Work  Discharge planning Services  CM Consult  Post Acute Care Choice:    Choice offered to:     DME Arranged:    DME Agency:     HH Arranged:    [place] Agency:     Status of Service:     If discussed at [employer], dates discussed:    Additional Comments:  Maryclare Labrador, RN 01/05/2018, 10:35 AM

## 2018-01-05 NOTE — Progress Notes (Signed)
Patient continues to see dogs and other things in room and on ceiling I had removed wrist restraints at 1000 this Am however now Mr Beldin is pulling at CRRT lines and trying to get OOB despite safety instructions not to. Dr Halford Chessman informed wrist restraints placed for safety

## 2018-01-05 NOTE — Progress Notes (Signed)
PULMONARY / CRITICAL CARE MEDICINE   Name: Jose Heath MRN: [id number] DOB: 1950/08/10    ADMISSION DATE:  01/01/2018 CONSULTATION DATE: 01/01/18  REFERRING MD: Transfer from [place]: Septic shock  HISTORY OF PRESENT ILLNESS:   [phone] with hx CAD s/p CABG, CHF (EF 25-30%), AICD, DM, Osteomyelitis of Right 1st toe s/p transmetatarsal amputation, Afib (on eliquis), and and CKD Stage 3, presented today to [place] center c/o fatigue and no UOP x 1 day. He was found there to be in septic shock due to UTI +/- Osteo, AKI-on-CKD, and Hyperkalemia. A Central line was placed. Patient was given 2L IVF bolus and started on broad spectrum antibiotics (Vanc and Zosyn) and vasopressors (Levophed and Dobutamine). He was then transferred to [facility] for continued care and consideration of dialysis.    SUBJECTIVE:  Pt moaning, complains of pain in sacrum/coccyx Reports that he feels tired and has no energy On 2L [place] Levophed 3 mcg to maintain MAP Dobutamine  VITAL SIGNS: BP 101/88   Pulse 70   Temp (!) 96.1 F (35.6 C) (Axillary) Comment: Carmell Austria, RN notified  Resp (!) 21   Ht 5\' 9"  (1.753 m)   Wt 195 lb 15.8 oz (88.9 kg)   SpO2 90%   BMI 28.94 kg/m   HEMODYNAMICS: CVP:  [18 mmHg-28 mmHg] 22 mmHg  VENTILATOR SETTINGS:    INTAKE / OUTPUT: I/O last 3 completed shifts: In: 3062.6 [P.O.:1270; I.V.:824.6; IV Piggyback:968] Out: 8786 [Urine:20; VEHMC:9470]  PHYSICAL EXAMINATION: General: Acute on chronically ill appearing male, laying in bed, on 2L [place], moans, complains of pain in sacrum/coccyx  Neuro: Awake, A&O, disoriented to situation, follow commands, Pupils PERRL, no focal deficits  HEENT:  Atraumatic, normocephalic, no JVD, MM moist/pink    Cardiovascular:  Paced rhythm, s1s2 noted, No M/R/G  Lungs: Lungs diminished bilaterally, scattered rhonchi, even, non-labored, no assessory muscle use  Abdomen:  Taut, tender, distended, Hypoactive  BS, Stool this am   Musculoskeletal:  Generalized edema   Skin: Unstageable coccygeal pressure injury, right great toe amputation wound wrapped in gauze  LABS:  BMET Recent Labs  Lab 01/04/18 0410 01/04/18 1414 01/05/18 0419  NA 133* 132* 133*  K 3.7 3.7 3.5  CL 96* 97* 100  CO2 24 22 24   BUN 18 15 11   CREATININE 1.90* 1.77* 1.52*  GLUCOSE 105* 157* 104*    Electrolytes Recent Labs  Lab 01/02/18 1024  01/04/18 0410 01/04/18 1414 01/05/18 0419  CALCIUM 7.0*   < > 7.6* 7.4* 7.4*  MG 2.6*  --  2.3  --  2.4  PHOS 5.4*   < > 1.9* 3.2 2.0*   < > = values in this interval not displayed.    CBC Recent Labs  Lab 01/03/18 1541 01/03/18 2035 01/04/18 0410 01/05/18 0419  WBC 6.8  --  7.1 7.2  HGB 7.5* 8.5* 7.5* 7.5*  HCT 24.5* 25.0* 24.7* 24.7*  PLT 104*  --  103* 87*    Coag's Recent Labs  Lab 01/01/18 1939 01/02/18 1719 01/02/18 1935 01/03/18 0245  APTT 52* >200* >200* >200*  INR 2.88  --   --   --     Sepsis Markers Recent Labs  Lab 01/01/18 1936  01/02/18 0431 01/02/18 1024 01/04/18 0410 01/05/18 0419  LATICACIDVEN 2.4*  --  1.8  --   --   --   PROCALCITON  --    < >  --  0.72 0.64 0.52   < > =  values in this interval not displayed.    ABG Recent Labs  Lab 01/01/18 2007 01/02/18 0244 01/04/18 0145  PHART 7.325* 7.338* 7.461*  PCO2ART 28.2* 27.6* 32.7  PO2ART 230.0* 120.0* 85.5    Liver Enzymes Recent Labs  Lab 01/01/18 1939  01/04/18 0410 01/04/18 1414 01/05/18 0419  AST 173*  --   --   --   --   ALT 91*  --   --   --   --   ALKPHOS 111  --   --   --   --   BILITOT 2.2*  --   --   --   --   ALBUMIN 2.9*   < > 2.7* 2.9* 2.9*   < > = values in this interval not displayed.    Cardiac Enzymes Recent Labs  Lab 01/01/18 1939 01/02/18 0431 01/02/18 1024  TROPONINI 0.04* 0.03* 0.03*    Glucose Recent Labs  Lab 01/04/18 1126 01/04/18 1535 01/04/18 1944 01/04/18 2326 01/05/18 0338 01/05/18 0718  GLUCAP 104* 138* 125*  102* 92 97    Imaging Dg Chest Port 1 View  Result Date: 01/05/2018 CLINICAL DATA:  Acute respiratory failure.  Shortness of breath. EXAM: PORTABLE CHEST 1 VIEW COMPARISON:  01/03/2018 FINDINGS: Previous median sternotomy and CABG. Right internal jugular central line tip in the SVC 3 cm above the right atrium. Pacemaker/AICD remains in place. Interstitial and alveolar edema persists, with more focal density in the lower lungs that could represent resistant. No measurable effusion is seen. IMPRESSION: Persistent findings of congestive heart failure. More pronounced density in the lower lobes could also represent coexistent pneumonia. Electronically Signed   By: Nelson Chimes M.D.   On: 01/05/2018 07:01     STUDIES:  Renal Ultrasound 6/28>>> 1. Poorly visualized kidneys secondary to body habitus. No definite hydronephrosis.  2. Findings of cirrhosis as well as a small ascites. 2D echo 6/29>>> EF 20-25%, diffuse hypokinesis, moderate TR, severely reduced systolic function   CULTURES: Blood cultures (6/28) >>>  ANTIBIOTICS: Vancomycin 6/28>> Zosyn 6/28>>  SIGNIFICANT EVENTS: 6/28: presented to [facility] with c/o fatigue and decreased UOP. Found to be in septic shock with AKI-on-CKD and Hyperkalemia; transferred to [facility]  LINES/TUBES: RIJ TLC [name]) 6/28>>> L Femoral HD catheter 6/29>>   DISCUSSION: 69SWN with hx CAD s/p CABG, CHF (EF 25-30%), AICD, DM, Osteomyelitis of Right 1st toe s/p transmetatarsal amputation, Afib (on eliquis), and and CKD Stage 3, presented today to [place] center c/o fatigue and no UOP x 1 day. He was found there to be in septic shock due to UTI +/- Osteo, AKI-on-CKD, and Hyperkalemia. A Central line was placed. Patient was given 2L IVF bolus and started on broad spectrum antibiotics (Vanc and Zosyn) and vasopressors (Levophed and Dobutamine). He was then transferred to [facility] for continued care and consideration of dialysis.  Pt with  shock (componenet of sepsis & cardiogenic) with Biventricular HF requiring Dobutamine and Levophed, and AKI requiring CRRT.  ASSESSMENT / PLAN:  PULMONARY A: Acute Hypoxic Respiratory Failure r/t decompensated biventricular heart failure with pulmonary edema: Focal density in Lower lungs on CXR 7/2 P: Supplemental O2 prn to maitain O2 sats >92% Pulmonary hygiene Volume removal via CRRT per Nephrology Continue Dobutamine F/u CXR in am 7/3 Continue Vancomycin & Zosyn  CARDIOVASCULAR A: Shock: septic + cardiogenic  Acute decompensated biventricular heart failure  Mod/severe TR  Hx CAD, CHF, Afib P: Continue Dobutamine Levophed gtt to maintain MAP >65, wean as tolerated Continue  holding home Lasix, Losartan, coreg, eliquis Volume removal via CRRT per Nephrology Heart Failure service consulted, appreciate input Per HF service, unlikely pt will recover, and given his severe HF he will most likely never be able to tolerate HD (not a candidate for heart or kidney transplant) - Recommends Palliative Care   RENAL A: AKI-on-Stage 3 CKD Hyperkalemia>>resolved UTI - renal u/s ok  Hyponatremia  P: Nephrology following, appreciate input CRRT per Nephrology, volume removal as tolerated Monitor I&O's / urine output Trend BMP Ensure adequate renal perfusion Avoid Nephrotoxic agents as able Replace electrolytes as indicated   GASTROINTESTINAL A: N/V - resolved Constipation -6 documented stools on 7/1 P: PRN Zofran Diet as tolerated Consider KUB for abdominal distention   HEMATOLOGIC A: Anemia: - no s/s bleeding  Thrombocytopenia, likely in setting of sepsis P: Monitor for s/sx of bleeding Follow CBC Holding home Eliquis for now Transfuse for Hgb<7 Transfuse PLT for Plt < 50 and bleeding SCD's for VTE prophylaxis   INFECTIOUS A: sepsis UTI  ?osteomyelitis P: Monitor fever curve Trend WBC's Trend PCT Follow cultures Abx as above    SKIN A: Unstageable coccygeal pressure injury Right Great Toe full thickness wound (following previous amputation) P: Wound care following, appreciate input Continue Collagenase per wound care   ENDOCRINE A: DM: P: CBG's SSI  NEUROLOGIC A: No active issues P: Provide supportive care Lights on during the day Avoid sedating meds as able   FAMILY  - Updates:  No family currently at bedside. Will attempt to reach pt's son Jailan Trimm today to discuss goals of care as pt's prognosis is very given his Biventricular HF requiring Dobutamine and Levophed, as well as AKI requiring CRRT.  Pt unlikely to be unable to tolerate intermittent HD given his poor cardiac function.    - Inter-disciplinary family meet or Palliative Care meeting due by:  01/08/2018     Darel Hong, AGACNP-BC [facility] 01/05/2018  9:24 AM

## 2018-01-05 NOTE — Progress Notes (Signed)
[place] Progress Note Patient Name: Aleksey Newbern DOB: 11/23/1950 MRN: [id number]   Date of Service  01/05/2018  HPI/Events of Note  Delirium - Trying to get out of bed.   eICU Interventions  Will order: 1. Bilateral soft wrist restraints. 2. Haldol 1 mg IV now.     Intervention Category Major Interventions: Delirium, psychosis, severe agitation - evaluation and management  Sommer,Steven Eugene 01/05/2018, 5:45 AM

## 2018-01-05 NOTE — Progress Notes (Signed)
S: Has become a little more delirious overnight and seeing "candy dissappear"  O:BP 98/60   Pulse 70   Temp (!) 96.1 F (35.6 C) (Axillary) Comment: Carmell Austria, RN notified  Resp 16   Ht 5\' 9"  (1.753 m)   Wt 88.9 kg (195 lb 15.8 oz)   SpO2 (!) 87%   BMI 28.94 kg/m   Intake/Output Summary (Last 24 hours) at 01/05/2018 1128 Last data filed at 01/05/2018 1000 Gross per 24 hour  Intake 2241.97 ml  Output 6172 ml  Net -3930.03 ml   Intake/Output: I/O last 3 completed shifts: In: 3062.6 [P.O.:1270; I.V.:824.6; IV Piggyback:968] Out: 9367 [Urine:20; YOVZC:5885]  Intake/Output this shift:  Total I/O In: 293.8 [P.O.:180; I.V.:63.8; IV Piggyback:50] Out: 693 [Other:693] Weight change: -0.4 kg (-14.1 oz) Gen: elderly, frail WM, somewhat agitated CVS: no rub Resp: scattered rhonchi Abd: +BS, soft Ext: no edema, s/p right great toe amputation Skin:  unstageable coccygeal pressure injury  Recent Labs  Lab 01/01/18 1939 01/02/18 0431 01/02/18 1024 01/03/18 0422 01/03/18 1541 01/03/18 2035 01/04/18 0410 01/04/18 1414 01/05/18 0419  NA 129* 129* 130* 133* 131* 135 133* 132* 133*  K 6.2* 4.3 3.7 3.6 3.4* 3.4* 3.7 3.7 3.5  CL 95* 95* 95* 98 96* 96* 96* 97* 100  CO2 17* 18* 22 24 23   --  24 22 24   GLUCOSE 127* 128* 80 102* 130* 101* 105* 157* 104*  BUN 92* 74* 60* 35* 26* 23 18 15 11   CREATININE 5.98* 5.12* 4.22* 2.90* 2.35* 1.90* 1.90* 1.77* 1.52*  ALBUMIN 2.9* 2.8*  --  2.7* 2.6*  --  2.7* 2.9* 2.9*  CALCIUM 7.3* 7.2* 7.0* 7.3* 7.3*  --  7.6* 7.4* 7.4*  PHOS 8.4* 6.7* 5.4* 3.6 3.0  --  1.9* 3.2 2.0*  AST 173*  --   --   --   --   --   --   --   --   ALT 91*  --   --   --   --   --   --   --   --    Liver Function Tests: Recent Labs  Lab 01/01/18 1939  01/04/18 0410 01/04/18 1414 01/05/18 0419  AST 173*  --   --   --   --   ALT 91*  --   --   --   --   ALKPHOS 111  --   --   --   --   BILITOT 2.2*  --   --   --   --   PROT 6.1*  --   --   --   --   ALBUMIN 2.9*   < >  2.7* 2.9* 2.9*   < > = values in this interval not displayed.   No results for input(s): LIPASE, AMYLASE in the last 168 hours. No results for input(s): AMMONIA in the last 168 hours. CBC: Recent Labs  Lab 01/01/18 1939 01/02/18 1024 01/03/18 0422 01/03/18 1541 01/03/18 2035 01/04/18 0410 01/05/18 0419  WBC 8.8 7.7 6.8 6.8  --  7.1 7.2  NEUTROABS 8.2*  --   --   --   --   --   --   HGB 8.3* 7.7* 7.3* 7.5* 8.5* 7.5* 7.5*  HCT 27.8* 25.5* 24.3* 24.5* 25.0* 24.7* 24.7*  MCV 96.9 95.9 95.7 95.7  --  96.1 98.0  PLT 180 169 105* 104*  --  103* 87*   Cardiac Enzymes: Recent Labs  Lab 01/01/18 1939 01/02/18 0431  01/02/18 1024  TROPONINI 0.04* 0.03* 0.03*   CBG: Recent Labs  Lab 01/04/18 1944 01/04/18 2326 01/05/18 0338 01/05/18 0718 01/05/18 1124  GLUCAP 125* 102* 92 97 148*    Iron Studies: No results for input(s): IRON, TIBC, TRANSFERRIN, FERRITIN in the last 72 hours. Studies/Results: Dg Chest Port 1 View  Result Date: 01/05/2018 CLINICAL DATA:  Acute respiratory failure.  Shortness of breath. EXAM: PORTABLE CHEST 1 VIEW COMPARISON:  01/03/2018 FINDINGS: Previous median sternotomy and CABG. Right internal jugular central line tip in the SVC 3 cm above the right atrium. Pacemaker/AICD remains in place. Interstitial and alveolar edema persists, with more focal density in the lower lungs that could represent resistant. No measurable effusion is seen. IMPRESSION: Persistent findings of congestive heart failure. More pronounced density in the lower lobes could also represent coexistent pneumonia. Electronically Signed   By: Nelson Chimes M.D.   On: 01/05/2018 07:01   . Chlorhexidine Gluconate Cloth  6 each Topical Daily  . collagenase   Topical BID  . docusate sodium  100 mg Oral BID  . insulin aspart  2-6 Units Subcutaneous Q4H  . levothyroxine  50 mcg Intravenous Daily  . mouth rinse  15 mL Mouth Rinse BID  . sodium chloride flush  10-40 mL Intracatheter Q12H    BMET     Component Value Date/Time   NA 133 (L) 01/05/2018 0419   K 3.5 01/05/2018 0419   CL 100 01/05/2018 0419   CO2 24 01/05/2018 0419   GLUCOSE 104 (H) 01/05/2018 0419   BUN 11 01/05/2018 0419   CREATININE 1.52 (H) 01/05/2018 0419   CALCIUM 7.4 (L) 01/05/2018 0419   GFRNONAA 46 (L) 01/05/2018 0419   GFRAA 53 (L) 01/05/2018 0419   CBC    Component Value Date/Time   WBC 7.2 01/05/2018 0419   RBC 2.52 (L) 01/05/2018 0419   HGB 7.5 (L) 01/05/2018 0419   HCT 24.7 (L) 01/05/2018 0419   PLT 87 (L) 01/05/2018 0419   MCV 98.0 01/05/2018 0419   MCH 29.8 01/05/2018 0419   MCHC 30.4 01/05/2018 0419   RDW 22.1 (H) 01/05/2018 0419   LYMPHSABS 0.3 (L) 01/01/2018 1939   MONOABS 0.3 01/01/2018 1939   EOSABS 0.0 01/01/2018 1939   BASOSABS 0.0 01/01/2018 1939     Assessment/Plan:  1. Oliguric AKI/CKD in setting of septic shock/ARB/Aldactone/Bactrim as well as biventricular CHF.  Started on CVVHD 01/01/18 and tolerating it well.  Remains pressor dependent and oliguric.  1. Continue with CVVHD 4K/2.5 Ca for all fluids, will decrease UF goal to 50-100 ml/min    2. Shock- sepsis due to UTI +/- cardiogenic.  On levophed and dobutamine per PCCM as well as broad spectrum abx per PCCM. 3. Acute on chronic biventricular CHF- on dobutamine.  Low EF of 25-30% at baseline.  Tolerating UF with CVVHD. 4. Hyponatremia - due to CHF improved with CVVHD 5. F/E/N- low K and phos will add KPhos IV and follow 6. Anemia of critical illness- transfuse as needed 7. Osteo/PVD s/p transmet amputation 8. Decubitus ulcers- WOC consulted  9. Abnormal LFT's - likely due to shock liver 10. Moderate protein malnutrition- per primary 11. Disposition- poor overall prognosis given multiple end-stage disease processes and poor functional and nutritional status, now with ARF.  Recommend palliative care consult to help set goals/limits of care.   Donetta Potts, MD [employer] [phone]

## 2018-01-06 ENCOUNTER — Inpatient Hospital Stay (HOSPITAL_COMMUNITY): Payer: Medicare HMO

## 2018-01-06 ENCOUNTER — Encounter: Payer: Medicare HMO | Admitting: Sports Medicine

## 2018-01-06 LAB — PROCALCITONIN: PROCALCITONIN: 0.58 ng/mL

## 2018-01-06 LAB — GLUCOSE, CAPILLARY
GLUCOSE-CAPILLARY: 121 mg/dL — AB (ref 70–99)
GLUCOSE-CAPILLARY: 134 mg/dL — AB (ref 70–99)
GLUCOSE-CAPILLARY: 158 mg/dL — AB (ref 70–99)
GLUCOSE-CAPILLARY: 95 mg/dL (ref 70–99)
GLUCOSE-CAPILLARY: 95 mg/dL (ref 70–99)
GLUCOSE-CAPILLARY: 96 mg/dL (ref 70–99)
Glucose-Capillary: 95 mg/dL (ref 70–99)

## 2018-01-06 LAB — CBC
HEMATOCRIT: 23.6 % — AB (ref 39.0–52.0)
Hemoglobin: 7.1 g/dL — ABNORMAL LOW (ref 13.0–17.0)
MCH: 29.6 pg (ref 26.0–34.0)
MCHC: 30.1 g/dL (ref 30.0–36.0)
MCV: 98.3 fL (ref 78.0–100.0)
Platelets: 73 10*3/uL — ABNORMAL LOW (ref 150–400)
RBC: 2.4 MIL/uL — ABNORMAL LOW (ref 4.22–5.81)
RDW: 22.9 % — AB (ref 11.5–15.5)
WBC: 6 10*3/uL (ref 4.0–10.5)

## 2018-01-06 LAB — RENAL FUNCTION PANEL
ANION GAP: 11 (ref 5–15)
Albumin: 2.7 g/dL — ABNORMAL LOW (ref 3.5–5.0)
Albumin: 2.7 g/dL — ABNORMAL LOW (ref 3.5–5.0)
Anion gap: 12 (ref 5–15)
BUN: 8 mg/dL (ref 8–23)
BUN: 7 mg/dL — ABNORMAL LOW (ref 8–23)
CHLORIDE: 98 mmol/L (ref 98–111)
CHLORIDE: 100 mmol/L (ref 98–111)
CO2: 24 mmol/L (ref 22–32)
CO2: 25 mmol/L (ref 22–32)
CREATININE: 1.39 mg/dL — AB (ref 0.61–1.24)
Calcium: 7.6 mg/dL — ABNORMAL LOW (ref 8.9–10.3)
Calcium: 7.8 mg/dL — ABNORMAL LOW (ref 8.9–10.3)
Creatinine, Ser: 1.43 mg/dL — ABNORMAL HIGH (ref 0.61–1.24)
GFR calc non Af Amer: 50 mL/min — ABNORMAL LOW (ref >60)
GFR, EST AFRICAN AMERICAN: 59 mL/min — AB (ref >60)
GFR, EST AFRICAN AMERICAN: 57 mL/min — AB (ref >60)
GFR, EST NON AFRICAN AMERICAN: 51 mL/min — AB (ref >60)
GLUCOSE: 118 mg/dL — AB (ref 70–99)
Glucose, Bld: 99 mg/dL (ref 70–99)
PHOSPHORUS: 2.1 mg/dL — AB (ref 2.5–4.6)
POTASSIUM: 3.5 mmol/L (ref 3.5–5.1)
Phosphorus: 2 mg/dL — ABNORMAL LOW (ref 2.5–4.6)
Potassium: 3.2 mmol/L — ABNORMAL LOW (ref 3.5–5.1)
SODIUM: 136 mmol/L (ref 135–145)
Sodium: 134 mmol/L — ABNORMAL LOW (ref 135–145)

## 2018-01-06 LAB — CULTURE, BLOOD (ROUTINE X 2)
Culture: NO GROWTH
Culture: NO GROWTH
SPECIAL REQUESTS: ADEQUATE

## 2018-01-06 LAB — COOXEMETRY PANEL
Carboxyhemoglobin: 1.7 % — ABNORMAL HIGH (ref 0.5–1.5)
Methemoglobin: 0.9 % (ref 0.0–1.5)
O2 Saturation: 44.9 %
TOTAL HEMOGLOBIN: 11 g/dL — AB (ref 12.0–16.0)

## 2018-01-06 LAB — MAGNESIUM: MAGNESIUM: 2.3 mg/dL (ref 1.7–2.4)

## 2018-01-06 LAB — VANCOMYCIN, TROUGH: VANCOMYCIN TR: 21 ug/mL — AB (ref 15–20)

## 2018-01-06 MED ORDER — HALOPERIDOL LACTATE 5 MG/ML IJ SOLN
1.0000 mg | Freq: Four times a day (QID) | INTRAMUSCULAR | Status: DC | PRN
  Administered 2018-01-06 – 2018-01-10 (×2): 1 mg via INTRAVENOUS
  Filled 2018-01-06 (×2): qty 1

## 2018-01-06 MED ORDER — POTASSIUM PHOSPHATES 15 MMOLE/5ML IV SOLN
20.0000 mmol | Freq: Once | INTRAVENOUS | Status: AC
  Administered 2018-01-06: 20 mmol via INTRAVENOUS
  Filled 2018-01-06: qty 6.67

## 2018-01-06 MED ORDER — ENSURE ENLIVE PO LIQD
237.0000 mL | Freq: Two times a day (BID) | ORAL | Status: DC
  Administered 2018-01-07 – 2018-01-12 (×7): 237 mL via ORAL

## 2018-01-06 MED ORDER — DEXMEDETOMIDINE HCL IN NACL 400 MCG/100ML IV SOLN
0.0000 ug/kg/h | INTRAVENOUS | Status: DC
  Administered 2018-01-06: 0.2 ug/kg/h via INTRAVENOUS
  Administered 2018-01-07: 0.6 ug/kg/h via INTRAVENOUS
  Administered 2018-01-07: 0.3 ug/kg/h via INTRAVENOUS
  Filled 2018-01-06: qty 200

## 2018-01-06 MED ORDER — FAMOTIDINE 20 MG PO TABS
20.0000 mg | ORAL_TABLET | Freq: Every day | ORAL | Status: DC
  Administered 2018-01-07 – 2018-01-09 (×3): 20 mg via ORAL
  Filled 2018-01-06 (×3): qty 1

## 2018-01-06 MED ORDER — LEVOTHYROXINE SODIUM 100 MCG PO TABS
100.0000 ug | ORAL_TABLET | Freq: Every day | ORAL | Status: DC
  Administered 2018-01-07 – 2018-01-21 (×14): 100 ug via ORAL
  Filled 2018-01-06 (×14): qty 1

## 2018-01-06 MED ORDER — GERHARDT'S BUTT CREAM
TOPICAL_CREAM | CUTANEOUS | Status: DC | PRN
  Administered 2018-01-06 – 2018-01-07 (×2): via TOPICAL
  Administered 2018-01-09: 1 via TOPICAL
  Administered 2018-01-15: 22:00:00 via TOPICAL
  Filled 2018-01-06 (×3): qty 1

## 2018-01-06 MED ORDER — VANCOMYCIN HCL IN DEXTROSE 750-5 MG/150ML-% IV SOLN
750.0000 mg | INTRAVENOUS | Status: DC
  Administered 2018-01-06 – 2018-01-07 (×2): 750 mg via INTRAVENOUS
  Filled 2018-01-06 (×2): qty 150

## 2018-01-06 MED ORDER — HALOPERIDOL LACTATE 5 MG/ML IJ SOLN
1.0000 mg | Freq: Once | INTRAMUSCULAR | Status: AC
  Administered 2018-01-06: 1 mg via INTRAVENOUS
  Filled 2018-01-06: qty 1

## 2018-01-06 MED ORDER — K PHOS MONO-SOD PHOS DI & MONO 155-852-130 MG PO TABS
500.0000 mg | ORAL_TABLET | Freq: Two times a day (BID) | ORAL | Status: DC
  Administered 2018-01-06 – 2018-01-07 (×3): 500 mg via ORAL
  Filled 2018-01-06 (×5): qty 2

## 2018-01-06 NOTE — Progress Notes (Signed)
[place] Progress Note Patient Name: Raziel Koenigs DOB: Nov 28, 1950 MRN: [id number]   Date of Service  01/06/2018  HPI/Events of Note  Delirium - QTc interval = 0.5 seconds.   eICU Interventions  Will order: 1. Haldol 1 mg IV now.      Intervention Category Major Interventions: Delirium, psychosis, severe agitation - evaluation and management  Marilyn Nihiser Eugene 01/06/2018, 12:27 AM

## 2018-01-06 NOTE — Progress Notes (Signed)
PULMONARY / CRITICAL CARE MEDICINE   Name: Ruth Tully MRN: [id number] DOB: 07/19/1950    ADMISSION DATE:  01/01/2018 CONSULTATION DATE: 01/01/18  REFERRING MD: Transfer from [place]: Septic shock  HISTORY OF PRESENT ILLNESS:   [phone] with hx CAD s/p CABG, CHF (EF 25-30%), AICD, DM, Osteomyelitis of Right 1st toe s/p transmetatarsal amputation, Afib (on eliquis), and and CKD Stage 3, presented today to [place] center c/o fatigue and no UOP x 1 day. He was found there to be in septic shock due to UTI +/- Osteo, AKI-on-CKD, and Hyperkalemia. A Central line was placed. Patient was given 2L IVF bolus and started on broad spectrum antibiotics (Vanc and Zosyn) and vasopressors (Levophed and Dobutamine). He was then transferred to [facility] for continued care and initiation of CRRT. Pt also with Biventricular Heart Failure requiring Dobutamine gtt.    SUBJECTIVE:  Confused, agitated Denies pain & SOB currently On 5L [place] Remains on Dobutamine, Levophed currently off Afebrile  VITAL SIGNS: BP (!) 101/48   Pulse 70   Temp 98.3 F (36.8 C) (Oral)   Resp (!) 22   Ht 5\' 9"  (1.753 m)   Wt 186 lb 8.2 oz (84.6 kg)   SpO2 93%   BMI 27.54 kg/m   HEMODYNAMICS: CVP:  [9 mmHg-38 mmHg] 21 mmHg  VENTILATOR SETTINGS:    INTAKE / OUTPUT: I/O last 3 completed shifts: In: 2281.2 [P.O.:670; I.V.:784.4; IV Piggyback:826.8] Out: 8841 [YSAYT:0160; Stool:2]  PHYSICAL EXAMINATION: General: Acute on chronically ill appearing male, laying in bed, on 5L [place], agitated, in no acute distress Neuro: Awake, oriented only to self, agitated, non-cooperative, Pupils PERRL, no focal deficits  HEENT:  Atraumatic, normocephalic, No JVD, MM moist/pink     Cardiovascular:  Paced rhythm, no M/R/G  Lungs: Lungs diminished bilaterally, scattered rhonchi, even, non-labored, no assessory msucle use  Abdomen:  Taut, distended, non-tender, VB+x4, having stools    Musculoskeletal:  Warm, dry.  Generalized edema  Skin: Unstageable coccygeal pressure injury, right great toe amputation wound wrapped in gauze   LABS:  BMET Recent Labs  Lab 01/05/18 0419 01/05/18 1526 01/06/18 0323  NA 133* 133* 134*  K 3.5 3.5 3.2*  CL 100 96* 98  CO2 24 25 24   BUN 11 8 8   CREATININE 1.52* 1.24 1.39*  GLUCOSE 104* 155* 99    Electrolytes Recent Labs  Lab 01/04/18 0410  01/05/18 0419 01/05/18 1526 01/06/18 0323  CALCIUM 7.6*   < > 7.4* 7.6* 7.6*  MG 2.3  --  2.4  --  2.3  PHOS 1.9*   < > 2.0* 2.6 2.1*   < > = values in this interval not displayed.    CBC Recent Labs  Lab 01/04/18 0410 01/05/18 0419 01/06/18 0323  WBC 7.1 7.2 6.0  HGB 7.5* 7.5* 7.1*  HCT 24.7* 24.7* 23.6*  PLT 103* 87* 73*    Coag's Recent Labs  Lab 01/01/18 1939 01/02/18 1719 01/02/18 1935 01/03/18 0245  APTT 52* >200* >200* >200*  INR 2.88  --   --   --     Sepsis Markers Recent Labs  Lab 01/01/18 1936  01/02/18 0431  01/04/18 0410 01/05/18 0419 01/06/18 0323  LATICACIDVEN 2.4*  --  1.8  --   --   --   --   PROCALCITON  --    < >  --    < > 0.64 0.52 0.58   < > = values in this interval not displayed.  ABG Recent Labs  Lab 01/01/18 2007 01/02/18 0244 01/04/18 0145  PHART 7.325* 7.338* 7.461*  PCO2ART 28.2* 27.6* 32.7  PO2ART 230.0* 120.0* 85.5    Liver Enzymes Recent Labs  Lab 01/01/18 1939  01/05/18 0419 01/05/18 1526 01/06/18 0323  AST 173*  --   --   --   --   ALT 91*  --   --   --   --   ALKPHOS 111  --   --   --   --   BILITOT 2.2*  --   --   --   --   ALBUMIN 2.9*   < > 2.9* 2.9* 2.7*   < > = values in this interval not displayed.    Cardiac Enzymes Recent Labs  Lab 01/01/18 1939 01/02/18 0431 01/02/18 1024  TROPONINI 0.04* 0.03* 0.03*    Glucose Recent Labs  Lab 01/05/18 1124 01/05/18 1518 01/05/18 1943 01/06/18 0001 01/06/18 0346 01/06/18 0722  GLUCAP 148* 149* 106* 95 95 96    Imaging Dg Chest Port 1  View  Result Date: 01/06/2018 CLINICAL DATA:  Acute respiratory failure. EXAM: PORTABLE CHEST 1 VIEW COMPARISON:  Radiograph of January 05, 2018. FINDINGS: Stable cardiomegaly. Status post coronary artery bypass graft. Left-sided pacemaker is unchanged in position. Stable right internal jugular catheter. No pneumothorax is noted. Stable bilateral lung opacities are noted most concerning for pulmonary edema or possibly pneumonia. Bony thorax is unremarkable. IMPRESSION: Stable cardiomegaly and bilateral lung opacities as described above. Electronically Signed   By: Marijo Conception, M.D.   On: 01/06/2018 07:39     STUDIES:  Renal Ultrasound 6/28>>> 1. Poorly visualized kidneys secondary to body habitus. No definite hydronephrosis.  2. Findings of cirrhosis as well as a small ascites. 2D echo 6/29>>> EF 20-25%, diffuse hypokinesis, moderate TR, severely reduced systolic function   CULTURES: Blood cultures (6/28) >>>  ANTIBIOTICS: Vancomycin 6/28>> Zosyn 6/28>>  SIGNIFICANT EVENTS: 6/28: presented to [facility] with c/o fatigue and decreased UOP. Found to be in septic shock with AKI-on-CKD and Hyperkalemia; transferred to [facility]  LINES/TUBES: RIJ TLC [name]) 6/28>>> L Femoral HD catheter 6/29>>   DISCUSSION: 40XBD with hx CAD s/p CABG, CHF (EF 25-30%), AICD, DM, Osteomyelitis of Right 1st toe s/p transmetatarsal amputation, Afib (on eliquis), and and CKD Stage 3, presented today to [place] center c/o fatigue and no UOP x 1 day. He was found there to be in septic shock due to UTI +/- Osteo, AKI-on-CKD, and Hyperkalemia. A Central line was placed. Patient was given 2L IVF bolus and started on broad spectrum antibiotics (Vanc and Zosyn) and vasopressors (Levophed and Dobutamine). He was then transferred to [facility] for continued care and consideration of dialysis.  Pt with shock (componenet of sepsis & cardiogenic) with Biventricular HF requiring Dobutamine and Levophed,  and AKI requiring CRRT.  ASSESSMENT / PLAN:  PULMONARY A: Acute Hypoxic Respiratory Failure r/t decompensated biventricular heart failure with pulmonary edema: Focal density in Lower lungs on CXR 7/2 P: Supplemental O2 prn to maintain O2 sats >92% Pulmonary hygiene Volume removal via CRRT per Nephrology Continue Dobutamine F/u CXR in am 7/4 Continue Vancomycin & Zosyn  CARDIOVASCULAR A: Shock: septic + cardiogenic  Acute decompensated biventricular heart failure  Mod/severe TR  Hx CAD, CHF, Afib P: Continue Dobutamine Levophed gtt prn to maintain MAP >65, wean as tolerated Continue holding home Lasix, Losartan, Coreg, Eliquis Volume removal via CRRT per Nephrology Heart failure service consulted, signed off 7/1, appreciate input  Per HF service, unlikely that pt will recover, and given his severe HF he will most likely never be able to tolerate HD (not a candidate for heart of kidney transplant) - Recommends Palliative Care   RENAL A: AKI-on-Stage 3 CKD UTI - renal u/s ok  Mild Hyponatremia  Hypokalmia  P: Nephrology following, appreciate input CRRT per Nephrology, volume removal as tolerated Monitor I&O's / urine output Trend BMP Ensure adequate renal perfusion Avoid Nephrotoxic agents as able Replace electrolytes as indicated    GASTROINTESTINAL A: N/V - resolved Constipation -6 documented stools on 7/1 P: Diet as tolerated Prn Zofran  HEMATOLOGIC A: Anemia: - no s/s bleeding  Thrombocytopenia, likely in setting of sepsis P: Monitor for s/sx of bleeding Follow CBC Holding home Eliquis for now Transfuse for Hgb<7 Transfuse Plt for: Plt count <50 and bleeding SCD's for VTE prophylaxis   INFECTIOUS A: sepsis UTI  ?osteomyelitis P: Monitor fever curve Trend WBC's Trend PCT Follow cultures Abx as above  SKIN A: Unstageable coccygeal pressure injury Right Great Toe full thickness wound (following previous amputation) P: Wound  care saw pt, appreciate input Continue Collagenase per wound care  ENDOCRINE A: DM: P: CBG's q4h SSI Follow ICU hypo/hyperglycemia protocol  NEUROLOGIC A: Delirium P: Provide supportive care Lights on during the day Avoid sedating meds as able Consider adding Seroquel  FAMILY  - Updates: Spoke with pt's son Chandan Fly and pt's 2 sisters at bedside 7/2 regarding pt's poor prognosis and unlikelihood of meaningful recovery given pt's poor cardiac function and AKI.  Pt's son and sisters are in discussion regarding goals of care and whether to proceed with aggressive measures vs. Withdrawal of care. No family currently at bedside 7/3, Will f/u with family on 7/3 to see if they reached a decision regarding goals of care.  Consider Palliative Care consult.  - Inter-disciplinary family meet or Palliative Care meeting due by:  01/08/2018     Darel Hong, AGACNP-BC [facility] 01/06/2018  9:21 AM

## 2018-01-06 NOTE — Progress Notes (Signed)
Met with Mr. Shurley's son.  Explained current status and lack of clinical improvement.  He feels that his father would want to continue with aggressive measures, and not ready to limit care at this point.  Will continue to have discussion with him as events unfold.  Chesley Mires, MD [facility] Pulmonary/Critical Care 01/06/2018, 2:02 PM

## 2018-01-06 NOTE — Progress Notes (Signed)
S:More lethargic this morning  O:BP (!) 104/59   Pulse 69   Temp 97.7 F (36.5 C) (Oral)   Resp 17   Ht 5\' 9"  (1.753 m)   Wt 84.6 kg (186 lb 8.2 oz)   SpO2 (!) 84%   BMI 27.54 kg/m   Intake/Output Summary (Last 24 hours) at 01/06/2018 1213 Last data filed at 01/06/2018 1101 Gross per 24 hour  Intake 2046.14 ml  Output 3671 ml  Net -1624.86 ml   Intake/Output: I/O last 3 completed shifts: In: 2281.2 [P.O.:670; I.V.:784.4; IV Piggyback:826.8] Out: 5277 [OEUMP:5361; Stool:2]  Intake/Output this shift:  Total I/O In: 780.7 [P.O.:644; I.V.:86.7; IV Piggyback:50] Out: 770 [Other:770] Weight change: -4.3 kg (-9 lb 7.7 oz) Gen: critically ill-appearing WM CVS: no rub Resp:scattered rhonchi Abd: benign Ext:no edema  Recent Labs  Lab 01/01/18 1939  01/03/18 0422 01/03/18 1541 01/03/18 2035 01/04/18 0410 01/04/18 1414 01/05/18 0419 01/05/18 1526 01/06/18 0323  NA 129*   < > 133* 131* 135 133* 132* 133* 133* 134*  K 6.2*   < > 3.6 3.4* 3.4* 3.7 3.7 3.5 3.5 3.2*  CL 95*   < > 98 96* 96* 96* 97* 100 96* 98  CO2 17*   < > 24 23  --  24 22 24 25 24   GLUCOSE 127*   < > 102* 130* 101* 105* 157* 104* 155* 99  BUN 92*   < > 35* 26* 23 18 15 11 8 8   CREATININE 5.98*   < > 2.90* 2.35* 1.90* 1.90* 1.77* 1.52* 1.24 1.39*  ALBUMIN 2.9*   < > 2.7* 2.6*  --  2.7* 2.9* 2.9* 2.9* 2.7*  CALCIUM 7.3*   < > 7.3* 7.3*  --  7.6* 7.4* 7.4* 7.6* 7.6*  PHOS 8.4*   < > 3.6 3.0  --  1.9* 3.2 2.0* 2.6 2.1*  AST 173*  --   --   --   --   --   --   --   --   --   ALT 91*  --   --   --   --   --   --   --   --   --    < > = values in this interval not displayed.   Liver Function Tests: Recent Labs  Lab 01/01/18 1939  01/05/18 0419 01/05/18 1526 01/06/18 0323  AST 173*  --   --   --   --   ALT 91*  --   --   --   --   ALKPHOS 111  --   --   --   --   BILITOT 2.2*  --   --   --   --   PROT 6.1*  --   --   --   --   ALBUMIN 2.9*   < > 2.9* 2.9* 2.7*   < > = values in this interval not  displayed.   No results for input(s): LIPASE, AMYLASE in the last 168 hours. No results for input(s): AMMONIA in the last 168 hours. CBC: Recent Labs  Lab 01/01/18 1939  01/03/18 0422 01/03/18 1541  01/04/18 0410 01/05/18 0419 01/06/18 0323  WBC 8.8   < > 6.8 6.8  --  7.1 7.2 6.0  NEUTROABS 8.2*  --   --   --   --   --   --   --   HGB 8.3*   < > 7.3* 7.5*   < > 7.5*  7.5* 7.1*  HCT 27.8*   < > 24.3* 24.5*   < > 24.7* 24.7* 23.6*  MCV 96.9   < > 95.7 95.7  --  96.1 98.0 98.3  PLT 180   < > 105* 104*  --  103* 87* 73*   < > = values in this interval not displayed.   Cardiac Enzymes: Recent Labs  Lab 01/01/18 1939 01/02/18 0431 01/02/18 1024  TROPONINI 0.04* 0.03* 0.03*   CBG: Recent Labs  Lab 01/05/18 1943 01/06/18 0001 01/06/18 0346 01/06/18 0722 01/06/18 1119  GLUCAP 106* 95 95 96 121*    Iron Studies: No results for input(s): IRON, TIBC, TRANSFERRIN, FERRITIN in the last 72 hours. Studies/Results: Dg Chest Port 1 View  Result Date: 01/06/2018 CLINICAL DATA:  Acute respiratory failure. EXAM: PORTABLE CHEST 1 VIEW COMPARISON:  Radiograph of January 05, 2018. FINDINGS: Stable cardiomegaly. Status post coronary artery bypass graft. Left-sided pacemaker is unchanged in position. Stable right internal jugular catheter. No pneumothorax is noted. Stable bilateral lung opacities are noted most concerning for pulmonary edema or possibly pneumonia. Bony thorax is unremarkable. IMPRESSION: Stable cardiomegaly and bilateral lung opacities as described above. Electronically Signed   By: Marijo Conception, M.D.   On: 01/06/2018 07:39   Dg Chest Port 1 View  Result Date: 01/05/2018 CLINICAL DATA:  Acute respiratory failure.  Shortness of breath. EXAM: PORTABLE CHEST 1 VIEW COMPARISON:  01/03/2018 FINDINGS: Previous median sternotomy and CABG. Right internal jugular central line tip in the SVC 3 cm above the right atrium. Pacemaker/AICD remains in place. Interstitial and alveolar edema  persists, with more focal density in the lower lungs that could represent resistant. No measurable effusion is seen. IMPRESSION: Persistent findings of congestive heart failure. More pronounced density in the lower lobes could also represent coexistent pneumonia. Electronically Signed   By: Nelson Chimes M.D.   On: 01/05/2018 07:01   . Chlorhexidine Gluconate Cloth  6 each Topical Daily  . collagenase   Topical BID  . docusate sodium  100 mg Oral BID  . [START ON 01/07/2018] famotidine  20 mg Oral Daily  . insulin aspart  2-6 Units Subcutaneous Q4H  . [START ON 01/07/2018] levothyroxine  100 mcg Oral QAC breakfast  . mouth rinse  15 mL Mouth Rinse BID  . phosphorus  500 mg Oral BID  . sodium chloride flush  10-40 mL Intracatheter Q12H    BMET    Component Value Date/Time   NA 134 (L) 01/06/2018 0323   K 3.2 (L) 01/06/2018 0323   CL 98 01/06/2018 0323   CO2 24 01/06/2018 0323   GLUCOSE 99 01/06/2018 0323   BUN 8 01/06/2018 0323   CREATININE 1.39 (H) 01/06/2018 0323   CALCIUM 7.6 (L) 01/06/2018 0323   GFRNONAA 51 (L) 01/06/2018 0323   GFRAA 59 (L) 01/06/2018 0323   CBC    Component Value Date/Time   WBC 6.0 01/06/2018 0323   RBC 2.40 (L) 01/06/2018 0323   HGB 7.1 (L) 01/06/2018 0323   HCT 23.6 (L) 01/06/2018 0323   PLT 73 (L) 01/06/2018 0323   MCV 98.3 01/06/2018 0323   MCH 29.6 01/06/2018 0323   MCHC 30.1 01/06/2018 0323   RDW 22.9 (H) 01/06/2018 0323   LYMPHSABS 0.3 (L) 01/01/2018 1939   MONOABS 0.3 01/01/2018 1939   EOSABS 0.0 01/01/2018 1939   BASOSABS 0.0 01/01/2018 1939    Assessment/Plan:  1. OliguricAKI/CKD in setting of septic shock/ARB/Aldactone/Bactrim as well as biventricular CHF. Started on  CVVHD 01/01/18 and tolerating it well. Remains pressor dependent and oliguric.  1. Continue with CVVHD 4K/2.5 Ca for all fluids, will decrease UF goal to 50-100 ml/min 2. Shock- sepsis due to UTI +/- cardiogenic. On levophed and dobutamine per PCCM as well as broad  spectrum abx per PCCM. 3. Acute on chronic biventricular CHF- on dobutamine. Low EF of 25-30% at baseline. Tolerating UF with CVVHD. 4. Hyponatremia - due to CHF improved with CVVHD 5. F/E/N- low K and phos will add KPhos IV and follow 6. Anemia of critical illness- transfuse as needed 7. Osteo/PVD s/p transmet amputation with poor wound healing 8. Decubitus ulcers- WOC consulted  9. Abnormal LFT's - likely due to shock liver 10. Thrombocytopenia- heparin d/c'd.  Possible DIC but need to check HIT panel 11. Moderate protein malnutrition- per primary 12. Disposition- poor overall prognosis given multiple end-stage disease processes and poor functional and nutritional status, now with ARF. Recommend palliative care consult to help set goals/limits of care.   Donetta Potts, MD [employer] [phone]

## 2018-01-06 NOTE — Progress Notes (Addendum)
Initial Nutrition Assessment  DOCUMENTATION CODES:   Non-severe (moderate) malnutrition in context of chronic illness  INTERVENTION:    Ensure Enlive po BID, each supplement provides 350 kcal and 20 grams of protein  NUTRITION DIAGNOSIS:   Moderate Malnutrition related to chronic illness(CKD, CHF, CAD) as evidenced by mild fat depletion, mild muscle depletion, moderate muscle depletion.  GOAL:   Patient will meet greater than or equal to 90% of their needs  MONITOR:   PO intake, Supplement acceptance  REASON FOR ASSESSMENT:   Low [place]    ASSESSMENT:   67 yo male with PMH of AICD, CHF, DM, CKD, A fib, osteomyelitis of foot (s/p transmetatarsal amputation), CAD, cardiomyopathy, anemia who was admitted on 6/28 with septic shock.  Discussed patient in ICU rounds and with RN today. Receiving CRRT. Patient is confused, unable to provide any nutrition hx. Noted patient is not a long-term HD candidate. Family does not want to limit care at this time.  Family at bedside reports that patient drinks a "low sugar" PO supplement at home. RN reports that patient has been consuming 50-100% of meals. Family reports poor intake of lunch today. Suspect intake is being affected by confusion and agitation.  Patient with increased protein and calorie needs due to CRRT treatment, malnutrition, and for wound healing. Labs reviewed. Potassium 3.5 (WNL), phosphorus 2 (L) CBG's: [phone] Medications reviewed and include colace, novolog, K Phos.  NUTRITION - FOCUSED PHYSICAL EXAM:    Most Recent Value  Orbital Region  Mild depletion  Upper Arm Region  No depletion  Thoracic and Lumbar Region  Unable to assess  Buccal Region  Mild depletion  Temple Region  Mild depletion  Clavicle Bone Region  Mild depletion  Clavicle and Acromion Bone Region  Mild depletion  Scapular Bone Region  Mild depletion  Dorsal Hand  Mild depletion  Patellar Region  Moderate depletion  Anterior Thigh Region   Moderate depletion  Posterior Calf Region  Unable to assess  Edema (RD Assessment)  Severe  Hair  Reviewed  Eyes  Reviewed  Mouth  Reviewed  Skin  Reviewed  Nails  Reviewed       Diet Order:   Diet Order           Diet renal with fluid restriction Fluid restriction: 1200 mL Fluid; Room service appropriate? Yes; Fluid consistency: Thin  Diet effective now          EDUCATION NEEDS:   No education needs have been identified at this time  Skin:  Skin Assessment: Skin Integrity Issues: Skin Integrity Issues:: Unstageable, Other (Comment) Unstageable: sacrum Other: MASD; R foot large non-healing wound  Last BM:  7/3 type 6  Height:   Ht Readings from Last 1 Encounters:  01/02/18 5\' 9"  (1.753 m)    Weight:   Wt Readings from Last 1 Encounters:  01/06/18 186 lb 8.2 oz (84.6 kg)    Ideal Body Weight:  72.7 kg  BMI:  Body mass index is 27.54 kg/m.  Estimated Nutritional Needs:   Kcal:  2100-2300  Protein:  120-145 gm  Fluid:  2 L    Molli Barrows, RD, LDN, [place] Pager [phone] After Hours Pager [phone]

## 2018-01-06 NOTE — Progress Notes (Signed)
Pharmacy Antibiotic Note  Jose Heath is a 67 y.o. male admitted on 01/01/2018 with sepsis from UTI / ?osteomyelitis.  Pharmacy managing Zosyn and vancomycin dosing. On D#5 of abx. WBC wnl. Afebrile. Continues on CRRT.   A vancomycin trough collected today is supratherapeutic at 21  Plan: -Decrease vancomycin to 750 mg IV Q 24 hours  -Continue Zosyn 3.375 gm IV Q 6 hours  -Monitor CBC, renal fx, cultures and clinical progress  -VT at SS   Height: 5\' 9"  (175.3 cm) Weight: 186 lb 8.2 oz (84.6 kg) IBW/kg (Calculated) : 70.7  Temp (24hrs), Avg:97.3 F (36.3 C), Min:94.1 F (34.5 C), Max:98.5 F (36.9 C)  Recent Labs  Lab 01/01/18 1936  01/02/18 0431  01/03/18 0422 01/03/18 1541  01/04/18 0410 01/04/18 1414 01/05/18 0419 01/05/18 1526 01/06/18 0323  WBC  --    < >  --    < > 6.8 6.8  --  7.1  --  7.2  --  6.0  CREATININE  --    < > 5.12*   < > 2.90* 2.35*   < > 1.90* 1.77* 1.52* 1.24 1.39*  LATICACIDVEN 2.4*  --  1.8  --   --   --   --   --   --   --   --   --    < > = values in this interval not displayed.    Estimated Creatinine Clearance: 52.3 mL/min (A) (by C-G formula based on SCr of 1.39 mg/dL (H)).    No Known Allergies  Ceftriaxone 6/28 x 1 Vancomycin 6/28 >> Zosyn 6/28 >>  Blood x 2 [place] 6/28 > neg (called [name] to confirm) UCx (at ) > candida albicans (called to confirm) Blood x 2 6/28: ngtd 6/28 MRSA PCR neg   Thank you for allowing pharmacy to be a part of this patient's care.  Albertina Parr, PharmD., BCPS Clinical Pharmacist Clinical phone for 01/06/18 until 3:30pm: [phone] If after 3:30pm, please refer to [facility] for unit-specific pharmacist

## 2018-01-06 NOTE — Progress Notes (Signed)
[place] Progress Note Patient Name: Jose Heath DOB: [birth year] MRN: [id number]   Date of Service  01/06/2018  HPI/Events of Note  Profoundly delirious and combative  eICU Interventions  Low dose haloperidol. Dex gtt     Intervention Category Major Interventions: Delirium, psychosis, severe agitation - evaluation and management  Wilhelmina Mcardle 01/06/2018, 7:22 PM

## 2018-01-07 DIAGNOSIS — G9340 Encephalopathy, unspecified: Secondary | ICD-10-CM

## 2018-01-07 DIAGNOSIS — E440 Moderate protein-calorie malnutrition: Secondary | ICD-10-CM

## 2018-01-07 DIAGNOSIS — R0902 Hypoxemia: Secondary | ICD-10-CM

## 2018-01-07 LAB — GLUCOSE, CAPILLARY
GLUCOSE-CAPILLARY: 66 mg/dL — AB (ref 70–99)
GLUCOSE-CAPILLARY: 105 mg/dL — AB (ref 70–99)
GLUCOSE-CAPILLARY: 147 mg/dL — AB (ref 70–99)
GLUCOSE-CAPILLARY: 154 mg/dL — AB (ref 70–99)
Glucose-Capillary: 71 mg/dL (ref 70–99)
Glucose-Capillary: 127 mg/dL — ABNORMAL HIGH (ref 70–99)
Glucose-Capillary: 101 mg/dL — ABNORMAL HIGH (ref 70–99)

## 2018-01-07 LAB — CBC
HEMATOCRIT: 24.3 % — AB (ref 39.0–52.0)
Hemoglobin: 7.2 g/dL — ABNORMAL LOW (ref 13.0–17.0)
MCH: 29.8 pg (ref 26.0–34.0)
MCHC: 29.6 g/dL — AB (ref 30.0–36.0)
MCV: 100.4 fL — AB (ref 78.0–100.0)
Platelets: 70 10*3/uL — ABNORMAL LOW (ref 150–400)
RBC: 2.42 MIL/uL — AB (ref 4.22–5.81)
RDW: 23.5 % — AB (ref 11.5–15.5)
WBC: 5.2 10*3/uL (ref 4.0–10.5)

## 2018-01-07 LAB — RENAL FUNCTION PANEL
ALBUMIN: 2.5 g/dL — AB (ref 3.5–5.0)
ANION GAP: 8 (ref 5–15)
ANION GAP: 9 (ref 5–15)
Albumin: 2.6 g/dL — ABNORMAL LOW (ref 3.5–5.0)
BUN: 7 mg/dL — ABNORMAL LOW (ref 8–23)
BUN: 7 mg/dL — ABNORMAL LOW (ref 8–23)
CALCIUM: 7.5 mg/dL — AB (ref 8.9–10.3)
CALCIUM: 7.4 mg/dL — AB (ref 8.9–10.3)
CHLORIDE: 99 mmol/L (ref 98–111)
CO2: 26 mmol/L (ref 22–32)
CO2: 27 mmol/L (ref 22–32)
CREATININE: 1.24 mg/dL (ref 0.61–1.24)
Chloride: 98 mmol/L (ref 98–111)
Creatinine, Ser: 1.42 mg/dL — ABNORMAL HIGH (ref 0.61–1.24)
GFR, EST AFRICAN AMERICAN: 58 mL/min — AB (ref >60)
GFR, EST NON AFRICAN AMERICAN: 59 mL/min — AB (ref >60)
GFR, EST NON AFRICAN AMERICAN: 50 mL/min — AB (ref >60)
GLUCOSE: 158 mg/dL — AB (ref 70–99)
Glucose, Bld: 69 mg/dL — ABNORMAL LOW (ref 70–99)
PHOSPHORUS: 2.8 mg/dL (ref 2.5–4.6)
Phosphorus: 3.1 mg/dL (ref 2.5–4.6)
Potassium: 3.2 mmol/L — ABNORMAL LOW (ref 3.5–5.1)
Potassium: 3.6 mmol/L (ref 3.5–5.1)
SODIUM: 134 mmol/L — AB (ref 135–145)
Sodium: 133 mmol/L — ABNORMAL LOW (ref 135–145)

## 2018-01-07 LAB — COOXEMETRY PANEL
Carboxyhemoglobin: 1.4 % (ref 0.5–1.5)
Methemoglobin: 1.8 % — ABNORMAL HIGH (ref 0.0–1.5)
O2 SAT: 38.2 %
Total hemoglobin: 7.8 g/dL — ABNORMAL LOW (ref 12.0–16.0)

## 2018-01-07 LAB — MAGNESIUM: MAGNESIUM: 2.2 mg/dL (ref 1.7–2.4)

## 2018-01-07 MED ORDER — INSULIN ASPART 100 UNIT/ML ~~LOC~~ SOLN
0.0000 [IU] | Freq: Three times a day (TID) | SUBCUTANEOUS | Status: DC
  Administered 2018-01-07: 2 [IU] via SUBCUTANEOUS
  Administered 2018-01-07: 1 [IU] via SUBCUTANEOUS
  Administered 2018-01-08 – 2018-01-09 (×3): 2 [IU] via SUBCUTANEOUS
  Administered 2018-01-10: 1 [IU] via SUBCUTANEOUS
  Administered 2018-01-10: 3 [IU] via SUBCUTANEOUS
  Administered 2018-01-10: 1 [IU] via SUBCUTANEOUS
  Administered 2018-01-11 (×2): 2 [IU] via SUBCUTANEOUS
  Administered 2018-01-11 – 2018-01-12 (×2): 1 [IU] via SUBCUTANEOUS
  Administered 2018-01-12 (×2): 2 [IU] via SUBCUTANEOUS
  Administered 2018-01-14 (×2): 1 [IU] via SUBCUTANEOUS
  Administered 2018-01-14: 2 [IU] via SUBCUTANEOUS
  Administered 2018-01-15: 1 [IU] via SUBCUTANEOUS
  Administered 2018-01-15 – 2018-01-17 (×6): 2 [IU] via SUBCUTANEOUS
  Administered 2018-01-18: 1 [IU] via SUBCUTANEOUS
  Administered 2018-01-18: 2 [IU] via SUBCUTANEOUS
  Administered 2018-01-19 (×2): 1 [IU] via SUBCUTANEOUS
  Administered 2018-01-20: 2 [IU] via SUBCUTANEOUS
  Administered 2018-01-21: 1 [IU] via SUBCUTANEOUS
  Administered 2018-01-21: 2 [IU] via SUBCUTANEOUS

## 2018-01-07 MED ORDER — QUETIAPINE FUMARATE 50 MG PO TABS
50.0000 mg | ORAL_TABLET | Freq: Every day | ORAL | Status: DC
  Administered 2018-01-07 – 2018-01-20 (×14): 50 mg via ORAL
  Filled 2018-01-07 (×14): qty 1

## 2018-01-07 MED ORDER — INSULIN ASPART 100 UNIT/ML ~~LOC~~ SOLN: 0.0000 [IU] | Freq: Every day | SUBCUTANEOUS | Status: DC

## 2018-01-07 NOTE — Progress Notes (Addendum)
PULMONARY / CRITICAL CARE MEDICINE   Name: Juvencio Verdi MRN: [id number] DOB: 08/06/1950    ADMISSION DATE:  01/01/2018 CONSULTATION DATE: 01/01/18  REFERRING MD: Transfer from [place]: Septic shock  HISTORY OF PRESENT ILLNESS:   [phone] with hx CAD s/p CABG, CHF (EF 25-30%), AICD, DM, Osteomyelitis of Right 1st toe s/p transmetatarsal amputation, Afib (on eliquis), and and CKD Stage 3, presented today to [place] center c/o fatigue and no UOP x 1 day. He was found there to be in septic shock due to UTI +/- Osteo, AKI-on-CKD, and Hyperkalemia. A Central line was placed. Patient was given 2L IVF bolus and started on broad spectrum antibiotics (Vanc and Zosyn) and vasopressors (Levophed and Dobutamine). He was then transferred to [facility] for continued care and initiation of CRRT. Pt also with Biventricular Heart Failure requiring Dobutamine gtt.    SUBJECTIVE:  Requires initiation of Precedex and Haldol during the night of 01/07/2018 for extreme agitation and being combative  VITAL SIGNS: BP 107/60   Pulse 60   Temp (!) 96.3 F (35.7 C) (Oral)   Resp 14   Ht 5\' 9"  (1.753 m)   Wt 182 lb 1.6 oz (82.6 kg)   SpO2 100%   BMI 26.89 kg/m   HEMODYNAMICS: CVP:  [21 mmHg-25 mmHg] 25 mmHg  VENTILATOR SETTINGS:    INTAKE / OUTPUT: I/O last 3 completed shifts: In: 3993 [P.O.:2114; I.V.:828.8; IV Piggyback:1050.2] Out: 8546 [Urine:35; EVOJJ:0093; Stool:1]  PHYSICAL EXAMINATION: General: Currently sedated with Precedex and Haldol.  Will arouse at times. HEENT: Dentition multiple dental caries noted Neuro: Currently sedated but will arouse speech is not not understandable CV: Sounds are distant PULM: even/non-labored, lungs bilaterally in the bases with coarse rhonchi GH:WEXH, non-tender, bsx4 active  Extremities: warm/dry, 1+ edema  Skin: Sacral and regularly toe wounds   LABS:  BMET Recent Labs  Lab 01/06/18 0323 01/06/18 1442  01/07/18 0349  NA 134* 136 133*  K 3.2* 3.5 3.2*  CL 98 100 99  CO2 24 25 26   BUN 8 7* 7*  CREATININE 1.39* 1.43* 1.24  GLUCOSE 99 118* 69*    Electrolytes Recent Labs  Lab 01/05/18 0419  01/06/18 0323 01/06/18 1442 01/07/18 0349  CALCIUM 7.4*   < > 7.6* 7.8* 7.5*  MG 2.4  --  2.3  --  2.2  PHOS 2.0*   < > 2.1* 2.0* 3.1   < > = values in this interval not displayed.    CBC Recent Labs  Lab 01/05/18 0419 01/06/18 0323 01/07/18 0349  WBC 7.2 6.0 5.2  HGB 7.5* 7.1* 7.2*  HCT 24.7* 23.6* 24.3*  PLT 87* 73* 70*    Coag's Recent Labs  Lab 01/01/18 1939 01/02/18 1719 01/02/18 1935 01/03/18 0245  APTT 52* >200* >200* >200*  INR 2.88  --   --   --     Sepsis Markers Recent Labs  Lab 01/01/18 1936  01/02/18 0431  01/04/18 0410 01/05/18 0419 01/06/18 0323  LATICACIDVEN 2.4*  --  1.8  --   --   --   --   PROCALCITON  --    < >  --    < > 0.64 0.52 0.58   < > = values in this interval not displayed.    ABG Recent Labs  Lab 01/01/18 2007 01/02/18 0244 01/04/18 0145  PHART 7.325* 7.338* 7.461*  PCO2ART 28.2* 27.6* 32.7  PO2ART 230.0* 120.0* 85.5    Liver Enzymes Recent Labs  Lab 01/01/18 1939  01/06/18 0323 01/06/18 1442 01/07/18 0349  AST 173*  --   --   --   --   ALT 91*  --   --   --   --   ALKPHOS 111  --   --   --   --   BILITOT 2.2*  --   --   --   --   ALBUMIN 2.9*   < > 2.7* 2.7* 2.6*   < > = values in this interval not displayed.    Cardiac Enzymes Recent Labs  Lab 01/01/18 1939 01/02/18 0431 01/02/18 1024  TROPONINI 0.04* 0.03* 0.03*    Glucose Recent Labs  Lab 01/06/18 1525 01/06/18 1933 01/06/18 2308 01/07/18 0357 01/07/18 0441 01/07/18 0714  GLUCAP 95 158* 134* 66* 105* 71    Imaging No results found.   STUDIES:  Renal Ultrasound 6/28>>> 1. Poorly visualized kidneys secondary to body habitus. No definite hydronephrosis.  2. Findings of cirrhosis as well as a small ascites. 2D echo 6/29>>> EF 20-25%, diffuse  hypokinesis, moderate TR, severely reduced systolic function   CULTURES: Blood cultures (6/28) >>> 12/09/2017 wound culture>> abundant staph aureus  ANTIBIOTICS: Vancomycin 6/28>> Zosyn 6/28>>  SIGNIFICANT EVENTS: 6/28: presented to [facility] with c/o fatigue and decreased UOP. Found to be in septic shock with AKI-on-CKD and Hyperkalemia; transferred to [facility]  LINES/TUBES: RIJ TLC [name]) 6/28>>> L Femoral HD catheter 6/29>>   DISCUSSION: 61YWV with hx CAD s/p CABG, CHF (EF 25-30%), AICD, DM, Osteomyelitis of Right 1st toe s/p transmetatarsal amputation, Afib (on eliquis), and and CKD Stage 3, presented today to [place] center c/o fatigue and no UOP x 1 day. He was found there to be in septic shock due to UTI +/- Osteo, AKI-on-CKD, and Hyperkalemia. A Central line was placed. Patient was given 2L IVF bolus and started on broad spectrum antibiotics (Vanc and Zosyn) and vasopressors (Levophed and Dobutamine). He was then transferred to [facility] for continued care and consideration of dialysis.  Pt with shock (componenet of sepsis & cardiogenic) with Biventricular HF requiring Dobutamine and Levophed, and AKI requiring CRRT.  01/07/2018 extremely agitated during the night required initiation of Precedex and Haldol.  ASSESSMENT / PLAN:  PULMONARY A: Acute Hypoxic Respiratory Failure r/t decompensated biventricular heart failure with pulmonary edema: Focal density in Lower lungs on CXR 7/2 P: Supplemental oxygen as needed due to low Cholox state and low flow state Pulmonary toilet He may require intubation in the near future Negative I&O per CRRT Antibiotics as prescribed Note he has continued low flow state with poor tissue perfusion which is not getting any better and ultimately will lead to intubation.   CARDIOVASCULAR A: Shock: septic + cardiogenic  Acute decompensated biventricular heart failure  Mod/severe TR  Hx CAD, CHF, Afib P: Currently  on dobutamine.  Levophed currently is off Heart failure team signed out Running dry as possible with CRRT Heart failure service was consulted and signed off 01/04/2018 Both renal and cardiology project poor prognosis    RENAL Lab Results  Component Value Date   CREATININE 1.24 01/07/2018   CREATININE 1.43 (H) 01/06/2018   CREATININE 1.39 (H) 01/06/2018   Recent Labs  Lab 01/06/18 0323 01/06/18 1442 01/07/18 0349  K 3.2* 3.5 3.2*    A: AKI-on-Stage 3 CKD UTI - renal u/s ok  Mild Hyponatremia  Hypokalmia  P: Per nephrology CRRT with -50 to 100 balance Replete electrolytes per renal  GASTROINTESTINAL A: N/V -  resolved Constipation -6 documented stools on 7/1 P: Currently on a renal diet with 1200 cc fluid restriction   HEMATOLOGIC Recent Labs    01/06/18 0323 01/07/18 0349  HGB 7.1* 7.2*    A: Anemia: - no s/s bleeding   Thrombocytopenia, likely in setting of sepsis87->73->70 P: Transfuse per protocol Avoid anticoagulants but is getting heparin due to CRRT   INFECTIOUS A: sepsis UTI  ?osteomyelitis P:  Monitor culture data, wound culture demonstrates heavy growth staph aureus monitor fever curve Antibiotics as needed currently on vancomycin and Zosyn 01/07/2018  SKIN A: Unstageable coccygeal pressure injury Right Great Toe full thickness wound (following previous amputation) P: Wound care team is following Continue current treatment   ENDOCRINE CBG (last 3)  Recent Labs    01/07/18 0357 01/07/18 0441 01/07/18 0714  GLUCAP 66* 105* 71    A: DM: Episode of hypoglycemia 01/07/2018 P: Change sliding scale insulin before meals and at bedtime Change scale to sensitive  NEUROLOGIC A: Delirium P: 01/07/2018 due to increased agitation overnight was placed on a Precedex drip Agitation continues to remain an issue PRN  Haldol Since agitation remains an issue we will start low-dose Seroquel on 01/07/2018  FAMILY  - Updates: Dr. Halford Chessman  spoke with the son 01/06/2018 concerning goals of care.  Son at this time wants to continue aggressive care.  May need to get palliative care involved to get them some different opinions.  - Inter-disciplinary family meet or Palliative Care meeting due by:  01/08/2018     App cct 30 min  Richardson Landry Minor ACNP Maryanna Shape PCCM Pager [phone] till 1 pm If no answer page [phone] 01/07/2018, 7:49 AM  Attending Note:  67 year old male with extensive PMH who presents to PCCM with septic shock.  Patient was very combative overnight and required precedex.  On exam, he is confused but following commands with bibasilar crackles.  I reviewed CXR myself, mild edema noted.  Discussed with PCCM-NP.  Will continue pressor support for now.  Continue abx as ordered.  Precedex and haldol for AMS.  PCCM will continue to follow.  The patient is critically ill with multiple organ systems failure and requires high complexity decision making for assessment and support, frequent evaluation and titration of therapies, application of advanced monitoring technologies and extensive interpretation of multiple databases.   Critical Care Time devoted to patient care services described in this note is  35  Minutes. This time reflects time of care of this signee Dr Jennet Maduro. This critical care time does not reflect procedure time, or teaching time or supervisory time of PA/NP/Med student/Med Resident etc but could involve care discussion time.  Rush Farmer, M.D. [facility] Pulmonary/Critical Care Medicine. Pager: [phone]. After hours pager: [phone].

## 2018-01-07 NOTE — Progress Notes (Signed)
S:became agitated last night, better this morning O:BP (!) 94/54   Pulse 60   Temp (!) 96 F (35.6 C) (Axillary)   Resp 20   Ht 5\' 9"  (1.753 m)   Wt 82.6 kg (182 lb 1.6 oz)   SpO2 100%   BMI 26.89 kg/m   Intake/Output Summary (Last 24 hours) at 01/07/2018 1036 Last data filed at 01/07/2018 1006 Gross per 24 hour  Intake 3760.07 ml  Output 6209 ml  Net -2448.93 ml   Intake/Output: I/O last 3 completed shifts: In: 3993 [P.O.:2114; I.V.:828.8; IV Piggyback:1050.2] Out: 7395 [Urine:35; NOBSJ:6283; Stool:1]  Intake/Output this shift:  Total I/O In: 557.8 [P.O.:473; I.V.:84.8] Out: 781 [Other:781] Weight change: -2 kg (-4 lb 6.6 oz) Gen: cachectic, frail, chronically ill-appearing WM resting comfortably CVS: no rub Resp: scattered rhonchi Abd: +BS, soft, NT Ext: no edema  Recent Labs  Lab 01/01/18 1939  01/04/18 0410 01/04/18 1414 01/05/18 0419 01/05/18 1526 01/06/18 0323 01/06/18 1442 01/07/18 0349  NA 129*   < > 133* 132* 133* 133* 134* 136 133*  K 6.2*   < > 3.7 3.7 3.5 3.5 3.2* 3.5 3.2*  CL 95*   < > 96* 97* 100 96* 98 100 99  CO2 17*   < > 24 22 24 25 24 25 26   GLUCOSE 127*   < > 105* 157* 104* 155* 99 118* 69*  BUN 92*   < > 18 15 11 8 8  7* 7*  CREATININE 5.98*   < > 1.90* 1.77* 1.52* 1.24 1.39* 1.43* 1.24  ALBUMIN 2.9*   < > 2.7* 2.9* 2.9* 2.9* 2.7* 2.7* 2.6*  CALCIUM 7.3*   < > 7.6* 7.4* 7.4* 7.6* 7.6* 7.8* 7.5*  PHOS 8.4*   < > 1.9* 3.2 2.0* 2.6 2.1* 2.0* 3.1  AST 173*  --   --   --   --   --   --   --   --   ALT 91*  --   --   --   --   --   --   --   --    < > = values in this interval not displayed.   Liver Function Tests: Recent Labs  Lab 01/01/18 1939  01/06/18 0323 01/06/18 1442 01/07/18 0349  AST 173*  --   --   --   --   ALT 91*  --   --   --   --   ALKPHOS 111  --   --   --   --   BILITOT 2.2*  --   --   --   --   PROT 6.1*  --   --   --   --   ALBUMIN 2.9*   < > 2.7* 2.7* 2.6*   < > = values in this interval not displayed.   No results  for input(s): LIPASE, AMYLASE in the last 168 hours. No results for input(s): AMMONIA in the last 168 hours. CBC: Recent Labs  Lab 01/01/18 1939  01/03/18 1541  01/04/18 0410 01/05/18 0419 01/06/18 0323 01/07/18 0349  WBC 8.8   < > 6.8  --  7.1 7.2 6.0 5.2  NEUTROABS 8.2*  --   --   --   --   --   --   --   HGB 8.3*   < > 7.5*   < > 7.5* 7.5* 7.1* 7.2*  HCT 27.8*   < > 24.5*   < > 24.7* 24.7* 23.6*  24.3*  MCV 96.9   < > 95.7  --  96.1 98.0 98.3 100.4*  PLT 180   < > 104*  --  103* 87* 73* 70*   < > = values in this interval not displayed.   Cardiac Enzymes: Recent Labs  Lab 01/01/18 1939 01/02/18 0431 01/02/18 1024  TROPONINI 0.04* 0.03* 0.03*   CBG: Recent Labs  Lab 01/06/18 1933 01/06/18 2308 01/07/18 0357 01/07/18 0441 01/07/18 0714  GLUCAP 158* 134* 66* 105* 71    Iron Studies: No results for input(s): IRON, TIBC, TRANSFERRIN, FERRITIN in the last 72 hours. Studies/Results: Dg Chest Port 1 View  Result Date: 01/06/2018 CLINICAL DATA:  Acute respiratory failure. EXAM: PORTABLE CHEST 1 VIEW COMPARISON:  Radiograph of January 05, 2018. FINDINGS: Stable cardiomegaly. Status post coronary artery bypass graft. Left-sided pacemaker is unchanged in position. Stable right internal jugular catheter. No pneumothorax is noted. Stable bilateral lung opacities are noted most concerning for pulmonary edema or possibly pneumonia. Bony thorax is unremarkable. IMPRESSION: Stable cardiomegaly and bilateral lung opacities as described above. Electronically Signed   By: Marijo Conception, M.D.   On: 01/06/2018 07:39   . Chlorhexidine Gluconate Cloth  6 each Topical Daily  . collagenase   Topical BID  . docusate sodium  100 mg Oral BID  . famotidine  20 mg Oral Daily  . feeding supplement (ENSURE ENLIVE)  237 mL Oral BID BM  . insulin aspart  0-5 Units Subcutaneous QHS  . insulin aspart  0-9 Units Subcutaneous TID WC  . levothyroxine  100 mcg Oral QAC breakfast  . mouth rinse  15 mL Mouth  Rinse BID  . phosphorus  500 mg Oral BID  . QUEtiapine  50 mg Oral QHS  . sodium chloride flush  10-40 mL Intracatheter Q12H    BMET    Component Value Date/Time   NA 133 (L) 01/07/2018 0349   K 3.2 (L) 01/07/2018 0349   CL 99 01/07/2018 0349   CO2 26 01/07/2018 0349   GLUCOSE 69 (L) 01/07/2018 0349   BUN 7 (L) 01/07/2018 0349   CREATININE 1.24 01/07/2018 0349   CALCIUM 7.5 (L) 01/07/2018 0349   GFRNONAA 59 (L) 01/07/2018 0349   GFRAA >60 01/07/2018 0349   CBC    Component Value Date/Time   WBC 5.2 01/07/2018 0349   RBC 2.42 (L) 01/07/2018 0349   HGB 7.2 (L) 01/07/2018 0349   HCT 24.3 (L) 01/07/2018 0349   PLT 70 (L) 01/07/2018 0349   MCV 100.4 (H) 01/07/2018 0349   MCH 29.8 01/07/2018 0349   MCHC 29.6 (L) 01/07/2018 0349   RDW 23.5 (H) 01/07/2018 0349   LYMPHSABS 0.3 (L) 01/01/2018 1939   MONOABS 0.3 01/01/2018 1939   EOSABS 0.0 01/01/2018 1939   BASOSABS 0.0 01/01/2018 1939    Assessment/Plan:  1. OliguricAKI/CKD in setting of septic shock/ARB/Aldactone/Bactrim as well as biventricular CHF. Started on CVVHD 01/01/18 and tolerating it well. Remains pressor dependent and oliguric. 1. Continue with CVVHD 4K/2.5 Ca for all fluids, will decrease UF goal to 50-100 ml/min 2. Off pressors so might be able to transition to IHD in the next 24 hours 2. Shock- sepsis due to UTI +/- cardiogenic. Off levophed and dobutamine per PCCM as well as broad spectrum abx per PCCM. 3. Acute on chronic biventricular CHF- on dobutamine. Low EF of 25-30% at baseline. Tolerating UF with CVVHD. 4. Hyponatremia - due to CHF improved with CVVHD 5. F/E/N- low K and phos treated with  KPhos IV and follow 6. Anemia of critical illness- transfuse as needed 7. Osteo/PVD s/p transmet amputation with poor wound healing 8. Decubitus ulcers- WOC consulted  9. Abnormal LFT's - likely due to shock liver 10. Thrombocytopenia- heparin d/c'd.  Possible DIC but need to check HIT panel 11. Moderate  protein malnutrition- per primary 12. Disposition- poor overall prognosis given multiple end-stage disease processes and poor functional and nutritional status, now with ARF. Recommend palliative care consultto help set goals/limits of care.  Donetta Potts, MD [employer] [phone]

## 2018-01-08 ENCOUNTER — Inpatient Hospital Stay (HOSPITAL_COMMUNITY): Payer: Medicare HMO

## 2018-01-08 LAB — CBC WITH DIFFERENTIAL/PLATELET
Basophils Absolute: 0 10*3/uL (ref 0.0–0.1)
Basophils Relative: 0 %
EOS ABS: 0.1 10*3/uL (ref 0.0–0.7)
Eosinophils Relative: 2 %
HCT: 25.7 % — ABNORMAL LOW (ref 39.0–52.0)
Hemoglobin: 7.7 g/dL — ABNORMAL LOW (ref 13.0–17.0)
Lymphocytes Relative: 12 %
Lymphs Abs: 0.6 10*3/uL — ABNORMAL LOW (ref 0.7–4.0)
MCH: 30.4 pg (ref 26.0–34.0)
MCHC: 30 g/dL (ref 30.0–36.0)
MCV: 101.6 fL — AB (ref 78.0–100.0)
MONO ABS: 0.3 10*3/uL (ref 0.1–1.0)
Monocytes Relative: 7 %
NEUTROS PCT: 79 %
Neutro Abs: 3.6 10*3/uL (ref 1.7–7.7)
PLATELETS: 70 10*3/uL — AB (ref 150–400)
RBC: 2.53 MIL/uL — AB (ref 4.22–5.81)
RDW: 24.7 % — AB (ref 11.5–15.5)
WBC: 4.6 10*3/uL (ref 4.0–10.5)

## 2018-01-08 LAB — MAGNESIUM: MAGNESIUM: 2.2 mg/dL (ref 1.7–2.4)

## 2018-01-08 LAB — GLUCOSE, CAPILLARY
GLUCOSE-CAPILLARY: 101 mg/dL — AB (ref 70–99)
GLUCOSE-CAPILLARY: 157 mg/dL — AB (ref 70–99)
GLUCOSE-CAPILLARY: 78 mg/dL (ref 70–99)
Glucose-Capillary: 158 mg/dL — ABNORMAL HIGH (ref 70–99)
Glucose-Capillary: 154 mg/dL — ABNORMAL HIGH (ref 70–99)

## 2018-01-08 LAB — RENAL FUNCTION PANEL
Albumin: 2.5 g/dL — ABNORMAL LOW (ref 3.5–5.0)
Anion gap: 9 (ref 5–15)
BUN: 6 mg/dL — ABNORMAL LOW (ref 8–23)
CALCIUM: 7.4 mg/dL — AB (ref 8.9–10.3)
CO2: 27 mmol/L (ref 22–32)
Chloride: 98 mmol/L (ref 98–111)
Creatinine, Ser: 1.37 mg/dL — ABNORMAL HIGH (ref 0.61–1.24)
GFR, EST NON AFRICAN AMERICAN: 52 mL/min — AB (ref >60)
Glucose, Bld: 87 mg/dL (ref 70–99)
Phosphorus: 2.4 mg/dL — ABNORMAL LOW (ref 2.5–4.6)
Potassium: 3.7 mmol/L (ref 3.5–5.1)
SODIUM: 134 mmol/L — AB (ref 135–145)

## 2018-01-08 LAB — COOXEMETRY PANEL
Carboxyhemoglobin: 1.4 % (ref 0.5–1.5)
Methemoglobin: 1.8 % — ABNORMAL HIGH (ref 0.0–1.5)
O2 SAT: 44.1 %
Total hemoglobin: 8.1 g/dL — ABNORMAL LOW (ref 12.0–16.0)

## 2018-01-08 MED ORDER — PIPERACILLIN-TAZOBACTAM IN DEX 2-0.25 GM/50ML IV SOLN
2.2500 g | Freq: Four times a day (QID) | INTRAVENOUS | Status: DC
  Administered 2018-01-08 – 2018-01-11 (×13): 2.25 g via INTRAVENOUS
  Filled 2018-01-08 (×15): qty 50

## 2018-01-08 NOTE — Progress Notes (Signed)
Pharmacy Antibiotic Note  Margaret Staggs is a 67 y.o. male admitted on 01/01/2018 with sepsis from UTI / ?osteomyelitis.  Pharmacy managing Zosyn and vancomycin dosing. On D#8 of abx. WBC wnl. Afebrile.   Of note, CRRT has been discontinued today. Per nephrology, will monitor for any renal recovery and attempt iHD as tolerated. Patient is likely not a good long-term HD candidate   Plan: -D/c scheduled vancomycin. Vancomycin 1 gm IV Q HD or as necessitated by random levels  -Decrease Zosyn to 2.25 gm IV Q 6 hours  -Monitor CBC, renal fx, cultures and clinical progress    Height: 5\' 9"  (175.3 cm) Weight: 179 lb 7.3 oz (81.4 kg) IBW/kg (Calculated) : 70.7  Temp (24hrs), Avg:98.3 F (36.8 C), Min:97.5 F (36.4 C), Max:98.7 F (37.1 C)  Recent Labs  Lab 01/01/18 1936  01/02/18 0431  01/04/18 0410  01/05/18 0419  01/06/18 0323 01/06/18 1328 01/06/18 1442 01/07/18 0349 01/07/18 1630 01/08/18 0410  WBC  --    < >  --    < > 7.1  --  7.2  --  6.0  --   --  5.2  --  4.6  CREATININE  --    < > 5.12*   < > 1.90*   < > 1.52*   < > 1.39*  --  1.43* 1.24 1.42* 1.37*  LATICACIDVEN 2.4*  --  1.8  --   --   --   --   --   --   --   --   --   --   --   VANCOTROUGH  --   --   --   --   --   --   --   --   --  21*  --   --   --   --    < > = values in this interval not displayed.    Estimated Creatinine Clearance: 53 mL/min (A) (by C-G formula based on SCr of 1.37 mg/dL (H)).    No Known Allergies  Ceftriaxone 6/28 x 1 Vancomycin 6/28 >> Zosyn 6/28 >>  Blood x 2 [place] 6/28 > neg (called [name] to confirm) UCx (at [place]) > candida albicans (called to confirm) Blood x 2 6/28: neg 6/28 MRSA PCR neg   Thank you for allowing pharmacy to be a part of this patient's care.  Albertina Parr, PharmD., BCPS Clinical Pharmacist Clinical phone for 01/08/18 until 3:30pm: [phone] If after 3:30pm, please refer to [facility] for unit-specific pharmacist

## 2018-01-08 NOTE — Progress Notes (Signed)
PULMONARY / CRITICAL CARE MEDICINE   Name: Jose Heath MRN: [id number] DOB: 1951-06-16    ADMISSION DATE:  01/01/2018 CONSULTATION DATE: 01/01/18  REFERRING MD: Transfer from [place]: Septic shock  HISTORY OF PRESENT ILLNESS:   [phone] with hx CAD s/p CABG, CHF (EF 25-30%), AICD, DM, Osteomyelitis of Right 1st toe s/p transmetatarsal amputation, Afib (on eliquis), and and CKD Stage 3, presented today to [place] [facility] c/o fatigue and no UOP x 1 day. He was found there to be in septic shock due to UTI +/- Osteo, AKI-on-CKD, and Hyperkalemia. A Central line was placed. Patient was given 2L IVF bolus and started on broad spectrum antibiotics (Vanc and Zosyn) and vasopressors (Levophed and Dobutamine). He was then transferred to [facility] for continued care and initiation of CRRT. Pt also with Biventricular Heart Failure requiring Dobutamine gtt.    SUBJECTIVE:  Agitation overnight requiring Precedex gtt, precedex currently off  Currently calm and cooperative Denies shortness of breath, no chest or abdominal pain Does report some discomfort to his sacrum/coccyx  Dobutamine infusing, off levophed 3L [place] CVVHD discontinued 7/5   VITAL SIGNS: BP 108/61   Pulse 70   Temp 98.5 F (36.9 C) (Oral)   Resp 18   Ht 5\' 9"  (1.753 m)   Wt 179 lb 7.3 oz (81.4 kg)   SpO2 100%   BMI 26.50 kg/m   HEMODYNAMICS: CVP:  [16 mmHg-18 mmHg] 16 mmHg  VENTILATOR SETTINGS:    INTAKE / OUTPUT: I/O last 3 completed shifts: In: 4254.5 [P.O.:2198; I.V.:906.2; IV Piggyback:1150.3] Out: 7146 [Urine:35; Other:7111]  PHYSICAL EXAMINATION: General: Acute on chronically ill appearing male, laying in bed, on 3L [place], in no acute distress  HEENT: Atraumatic, normocephalic, no JVD, MM pink/moist, poor dentition Neuro: Awake, alert, disoriented to time, calm and cooperative, follows commands, no focal deficits CV: Paced rhythm, distant sounds, No M/R/G PULM: Clear  diminished bilaterally with crackles in the bases, even, non-labored, no assessory muscle use   GI: Soft, non-tender, BS+ x4  Extremities: Warm/dry, 1+ bilateral edema Skin: Unstageable sacral wound, right great toe wound    LABS:  BMET Recent Labs  Lab 01/07/18 0349 01/07/18 1630 01/08/18 0410  NA 133* 134* 134*  K 3.2* 3.6 3.7  CL 99 98 98  CO2 26 27 27   BUN 7* 7* 6*  CREATININE 1.24 1.42* 1.37*  GLUCOSE 69* 158* 87    Electrolytes Recent Labs  Lab 01/06/18 0323  01/07/18 0349 01/07/18 1630 01/08/18 0410  CALCIUM 7.6*   < > 7.5* 7.4* 7.4*  MG 2.3  --  2.2  --  2.2  PHOS 2.1*   < > 3.1 2.8 2.4*   < > = values in this interval not displayed.    CBC Recent Labs  Lab 01/06/18 0323 01/07/18 0349 01/08/18 0410  WBC 6.0 5.2 4.6  HGB 7.1* 7.2* 7.7*  HCT 23.6* 24.3* 25.7*  PLT 73* 70* 70*    Coag's Recent Labs  Lab 01/01/18 1939 01/02/18 1719 01/02/18 1935 01/03/18 0245  APTT 52* >200* >200* >200*  INR 2.88  --   --   --     Sepsis Markers Recent Labs  Lab 01/01/18 1936  01/02/18 0431  01/04/18 0410 01/05/18 0419 01/06/18 0323  LATICACIDVEN 2.4*  --  1.8  --   --   --   --   PROCALCITON  --    < >  --    < > 0.64 0.52 0.58   < > =  values in this interval not displayed.    ABG Recent Labs  Lab 01/01/18 2007 01/02/18 0244 01/04/18 0145  PHART 7.325* 7.338* 7.461*  PCO2ART 28.2* 27.6* 32.7  PO2ART 230.0* 120.0* 85.5    Liver Enzymes Recent Labs  Lab 01/01/18 1939  01/07/18 0349 01/07/18 1630 01/08/18 0410  AST 173*  --   --   --   --   ALT 91*  --   --   --   --   ALKPHOS 111  --   --   --   --   BILITOT 2.2*  --   --   --   --   ALBUMIN 2.9*   < > 2.6* 2.5* 2.5*   < > = values in this interval not displayed.    Cardiac Enzymes Recent Labs  Lab 01/01/18 1939 01/02/18 0431 01/02/18 1024  TROPONINI 0.04* 0.03* 0.03*    Glucose Recent Labs  Lab 01/07/18 0714 01/07/18 1117 01/07/18 1517 01/07/18 1704 01/07/18 2230  01/08/18 0734  GLUCAP 71 127* 147* 154* 101* 101*    Imaging Dg Chest Port 1 View  Result Date: 01/08/2018 CLINICAL DATA:  Agitation.  Respiratory failure. EXAM: PORTABLE CHEST 1 VIEW COMPARISON:  01/06/2018. FINDINGS: Right IJ line stable position. Cardiac pacer in stable position. Prior CABG. Cardiomegaly with diffuse bilateral pulmonary infiltrates/edema and small bilateral pleural effusions. No significant interim change. IMPRESSION: 1.  Right IJ line in stable position. 2. Cardiac pacer in stable position. Prior CABG. Cardiomegaly with diffuse bilateral pulmonary infiltrates/edema and small bilateral pleural effusions. No significant interim change. Electronically Signed   By: Idamay   On: 01/08/2018 05:59     STUDIES:  Renal Ultrasound 6/28>>> 1. Poorly visualized kidneys secondary to body habitus. No definite hydronephrosis.  2. Findings of cirrhosis as well as a small ascites. 2D echo 6/29>>> EF 20-25%, diffuse hypokinesis, moderate TR, severely reduced systolic function   CULTURES: Blood cultures (6/28) >>> negative 12/09/2017 wound culture>> abundant staph aureus  ANTIBIOTICS: Vancomycin 6/28>>7/5 Zosyn 6/28>>  SIGNIFICANT EVENTS: 6/28: presented to [facility] with c/o fatigue and decreased UOP. Found to be in septic shock with AKI-on-CKD and Hyperkalemia; transferred to [facility] 6/28 CVVHD 7/5 CVVHD discontinued  LINES/TUBES: RIJ TLC [name]) 6/28>>> L Femoral HD catheter 6/29>>   DISCUSSION: 19JKD with hx CAD s/p CABG, CHF (EF 25-30%), AICD, DM, Osteomyelitis of Right 1st toe s/p transmetatarsal amputation, Afib (on eliquis), and and CKD Stage 3, presented today to [place] center c/o fatigue and no UOP x 1 day. He was found there to be in septic shock due to UTI +/- Osteo, AKI-on-CKD, and Hyperkalemia. A Central line was placed. Patient was given 2L IVF bolus and started on broad spectrum antibiotics (Vanc and Zosyn) and vasopressors  (Levophed and Dobutamine). He was then transferred to [facility] for continued care and consideration of dialysis.  Pt with shock (componenet of sepsis & cardiogenic) with Biventricular HF requiring Dobutamine and Levophed, and AKI requiring CRRT.  01/07/2018 extremely agitated during the night required initiation of Precedex and Haldol.  ASSESSMENT / PLAN:  PULMONARY A: Acute Hypoxic Respiratory Failure r/t decompensated biventricular heart failure with pulmonary edema: Focal density in Lower lungs on CXR 7/2 P: Supplemental O2 prn to maintain O2 sats >92% Pulmonary toilet Volume removal via CRRT as tolerated per Nephrology Abx as above Continue to have low flow state with poor tissue perfusion with no improvement, may eventually need intubation    CARDIOVASCULAR A: Shock: septic +  cardiogenic  Acute decompensated biventricular heart failure  Mod/severe TR  Hx CAD, CHF, Afib P: Continue Dobutamine Levophed as needed to maintain MAP >65 (currently off) Heart Failure service consulted and singed off 01/04/18, appreciate input, very poor prognosis Volume removal via CVVHD as tolerated per Nephrology   RENAL A: AKI-on-Stage 3 CKD UTI - renal u/s ok  Mild Hyponatremia  Hypokalmia  P: Nephrology following, appreciate input CVVHD per Nephrology, UF 67 yo 100 ml/hr - plan to d/c CRRT 01/08/18 Monitor I&O's / urine output Trend BMP Ensure adequate renal perfusion Replace electrolytes as indicated  GASTROINTESTINAL A: N/V - resolved  P: Continue renal diet as tolerated, 1200 cc fluid restriction   HEMATOLOGIC A: Anemia: - no s/s bleeding   Thrombocytopenia, likely in setting of sepsis87->73->70>70 P: Monitor for s/sx of bleeding Trend CBC Avoid anticoagulants (Heparin d/c with CRRT) Transfuse for Hgb <7 Transfuse for PLT <50 & bleeding SCD's for VTE prophylaxis   INFECTIOUS A: sepsis UTI  ?osteomyelitis P: Monitor fever curve Trend WBC's BC negative,  previous wound culture w/ MRSA & Pseudomonas Continue Zosyn, d/c Vancomycin 7/5  SKIN A: Unstageable coccygeal pressure injury Right Great Toe full thickness wound (following previous amputation) P: Wound care following, appreciate input Continue Santyl per Wound care   ENDOCRINE A: DM: Episode of hypoglycemia 01/07/2018>>resolved P: CBG Continue sensitive scale SSI  NEUROLOGIC A: Delirium ? Sundowning P: Precedex gtt (initiated 7/4) as needed, wean as tolerated PRN haldol Continue Seroquel 50mg  qHS  FAMILY  - Updates: No family present at bedside 7/5.  - Inter-disciplinary family meet or Palliative Care meeting due by:  01/08/2018     Darel Hong, AGACNP-BC [facility] 01/08/2018, 9:22 AM

## 2018-01-08 NOTE — Progress Notes (Addendum)
S: Again had agitation in the evening, possible sundowning.  O:BP (!) 90/47   Pulse 60   Temp 98.5 F (36.9 C) (Oral)   Resp 17   Ht 5\' 9"  (1.753 m)   Wt 81.4 kg (179 lb 7.3 oz)   SpO2 100%   BMI 26.50 kg/m   Intake/Output Summary (Last 24 hours) at 01/08/2018 0836 Last data filed at 01/08/2018 0804 Gross per 24 hour  Intake 2889.3 ml  Output 4357 ml  Net -1467.7 ml   Intake/Output: I/O last 3 completed shifts: In: 4254.5 [P.O.:2198; I.V.:906.2; IV Piggyback:1150.3] Out: 7146 [Urine:35; Other:7111]  Intake/Output this shift:  Total I/O In: 373.7 [P.O.:350; I.V.:23.7] Out: 134 [Other:134] Weight change: -1.2 kg (-2 lb 10.3 oz) Gen: frail, cachectic WM in NAD CVS: no rub Resp: cta Abd: benign Ext: no edema  Recent Labs  Lab 01/01/18 1939  01/05/18 0419 01/05/18 1526 01/06/18 0323 01/06/18 1442 01/07/18 0349 01/07/18 1630 01/08/18 0410  NA 129*   < > 133* 133* 134* 136 133* 134* 134*  K 6.2*   < > 3.5 3.5 3.2* 3.5 3.2* 3.6 3.7  CL 95*   < > 100 96* 98 100 99 98 98  CO2 17*   < > 24 25 24 25 26 27 27   GLUCOSE 127*   < > 104* 155* 99 118* 69* 158* 87  BUN 92*   < > 11 8 8  7* 7* 7* 6*  CREATININE 5.98*   < > 1.52* 1.24 1.39* 1.43* 1.24 1.42* 1.37*  ALBUMIN 2.9*   < > 2.9* 2.9* 2.7* 2.7* 2.6* 2.5* 2.5*  CALCIUM 7.3*   < > 7.4* 7.6* 7.6* 7.8* 7.5* 7.4* 7.4*  PHOS 8.4*   < > 2.0* 2.6 2.1* 2.0* 3.1 2.8 2.4*  AST 173*  --   --   --   --   --   --   --   --   ALT 91*  --   --   --   --   --   --   --   --    < > = values in this interval not displayed.   Liver Function Tests: Recent Labs  Lab 01/01/18 1939  01/07/18 0349 01/07/18 1630 01/08/18 0410  AST 173*  --   --   --   --   ALT 91*  --   --   --   --   ALKPHOS 111  --   --   --   --   BILITOT 2.2*  --   --   --   --   PROT 6.1*  --   --   --   --   ALBUMIN 2.9*   < > 2.6* 2.5* 2.5*   < > = values in this interval not displayed.   No results for input(s): LIPASE, AMYLASE in the last 168 hours. No results  for input(s): AMMONIA in the last 168 hours. CBC: Recent Labs  Lab 01/01/18 1939  01/04/18 0410 01/05/18 0419 01/06/18 0323 01/07/18 0349 01/08/18 0410  WBC 8.8   < > 7.1 7.2 6.0 5.2 4.6  NEUTROABS 8.2*  --   --   --   --   --  3.6  HGB 8.3*   < > 7.5* 7.5* 7.1* 7.2* 7.7*  HCT 27.8*   < > 24.7* 24.7* 23.6* 24.3* 25.7*  MCV 96.9   < > 96.1 98.0 98.3 100.4* 101.6*  PLT 180   < > 103*  87* 73* 70* 70*   < > = values in this interval not displayed.   Cardiac Enzymes: Recent Labs  Lab 01/01/18 1939 01/02/18 0431 01/02/18 1024  TROPONINI 0.04* 0.03* 0.03*   CBG: Recent Labs  Lab 01/07/18 1117 01/07/18 1517 01/07/18 1704 01/07/18 2230 01/08/18 0734  GLUCAP 127* 147* 154* 101* 101*    Iron Studies: No results for input(s): IRON, TIBC, TRANSFERRIN, FERRITIN in the last 72 hours. Studies/Results: Dg Chest Port 1 View  Result Date: 01/08/2018 CLINICAL DATA:  Agitation.  Respiratory failure. EXAM: PORTABLE CHEST 1 VIEW COMPARISON:  01/06/2018. FINDINGS: Right IJ line stable position. Cardiac pacer in stable position. Prior CABG. Cardiomegaly with diffuse bilateral pulmonary infiltrates/edema and small bilateral pleural effusions. No significant interim change. IMPRESSION: 1.  Right IJ line in stable position. 2. Cardiac pacer in stable position. Prior CABG. Cardiomegaly with diffuse bilateral pulmonary infiltrates/edema and small bilateral pleural effusions. No significant interim change. Electronically Signed   By: Marcello Moores  Register   On: 01/08/2018 05:59   . Chlorhexidine Gluconate Cloth  6 each Topical Daily  . collagenase   Topical BID  . docusate sodium  100 mg Oral BID  . famotidine  20 mg Oral Daily  . feeding supplement (ENSURE ENLIVE)  237 mL Oral BID BM  . insulin aspart  0-5 Units Subcutaneous QHS  . insulin aspart  0-9 Units Subcutaneous TID WC  . levothyroxine  100 mcg Oral QAC breakfast  . mouth rinse  15 mL Mouth Rinse BID  . phosphorus  500 mg Oral BID  .  QUEtiapine  50 mg Oral QHS  . sodium chloride flush  10-40 mL Intracatheter Q12H    BMET    Component Value Date/Time   NA 134 (L) 01/08/2018 0410   K 3.7 01/08/2018 0410   CL 98 01/08/2018 0410   CO2 27 01/08/2018 0410   GLUCOSE 87 01/08/2018 0410   BUN 6 (L) 01/08/2018 0410   CREATININE 1.37 (H) 01/08/2018 0410   CALCIUM 7.4 (L) 01/08/2018 0410   GFRNONAA 52 (L) 01/08/2018 0410   GFRAA >60 01/08/2018 0410   CBC    Component Value Date/Time   WBC 4.6 01/08/2018 0410   RBC 2.53 (L) 01/08/2018 0410   HGB 7.7 (L) 01/08/2018 0410   HCT 25.7 (L) 01/08/2018 0410   PLT 70 (L) 01/08/2018 0410   MCV 101.6 (H) 01/08/2018 0410   MCH 30.4 01/08/2018 0410   MCHC 30.0 01/08/2018 0410   RDW 24.7 (H) 01/08/2018 0410   LYMPHSABS 0.6 (L) 01/08/2018 0410   MONOABS 0.3 01/08/2018 0410   EOSABS 0.1 01/08/2018 0410   BASOSABS 0.0 01/08/2018 0410     Assessment/Plan:  1. OliguricAKI/CKD in setting of septic shock/ARB/Aldactone/Bactrim as well as biventricular CHF. Started on CVVHD 01/01/18 and tolerating it well. Remains pressor dependent and oliguric. 1. Was receiving 4K/2.5 Ca for all fluids 2. Off pressors so might be able to transition to IHD.   3. Will stop CVVHD today and follow. 4. Will consult palliative care as he is not sure he would want to continue with HD and he is a poor longterm HD candidate. 2. Shock- sepsis due to UTI +/- cardiogenic. Off levophed and dobutamine per PCCM as well as broad spectrum abx per PCCM. 3. Acute on chronic biventricular CHF- on dobutamine. Low EF of 25-30% at baseline. Tolerating UF with CVVHD.  Will stop CVVHD and follow. 4. Hyponatremia - due to CHF improved with CVVHD 5. F/E/N- low K and phos  treated with KPhos IV and follow 6. Anemia of critical illness- transfuse as needed 7. Osteo/PVD s/p transmet amputationwith poor wound healing 8. Decubitus ulcers- WOC consulted  9. Abnormal LFT's - likely due to shock  liver 10. Thrombocytopenia- heparin d/c'd. Possible DIC but need to check HIT panel 11. Moderate protein malnutrition- per primary 12. Disposition- poor overall prognosis given multiple end-stage disease processes and poor functional and nutritional status, unstageable coccygeal injury, and now with ARF. Recommend palliative care consultto help set goals/limits of care.  He is not sure he would want to continue with HD.   Donetta Potts, MD [employer] [phone]

## 2018-01-09 LAB — CBC
HEMATOCRIT: 24.9 % — AB (ref 39.0–52.0)
HEMOGLOBIN: 7.6 g/dL — AB (ref 13.0–17.0)
MCH: 30.5 pg (ref 26.0–34.0)
MCHC: 30.5 g/dL (ref 30.0–36.0)
MCV: 100 fL (ref 78.0–100.0)
Platelets: 58 10*3/uL — ABNORMAL LOW (ref 150–400)
RBC: 2.49 MIL/uL — AB (ref 4.22–5.81)
RDW: 24.5 % — AB (ref 11.5–15.5)
WBC: 5.6 10*3/uL (ref 4.0–10.5)

## 2018-01-09 LAB — RENAL FUNCTION PANEL
Albumin: 2.3 g/dL — ABNORMAL LOW (ref 3.5–5.0)
Anion gap: 9 (ref 5–15)
BUN: 15 mg/dL (ref 8–23)
CHLORIDE: 96 mmol/L — AB (ref 98–111)
CO2: 25 mmol/L (ref 22–32)
CREATININE: 2.47 mg/dL — AB (ref 0.61–1.24)
Calcium: 7.5 mg/dL — ABNORMAL LOW (ref 8.9–10.3)
GFR calc non Af Amer: 26 mL/min — ABNORMAL LOW (ref >60)
GFR, EST AFRICAN AMERICAN: 30 mL/min — AB (ref >60)
Glucose, Bld: 100 mg/dL — ABNORMAL HIGH (ref 70–99)
Phosphorus: 3.5 mg/dL (ref 2.5–4.6)
Potassium: 3.9 mmol/L (ref 3.5–5.1)
SODIUM: 130 mmol/L — AB (ref 135–145)

## 2018-01-09 LAB — GLUCOSE, CAPILLARY
GLUCOSE-CAPILLARY: 171 mg/dL — AB (ref 70–99)
GLUCOSE-CAPILLARY: 111 mg/dL — AB (ref 70–99)
GLUCOSE-CAPILLARY: 164 mg/dL — AB (ref 70–99)
Glucose-Capillary: 109 mg/dL — ABNORMAL HIGH (ref 70–99)

## 2018-01-09 LAB — COOXEMETRY PANEL
Carboxyhemoglobin: 1.9 % — ABNORMAL HIGH (ref 0.5–1.5)
METHEMOGLOBIN: 1.8 % — AB (ref 0.0–1.5)
O2 Saturation: 48.4 %
TOTAL HEMOGLOBIN: 7.7 g/dL — AB (ref 12.0–16.0)

## 2018-01-09 LAB — MAGNESIUM: Magnesium: 2.3 mg/dL (ref 1.7–2.4)

## 2018-01-09 LAB — AMMONIA: Ammonia: 43 umol/L — ABNORMAL HIGH (ref 9–35)

## 2018-01-09 NOTE — Progress Notes (Signed)
PULMONARY / CRITICAL CARE MEDICINE   Name: Jose Heath MRN: [id number] DOB: 01-13-1951    ADMISSION DATE:  01/01/2018 CONSULTATION DATE: 01/01/18  REFERRING MD: Transfer from [place]: Septic shock  HISTORY OF PRESENT ILLNESS:   [phone] with hx CAD s/p CABG, CHF (EF 25-30%), AICD, DM, Osteomyelitis of Right 1st toe s/p transmetatarsal amputation, Afib (on eliquis), and and CKD Stage 3, presented today to [place] [facility] c/o fatigue and no UOP x 1 day. He was found there to be in septic shock due to UTI +/- Osteo, AKI-on-CKD, and Hyperkalemia. A Central line was placed. Patient was given 2L IVF bolus and started on broad spectrum antibiotics (Vanc and Zosyn) and vasopressors (Levophed and Dobutamine). He was then transferred to [facility] for continued care and initiation of CRRT. Pt also with Biventricular Heart Failure requiring Dobutamine gtt.    SUBJECTIVE:  Awake alert no acute distress. 24 hours off CRRT Remains on dobutamine drip   VITAL SIGNS: BP (!) 106/57   Pulse 70   Temp 97.6 F (36.4 C) (Oral)   Resp 17   Ht 5\' 9"  (1.753 m)   Wt 175 lb 7.8 oz (79.6 kg)   SpO2 98%   BMI 25.91 kg/m   HEMODYNAMICS: CVP:  [5 mmHg-23 mmHg] 18 mmHg  VENTILATOR SETTINGS: FiO2 (%):  [100 %] 100 %  INTAKE / OUTPUT: I/O last 3 completed shifts: In: 2558 [P.O.:1441; I.V.:809.9; IV Piggyback:307.2] Out: 2310 [Urine:30; Other:2280]  PHYSICAL EXAMINATION: General: Disheveled male in no acute distress HEENT: Poor dentition multiple multiple missing teeth, JVD lymphadenopathy is appreciated Neuro: Awake alert follows commands as questions CV: Sounds are regular, no murmur PULM: Decreased breath sounds throughout OH:YWVP, non-tender, bsx4 active  Extremities: Right great toe status post amputation with drainage of poor healing, positive edema Skin: Sacral wound dressing dry intact    LABS:  BMET Recent Labs  Lab 01/07/18 1630  01/08/18 0410 01/09/18 0408  NA 134* 134* 130*  K 3.6 3.7 3.9  CL 98 98 96*  CO2 27 27 25   BUN 7* 6* 15  CREATININE 1.42* 1.37* 2.47*  GLUCOSE 158* 87 100*    Electrolytes Recent Labs  Lab 01/07/18 0349 01/07/18 1630 01/08/18 0410 01/09/18 0408  CALCIUM 7.5* 7.4* 7.4* 7.5*  MG 2.2  --  2.2 2.3  PHOS 3.1 2.8 2.4* 3.5    CBC Recent Labs  Lab 01/07/18 0349 01/08/18 0410 01/09/18 0408  WBC 5.2 4.6 5.6  HGB 7.2* 7.7* 7.6*  HCT 24.3* 25.7* 24.9*  PLT 70* 70* 58*    Coag's Recent Labs  Lab 01/02/18 1719 01/02/18 1935 01/03/18 0245  APTT >200* >200* >200*    Sepsis Markers Recent Labs  Lab 01/04/18 0410 01/05/18 0419 01/06/18 0323  PROCALCITON 0.64 0.52 0.58    ABG Recent Labs  Lab 01/04/18 0145  PHART 7.461*  PCO2ART 32.7  PO2ART 85.5    Liver Enzymes Recent Labs  Lab 01/07/18 1630 01/08/18 0410 01/09/18 0408  ALBUMIN 2.5* 2.5* 2.3*    Cardiac Enzymes Recent Labs  Lab 01/02/18 1024  TROPONINI 0.03*    Glucose Recent Labs  Lab 01/07/18 2230 01/08/18 0734 01/08/18 1116 01/08/18 1529 01/08/18 1754 01/08/18 2217  GLUCAP 101* 101* 157* 158* 154* 78    Imaging No results found.   STUDIES:  Renal Ultrasound 6/28>>> 1. Poorly visualized kidneys secondary to body habitus. No definite hydronephrosis.  2. Findings of cirrhosis as well as a small ascites. 2D echo  6/29>>> EF 20-25%, diffuse hypokinesis, moderate TR, severely reduced systolic function   CULTURES: Blood cultures (6/28) >>> negative 12/09/2017 wound culture>> abundant staph aureus  ANTIBIOTICS: Vancomycin 6/28>>7/5 Zosyn 6/28>>  SIGNIFICANT EVENTS: 6/28: presented to [facility] with c/o fatigue and decreased UOP. Found to be in septic shock with AKI-on-CKD and Hyperkalemia; transferred to [facility] 6/28 CVVHD 7/5 CVVHD discontinued  LINES/TUBES: RIJ TLC [name]) 6/28>>> L Femoral HD catheter 6/29>>   DISCUSSION: 41LKG with hx CAD s/p CABG, CHF  (EF 25-30%), AICD, DM, Osteomyelitis of Right 1st toe s/p transmetatarsal amputation, Afib (on eliquis), and and CKD Stage 3, presented today to [place] [facility] c/o fatigue and no UOP x 1 day. He was found there to be in septic shock due to UTI +/- Osteo, AKI-on-CKD, and Hyperkalemia. A Central line was placed. Patient was given 2L IVF bolus and started on broad spectrum antibiotics (Vanc and Zosyn) and vasopressors (Levophed and Dobutamine). He was then transferred to [facility] for continued care and consideration of dialysis.  Pt with shock (componenet of sepsis & cardiogenic) with Biventricular HF requiring Dobutamine and Levophed, and AKI requiring CRRT.  01/07/2018 extremely agitated during the night required initiation of Precedex and Haldol. 01/09/2018 he is on 4 L nasal cannula, no acute distress, low-dose dobutamine for congestive heart failure, he is being transitioned to intermittent hemodialysis give consideration of transferring to floor.  ASSESSMENT / PLAN:  PULMONARY A: Acute Hypoxic Respiratory Failure r/t decompensated biventricular heart failure with pulmonary edema: Focal density in Lower lungs on CXR 7/2 P: Sats 96% on 4 L nasal cannula Wean O2 as tolerated Pulmonary toilet   CARDIOVASCULAR A: Shock: septic + cardiogenic  Acute decompensated biventricular heart failure  Mod/severe TR  Hx CAD, CHF, Afib P: Currently off all pressors except dobutamine Consider DC  Dobutamine If dobutamine discontinued he was transferred to floor and to [facility] service Considering consulting congestive heart failure team again since he has about this long.      RENAL Lab Results  Component Value Date   CREATININE 2.47 (H) 01/09/2018   CREATININE 1.37 (H) 01/08/2018   CREATININE 1.42 (H) 01/07/2018   Recent Labs  Lab 01/07/18 1630 01/08/18 0410 01/09/18 0408  K 3.6 3.7 3.9    Intake/Output Summary (Last 24 hours) at 01/09/2018 0752 Last data filed at 01/09/2018  0600 Gross per 24 hour  Intake 1695.3 ml  Output 562 ml  Net 1133.3 ml   A: AKI-on-Stage 3 CKD UTI - renal u/s ok  Mild Hyponatremia  Hypokalmia  P: Nephrology following Off CRRT questionable if he will tolerate intermittent hemodialysis Monitor I's and O's Remains on dobutamine  GASTROINTESTINAL A: N/V - resolved  P: Continue renal diet and fluid restriction   HEMATOLOGIC Recent Labs    01/08/18 0410 01/09/18 0408  HGB 7.7* 7.6*    A: Anemia: - no s/s bleeding   Thrombocytopenia, likely in setting of sepsis87->73->70>70->58 P: Monitor for hemorrhage Follow hemoglobin Avoid anticoagulants due to thrombocytopenia  INFECTIOUS A: sepsis UTI  ?osteomyelitis P: Monitor for infectious process No growth cultures today Vancomycin was discontinued 01/08/2018 continue with Zosyn.    SKIN A: Unstageable coccygeal pressure injury Right Great Toe full thickness wound (following previous amputation) P: Wound care following Santyl per protocol  ENDOCRINE CBG (last 3)  Recent Labs    01/08/18 1529 01/08/18 1754 01/08/18 2217  GLUCAP 158* 154* 78    A: DM: Episode of hypoglycemia 01/07/2018>>resolved P: Continue sliding-scale insulin per protocol  NEUROLOGIC  A: Delirium ? Sundowning P: Precedex gtt has been discontinued and is not on 01/09/2018 PRN haldol Continue Seroquel 50mg  qHS  FAMILY  - Updates: 01/09/2018 no family at bedside  - Inter-disciplinary family meet or Palliative Care meeting due by:  01/08/2018   Richardson Landry Etan Vasudevan ACNP [facility] PCCM Pager [phone] till 1 pm If no answer page [phone] 01/09/2018, 7:47 AM

## 2018-01-09 NOTE — Progress Notes (Signed)
S: Sitting up in chair, wants to go home O:BP (!) 110/54   Pulse 70   Temp 98 F (36.7 C) (Oral)   Resp 14   Ht 5\' 9"  (1.753 m)   Wt 79.6 kg (175 lb 7.8 oz)   SpO2 97%   BMI 25.91 kg/m   Intake/Output Summary (Last 24 hours) at 01/09/2018 1139 Last data filed at 01/09/2018 1000 Gross per 24 hour  Intake 1660.32 ml  Output 30 ml  Net 1630.32 ml   Intake/Output: I/O last 3 completed shifts: In: 2718.6 [P.O.:1441; I.V.:827.5; IV Piggyback:450.1] Out: 2310 [Urine:30; Other:2280]  Intake/Output this shift:  Total I/O In: 494.1 [P.O.:473; I.V.:21.1] Out: -  Weight change: -1.8 kg (-3 lb 15.5 oz) DUK:GURKY, cachectic, chronically ill-appearing WM in NAD CVS: no rub, IRR Resp: decreased BS at bases and poor inspiratory effort Abd:+BS, soft, NT Ext: no edema  Recent Labs  Lab 01/05/18 1526 01/06/18 0323 01/06/18 1442 01/07/18 0349 01/07/18 1630 01/08/18 0410 01/09/18 0408  NA 133* 134* 136 133* 134* 134* 130*  K 3.5 3.2* 3.5 3.2* 3.6 3.7 3.9  CL 96* 98 100 99 98 98 96*  CO2 25 24 25 26 27 27 25   GLUCOSE 155* 99 118* 69* 158* 87 100*  BUN 8 8 7* 7* 7* 6* 15  CREATININE 1.24 1.39* 1.43* 1.24 1.42* 1.37* 2.47*  ALBUMIN 2.9* 2.7* 2.7* 2.6* 2.5* 2.5* 2.3*  CALCIUM 7.6* 7.6* 7.8* 7.5* 7.4* 7.4* 7.5*  PHOS 2.6 2.1* 2.0* 3.1 2.8 2.4* 3.5   Liver Function Tests: Recent Labs  Lab 01/07/18 1630 01/08/18 0410 01/09/18 0408  ALBUMIN 2.5* 2.5* 2.3*   No results for input(s): LIPASE, AMYLASE in the last 168 hours. No results for input(s): AMMONIA in the last 168 hours. CBC: Recent Labs  Lab 01/05/18 0419 01/06/18 0323 01/07/18 0349 01/08/18 0410 01/09/18 0408  WBC 7.2 6.0 5.2 4.6 5.6  NEUTROABS  --   --   --  3.6  --   HGB 7.5* 7.1* 7.2* 7.7* 7.6*  HCT 24.7* 23.6* 24.3* 25.7* 24.9*  MCV 98.0 98.3 100.4* 101.6* 100.0  PLT 87* 73* 70* 70* 58*   Cardiac Enzymes: No results for input(s): CKTOTAL, CKMB, CKMBINDEX, TROPONINI in the last 168 hours. CBG: Recent Labs   Lab 01/08/18 1116 01/08/18 1529 01/08/18 1754 01/08/18 2217 01/09/18 0744  GLUCAP 157* 158* 154* 78 109*    Iron Studies: No results for input(s): IRON, TIBC, TRANSFERRIN, FERRITIN in the last 72 hours. Studies/Results: Dg Chest Port 1 View  Result Date: 01/08/2018 CLINICAL DATA:  Agitation.  Respiratory failure. EXAM: PORTABLE CHEST 1 VIEW COMPARISON:  01/06/2018. FINDINGS: Right IJ line stable position. Cardiac pacer in stable position. Prior CABG. Cardiomegaly with diffuse bilateral pulmonary infiltrates/edema and small bilateral pleural effusions. No significant interim change. IMPRESSION: 1.  Right IJ line in stable position. 2. Cardiac pacer in stable position. Prior CABG. Cardiomegaly with diffuse bilateral pulmonary infiltrates/edema and small bilateral pleural effusions. No significant interim change. Electronically Signed   By: Marcello Moores  Register   On: 01/08/2018 05:59   . Chlorhexidine Gluconate Cloth  6 each Topical Daily  . collagenase   Topical BID  . docusate sodium  100 mg Oral BID  . feeding supplement (ENSURE ENLIVE)  237 mL Oral BID BM  . insulin aspart  0-5 Units Subcutaneous QHS  . insulin aspart  0-9 Units Subcutaneous TID WC  . levothyroxine  100 mcg Oral QAC breakfast  . mouth rinse  15 mL Mouth Rinse  BID  . QUEtiapine  50 mg Oral QHS  . sodium chloride flush  10-40 mL Intracatheter Q12H    BMET    Component Value Date/Time   NA 130 (L) 01/09/2018 0408   K 3.9 01/09/2018 0408   CL 96 (L) 01/09/2018 0408   CO2 25 01/09/2018 0408   GLUCOSE 100 (H) 01/09/2018 0408   BUN 15 01/09/2018 0408   CREATININE 2.47 (H) 01/09/2018 0408   CALCIUM 7.5 (L) 01/09/2018 0408   GFRNONAA 26 (L) 01/09/2018 0408   GFRAA 30 (L) 01/09/2018 0408   CBC    Component Value Date/Time   WBC 5.6 01/09/2018 0408   RBC 2.49 (L) 01/09/2018 0408   HGB 7.6 (L) 01/09/2018 0408   HCT 24.9 (L) 01/09/2018 0408   PLT 58 (L) 01/09/2018 0408   MCV 100.0 01/09/2018 0408   MCH 30.5  01/09/2018 0408   MCHC 30.5 01/09/2018 0408   RDW 24.5 (H) 01/09/2018 0408   LYMPHSABS 0.6 (L) 01/08/2018 0410   MONOABS 0.3 01/08/2018 0410   EOSABS 0.1 01/08/2018 0410   BASOSABS 0.0 01/08/2018 0410   Assessment/Plan:  1. OliguricAKI/CKD in setting of septic shock/ARB/Aldactone/Bactrim as well as biventricular CHF. Started on CVVHD 01/01/18 and tolerating it well. Remains pressor dependent and oliguric. 1. Off of CVVHD 7/5/19with rising BUN/Cr but no urgent indication to resume at this time. 2. Off pressors so might be able to transition to IHD in the next 24-48 hours but if not, would recommend transitioning to comfort measures given poor overall prognosis 3. Poor candidate for HD due to poor functional/nutritional status, as well as unstageable coccygeal pressure injury. 4. vanc level supratherapeutic on 01/06/18 2. Shock- sepsis due to UTI +/- cardiogenic. Off levophed and dobutamine per PCCM as well as broad spectrum abx per PCCM. 3. Acute on chronic biventricular CHF- on dobutamine. Low EF of 25-30% at baseline. Tolerating UF with CVVHD. 4. Hyponatremia - due to CHF improved with CVVHD.  Will try lasix to see if he will respond 5. F/E/N- low K and phos treated with KPhos IV and follow 6. Anemia of critical illness- transfuse as needed 7. Osteo/PVD s/p transmet amputationwith poor wound healing 8. Decubitus ulcers- WOC consulted  9. Abnormal LFT's - likely due to shock liver 10. Thrombocytopenia- heparin d/c'd. Possible DIC but need to check HIT panel 11. Moderate protein malnutrition- per primary 12. Disposition- poor overall prognosis given multiple end-stage disease processes and poor functional and nutritional status, now with ARF. Recommend palliative care consultto help set goals/limits of care.   Donetta Potts, MD [employer] [phone]

## 2018-01-09 NOTE — Progress Notes (Signed)
PULMONARY / CRITICAL CARE MEDICINE   Name: Jose Heath MRN: [id number] DOB: [birth year]    ADMISSION DATE:  01/01/2018  REFERRING MD: [name]  CHIEF COMPLAINT: Septic shock  HISTORY OF PRESENT ILLNESS:   67 yo male presented to [facility] with fatigue and oliguria.  Found to have shock from sepsis (UTI, osteomyelitis, coccygeal ulcer) and cardiac disease.  Developed progressive renal failure requiring CRRT.  PAST MEDICAL HISTORY: CAD, s/p CABG, systolic CHF with EF 74%, s/p AICD, DM, Osteomyelitis s/p Rt 1st TMA, A fib on eliquis, CKD 3, Hypothyroidism.  SUBJECTIVE:  Remains on dobutamine.  Still confused.  VITAL SIGNS: BP (!) 102/57 (BP Location: Left Arm)   Pulse 70   Temp 98 F (36.7 C) (Oral)   Resp 20   Ht 5\' 9"  (1.753 m)   Wt 175 lb 7.8 oz (79.6 kg)   SpO2 95%   BMI 25.91 kg/m   HEMODYNAMICS: CVP:  [5 mmHg-23 mmHg] 18 mmHg  INTAKE / OUTPUT: I/O last 3 completed shifts: In: 2718.6 [P.O.:1441; I.V.:827.5; IV Piggyback:450.1] Out: 2310 [Urine:30; Other:2280]  PHYSICAL EXAMINATION:  General - confused Eyes - pupils reactive ENT - poor dentition Cardiac - irregular, no murmur Chest - no wheeze, rales Abd - soft, non tender Ext - no edema Skin - rt foot dressing clean Neuro - moves extremities    LABS:  BMET Recent Labs  Lab 01/07/18 1630 01/08/18 0410 01/09/18 0408  NA 134* 134* 130*  K 3.6 3.7 3.9  CL 98 98 96*  CO2 27 27 25   BUN 7* 6* 15  CREATININE 1.42* 1.37* 2.47*  GLUCOSE 158* 87 100*    Electrolytes Recent Labs  Lab 01/07/18 0349 01/07/18 1630 01/08/18 0410 01/09/18 0408  CALCIUM 7.5* 7.4* 7.4* 7.5*  MG 2.2  --  2.2 2.3  PHOS 3.1 2.8 2.4* 3.5    CBC Recent Labs  Lab 01/07/18 0349 01/08/18 0410 01/09/18 0408  WBC 5.2 4.6 5.6  HGB 7.2* 7.7* 7.6*  HCT 24.3* 25.7* 24.9*  PLT 70* 70* 58*    Coag's Recent Labs  Lab 01/02/18 1719 01/02/18 1935 01/03/18 0245  APTT >200* >200* >200*    Sepsis Markers Recent  Labs  Lab 01/04/18 0410 01/05/18 0419 01/06/18 0323  PROCALCITON 0.64 0.52 0.58    ABG Recent Labs  Lab 01/04/18 0145  PHART 7.461*  PCO2ART 32.7  PO2ART 85.5    Liver Enzymes Recent Labs  Lab 01/07/18 1630 01/08/18 0410 01/09/18 0408  ALBUMIN 2.5* 2.5* 2.3*    Cardiac Enzymes No results for input(s): TROPONINI, PROBNP in the last 168 hours.  Glucose Recent Labs  Lab 01/08/18 0734 01/08/18 1116 01/08/18 1529 01/08/18 1754 01/08/18 2217 01/09/18 0744  GLUCAP 101* 157* 158* 154* 78 109*    Imaging No results found.   STUDIES:  Renal u/s 6/28 >> no hydronephrosis, changes of cirrhosis, ascites Echo 6/29 >> EF 20 to 25%, mod TR  CULTURES: Blood 6/28 >> negative  ANTIBIOTICS: Vancomycin 6/28 >> 7/0 Zosyn 6/28 >>  SIGNIFICANT EVENTS: 6/28 transfer from [place], renal consulted, start CRRT 7/01 cardiology consulted and s/o 7/05 CRRT stopped, off levophed 7/06 d/c dobutamine  LINES/TUBES: Rt IJ CVL [name]) 6/28>>> L Femoral HD catheter 6/29>>  DISCUSSION: 67 yo with septic, cardiogenic shock, AKI, delirium.  Has been able to transition off pressors.  He is off CRRT but not making urine and creatinine up.  Will see if he can tolerate being off dobutamine.  If renal fx doesn't stabilize or improve, then  will need to determine if he can tolerate intermittent HD.  ASSESSMENT / PLAN:  Septic shock. Rt foot osteomyelitis s/p 1st TMA. UTI. Unstageable coccygeal ulcer - present prior to admission. - day 9/10 of zosyn - wound care  Cardiogenic shock. Acute on chronic combined CHF. A fib, CAD. - transition off dobutamine - hold outpt eliquis, coreg, lasix, amiodarone  Acute hypoxic respiratory failure from acute pulmonary edema. - oxygen to keep SpO2 > 92%  Acute renal failure with ATN. CKD 3. Hyponatremia. - monitor renal fx, urine outpt - concerned he will not have renal recover, and will not be able to tolerate IHD  Anemia,  thrombocytopenia of critical illness and chronic disease. - f/u CBC  Cirrhosis of liver. - check ammonia level  Agitated delirium. - seroquel  DM type II. Hypothyroidism. - SSI - synthroid  DVT prophylaxis - SCD SUP - not indicated; d/c pecid Nutrition - renal diet Goals of care - full code  CC time 32 minutes  Chesley Mires, MD [place] 01/09/2018, 11:12 AM

## 2018-01-10 ENCOUNTER — Inpatient Hospital Stay (HOSPITAL_COMMUNITY): Payer: Medicare HMO

## 2018-01-10 DIAGNOSIS — E440 Moderate protein-calorie malnutrition: Secondary | ICD-10-CM

## 2018-01-10 DIAGNOSIS — I5023 Acute on chronic systolic (congestive) heart failure: Secondary | ICD-10-CM

## 2018-01-10 DIAGNOSIS — J811 Chronic pulmonary edema: Secondary | ICD-10-CM

## 2018-01-10 DIAGNOSIS — R410 Disorientation, unspecified: Secondary | ICD-10-CM

## 2018-01-10 DIAGNOSIS — Z7189 Other specified counseling: Secondary | ICD-10-CM

## 2018-01-10 DIAGNOSIS — L89151 Pressure ulcer of sacral region, stage 1: Secondary | ICD-10-CM

## 2018-01-10 DIAGNOSIS — K7460 Unspecified cirrhosis of liver: Secondary | ICD-10-CM

## 2018-01-10 DIAGNOSIS — Z515 Encounter for palliative care: Secondary | ICD-10-CM

## 2018-01-10 LAB — BASIC METABOLIC PANEL
Anion gap: 11 (ref 5–15)
BUN: 27 mg/dL — AB (ref 8–23)
CALCIUM: 7.8 mg/dL — AB (ref 8.9–10.3)
CHLORIDE: 93 mmol/L — AB (ref 98–111)
CO2: 24 mmol/L (ref 22–32)
Creatinine, Ser: 3.72 mg/dL — ABNORMAL HIGH (ref 0.61–1.24)
GFR calc non Af Amer: 16 mL/min — ABNORMAL LOW (ref >60)
GFR, EST AFRICAN AMERICAN: 18 mL/min — AB (ref >60)
Glucose, Bld: 128 mg/dL — ABNORMAL HIGH (ref 70–99)
Potassium: 4.1 mmol/L (ref 3.5–5.1)
SODIUM: 128 mmol/L — AB (ref 135–145)

## 2018-01-10 LAB — GLUCOSE, CAPILLARY
GLUCOSE-CAPILLARY: 132 mg/dL — AB (ref 70–99)
Glucose-Capillary: 128 mg/dL — ABNORMAL HIGH (ref 70–99)
Glucose-Capillary: 217 mg/dL — ABNORMAL HIGH (ref 70–99)
Glucose-Capillary: 79 mg/dL (ref 70–99)

## 2018-01-10 LAB — CBC WITH DIFFERENTIAL/PLATELET
BASOS ABS: 0 10*3/uL (ref 0.0–0.1)
BASOS PCT: 0 %
EOS ABS: 0.1 10*3/uL (ref 0.0–0.7)
Eosinophils Relative: 2 %
HCT: 23 % — ABNORMAL LOW (ref 39.0–52.0)
Hemoglobin: 7 g/dL — ABNORMAL LOW (ref 13.0–17.0)
LYMPHS ABS: 0.6 10*3/uL — AB (ref 0.7–4.0)
LYMPHS PCT: 11 %
MCH: 30.2 pg (ref 26.0–34.0)
MCHC: 30.4 g/dL (ref 30.0–36.0)
MCV: 99.1 fL (ref 78.0–100.0)
MONO ABS: 0.6 10*3/uL (ref 0.1–1.0)
Monocytes Relative: 11 %
NEUTROS ABS: 3.9 10*3/uL (ref 1.7–7.7)
Neutrophils Relative %: 76 %
PLATELETS: 51 10*3/uL — AB (ref 150–400)
RBC: 2.32 MIL/uL — ABNORMAL LOW (ref 4.22–5.81)
RDW: 24.6 % — AB (ref 11.5–15.5)
WBC: 5.2 10*3/uL (ref 4.0–10.5)

## 2018-01-10 LAB — PHOSPHORUS: Phosphorus: 5 mg/dL — ABNORMAL HIGH (ref 2.5–4.6)

## 2018-01-10 LAB — MAGNESIUM: MAGNESIUM: 2.5 mg/dL — AB (ref 1.7–2.4)

## 2018-01-10 LAB — HEPARIN INDUCED PLATELET AB (HIT ANTIBODY): HEPARIN INDUCED PLT AB: 0.403 {OD_unit} — AB (ref 0.000–0.400)

## 2018-01-10 NOTE — Progress Notes (Addendum)
PULMONARY / CRITICAL CARE MEDICINE   Name: Jose Heath MRN: [id number] DOB: 1951/03/04    ADMISSION DATE:  01/01/2018 CONSULTATION DATE: 01/01/18  REFERRING MD: Transfer from [place]: Septic shock  HISTORY OF PRESENT ILLNESS:   [phone] with hx CAD s/p CABG, CHF (EF 25-30%), AICD, DM, Osteomyelitis of Right 1st toe s/p transmetatarsal amputation, Afib (on eliquis), and and CKD Stage 3, presented today to [place] [facility] c/o fatigue and no UOP x 1 day. He was found there to be in septic shock due to UTI +/- Osteo, AKI-on-CKD, and Hyperkalemia. A Central line was placed. Patient was given 2L IVF bolus and started on broad spectrum antibiotics (Vanc and Zosyn) and vasopressors (Levophed and Dobutamine). He was then transferred to [facility] for continued care and initiation of CRRT. Pt also with Biventricular Heart Failure requiring Dobutamine gtt.    SUBJECTIVE:  Awake alert, follows commands, dobutamine drip is been off for 16 hours.   VITAL SIGNS: BP (!) 89/61   Pulse 70   Temp 97.9 F (36.6 C) (Oral)   Resp 13   Ht 5\' 9"  (1.753 m)   Wt 182 lb 12.2 oz (82.9 kg)   SpO2 93%   BMI 26.99 kg/m   HEMODYNAMICS: CVP:  [19 mmHg] 19 mmHg  VENTILATOR SETTINGS:    INTAKE / OUTPUT: I/O last 3 completed shifts: In: 2093.9 [P.O.:1225; I.V.:525.9; IV Piggyback:343] Out: 20 [Urine:20]  PHYSICAL EXAMINATION: General: Mildly disorientated, wants to get up and walk, wants to go home. HEENT: No JVD or lymphadenopathy is appreciated Neuro: Mild confusion orientated x2 CV: Sounds are distant PULM: even/non-labored, lungs bilaterally diminished WC:BJSE, non-tender, bsx4 active  Extremities: warm/dry, 1+ edema, right great toe is status post partial amputation skin is dry and intact Skin: no rashes or lesions, sacral dressing is intact, he has multiple areas of ecchymosis of the extremities.     LABS:  BMET Recent Labs  Lab  01/08/18 0410 01/09/18 0408 01/10/18 0445  NA 134* 130* 128*  K 3.7 3.9 4.1  CL 98 96* 93*  CO2 27 25 24   BUN 6* 15 27*  CREATININE 1.37* 2.47* 3.72*  GLUCOSE 87 100* 128*    Electrolytes Recent Labs  Lab 01/08/18 0410 01/09/18 0408 01/10/18 0445  CALCIUM 7.4* 7.5* 7.8*  MG 2.2 2.3 2.5*  PHOS 2.4* 3.5 5.0*    CBC Recent Labs  Lab 01/08/18 0410 01/09/18 0408 01/10/18 0445  WBC 4.6 5.6 5.2  HGB 7.7* 7.6* 7.0*  HCT 25.7* 24.9* 23.0*  PLT 70* 58* 51*    Coag's No results for input(s): APTT, INR in the last 168 hours.  Sepsis Markers Recent Labs  Lab 01/04/18 0410 01/05/18 0419 01/06/18 0323  PROCALCITON 0.64 0.52 0.58    ABG Recent Labs  Lab 01/04/18 0145  PHART 7.461*  PCO2ART 32.7  PO2ART 85.5    Liver Enzymes Recent Labs  Lab 01/07/18 1630 01/08/18 0410 01/09/18 0408  ALBUMIN 2.5* 2.5* 2.3*    Cardiac Enzymes No results for input(s): TROPONINI, PROBNP in the last 168 hours.  Glucose Recent Labs  Lab 01/08/18 2217 01/09/18 0744 01/09/18 1143 01/09/18 1724 01/09/18 2125 01/10/18 0723  GLUCAP 78 109* 171* 111* 164* 128*    Imaging Dg Chest Port 1 View  Result Date: 01/10/2018 CLINICAL DATA:  Respiratory failure. EXAM: PORTABLE CHEST 1 VIEW COMPARISON:  01/08/2018. FINDINGS: Unchanged cardiac silhouette, and dual lead pacer. Central venous catheter device from RIGHT IJ approach lies at the  cavoatrial junction. BILATERAL pulmonary opacities, likely edema, stable. IMPRESSION: Stable chest. Electronically Signed   By: Staci Righter M.D.   On: 01/10/2018 07:20     STUDIES:  Renal Ultrasound 6/28>>> 1. Poorly visualized kidneys secondary to body habitus. No definite hydronephrosis.  2. Findings of cirrhosis as well as a small ascites. 2D echo 6/29>>> EF 20-25%, diffuse hypokinesis, moderate TR, severely reduced systolic function   CULTURES: Blood cultures (6/28) >>> negative 12/09/2017 wound culture>> abundant staph  aureus  ANTIBIOTICS: Vancomycin 6/28>>7/5 Zosyn 6/28>>  SIGNIFICANT EVENTS: 6/28: presented to [facility] with c/o fatigue and decreased UOP. Found to be in septic shock with AKI-on-CKD and Hyperkalemia; transferred to [facility] 6/28 CVVHD 7/5 CVVHD discontinued  LINES/TUBES: RIJ TLC [name]) 6/28>>> L Femoral HD catheter 6/29>>   DISCUSSION: [id number] with hx CAD s/p CABG, CHF (EF 25-30%), AICD, DM, Osteomyelitis of Right 1st toe s/p transmetatarsal amputation, Afib (on eliquis), and and CKD Stage 3, presented today to [place] center c/o fatigue and no UOP x 1 day. He was found there to be in septic shock due to UTI +/- Osteo, AKI-on-CKD, and Hyperkalemia. A Central line was placed. Patient was given 2L IVF bolus and started on broad spectrum antibiotics (Vanc and Zosyn) and vasopressors (Levophed and Dobutamine). He was then transferred to [facility] for continued care and consideration of dialysis.  Pt with shock (componenet of sepsis & cardiogenic) with Biventricular HF requiring Dobutamine and Levophed, and AKI requiring CRRT.  01/07/2018 extremely agitated during the night required initiation of Precedex and Haldol. 01/09/2018 he is on 4 L nasal cannula, no acute distress, low-dose dobutamine for congestive heart failure, he is being transitioned to intermittent hemodialysis give consideration of transferring to floor. 01/10/2018 on nasal cannula, off dobutamine, intermittent hemodialysis, transfer to stepdown unit and to [facility] as of 01/11/2018.  ASSESSMENT / PLAN:  PULMONARY A: Acute Hypoxic Respiratory Failure r/t decompensated biventricular heart failure with pulmonary edema: Focal density in Lower lungs on CXR 7/2 P: Wean O2 as able for sats less than 95% No acute distress on 01/10/2018   CARDIOVASCULAR A: Shock: septic + cardiogenic  Acute decompensated biventricular heart failure  Mod/severe TR  Hx CAD, CHF, Afib P: 24 hours of dobutamine He is  hemodynamically stable      RENAL Lab Results  Component Value Date   CREATININE 3.72 (H) 01/10/2018   CREATININE 2.47 (H) 01/09/2018   CREATININE 1.37 (H) 01/08/2018   Recent Labs  Lab 01/08/18 0410 01/09/18 0408 01/10/18 0445  K 3.7 3.9 4.1    Intake/Output Summary (Last 24 hours) at 01/10/2018 0810 Last data filed at 01/10/2018 0700 Gross per 24 hour  Intake 1569.92 ml  Output 20 ml  Net 1549.92 ml   A: AKI-on-Stage 3 CKD UTI - renal u/s ok  Mild Hyponatremia  Hypokalmia  P: Dobutamine is off x24 hours Nephrology is following Intermittent hemodialysis as needed per nephrology  GASTROINTESTINAL A: N/V - resolved  P: Continue renal diet   HEMATOLOGIC Recent Labs    01/09/18 0408 01/10/18 0445  HGB 7.6* 7.0*    A: Anemia: - no s/s bleeding   Thrombocytopenia, likely in setting of sepsis87->73->70>70->58 P: Monitor hemoglobin Assess for any bleeding She is for protocol  INFECTIOUS A: sepsis UTI  ?osteomyelitis P: Monitor white count fever current Continue Zosyn day 2    SKIN A: Unstageable coccygeal pressure injury Right Great Toe full thickness wound (following previous amputation) P: Wound care is followed Continue current treatment  ENDOCRINE CBG (last 3)  Recent Labs    01/09/18 1724 01/09/18 2125 01/10/18 0723  GLUCAP 111* 164* 128*    A: DM: Episode of hypoglycemia 01/07/2018>>resolved P: CBGs are well controlled continue sliding scale insulin per protocol  NEUROLOGIC A: Delirium ? Sundowning P: Orientated x2 Remains on Seroquel Other sedation has been discontinued 01/10/2018 order physical therapy this will help with this movement and also with disorientation. FAMILY  - Updates: 01/10/2018 no family bedside  - Inter-disciplinary family meet or Palliative Care meeting due by:  01/08/2018    01/10/2018 have asked try to pick up patient in a.m. of 01/11/2018.  Try team is aware.  Richardson Landry Minor ACNP Maryanna Shape  PCCM Pager [phone] till 1 pm If no answer page [phone] 01/10/2018, 8:10 AM
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
## 2018-01-10 NOTE — Consult Note (Signed)
Consultation Note Date: 01/10/2018   Patient Name: Jose Heath  DOB: [birth year]  MRN: [id number]  Age / Sex: 67 y.o., male  PCP: Patient, No Pcp Per Referring Physician: Chesley Mires, MD  Reason for Consultation: Establishing goals of care  HPI/Patient Profile: 67 y.o. male admitted on 01/01/2018 from [facility] with complaints of fatigue and no urine output x1day. He has a past medical history significant for AICD, diabetes, A-fib (on eliquis), CKD stage 3, Osteomyelitis of right first toe s/p transmetatarsal amputation, CHF (EF 25-30%), and CAD s/p CABG. Since admission was found to be in septic shock due to UTI +/- Osteo, AKI-on-CKD, and Hyperkalemia. A Central line was placed. Patient was given 2L IVF bolus and started on broad spectrum antibiotics (Vanc and Zosyn) and vasopressors (Levophed and Dobutamine). He was transferred to [place] here at [facility] for continued care and consideration of dialysis. He is now being followed by Nephrology. He was started on CVVHD on 6/28 and came off on 7/5. BUN/CR continues to increase despite discontinuation of pressors. Palliative Medicine consulted for goals of care discussion.    Clinical Assessment and Goals of Care: I have reviewed medical records including lab results, imaging, Epic notes, and MAR, received report from the bedside RN, and assessed the patient. I then met at the bedside with patient's son Jose Heath  to discuss diagnosis prognosis, [place], EOL wishes, disposition and options. Patient is confused and exhibiting agitation. He is unable to participate in goals of care discussion.   I introduced Palliative Medicine as specialized medical care for people living with serious illness. It focuses on providing relief from the symptoms and stress of a serious illness. The goal is to improve quality of life for both the patient and the family.  We  discussed a brief life review of the patient. 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 He has only one son and 3 grandsons. He is a man of [place]. Son states he loved drag racing. He used to race and most recently due to health would only watch on tv or attend an actual race if possible. He is also very close with his 4 sisters.   As far as functional and nutritional status. Son states he has continued to decline in health since March. Prior to March and having his toe amputated he was ambulatory and performed all ADLs independently.  Son states health has declined more severely over the past 60 days with more of a change over the past 3 weeks.  Son states he began falling and noticed a decrease in appetite.  Patient resides with his sister who has lived with for many many years now.  States he became more weaker and also had a decrease in appetite.  Son states he only has good hearing out of his left ear.  He also noticed a change in his behavior such as saying yes to almost anything even if a phrase was inappropriate.  We discussed  his current illness and what it means in the larger context of his on-going co-morbidities.  Natural disease trajectory and expectations at EOL were discussed.  Son verbalizes awareness that patient is continuing to decline, however he feels that he should continue to give his father a fighting chance.  Discussed the possibility of not being able to transition to to IHD.  Son states he is aware that he is not a candidate for long-term HD.  He also states he is aware that his heart is not in the best condition.  I attempted to elicit values and goals of care important to the patient.    The difference between aggressive medical intervention and comfort care was considered in light of the patient's goals of care.  Son states that this is a lot of information and he feels like he is giving up on his father if he does not  continue with aggressive medical interventions.  Discussed in details the difference between aggressive and comfort care.  He verbalized understanding.  Encouraged son to think about what his father's wishes would be if he was able to appropriately discuss his current medical condition with him.  Son seems very torn with making decisions and continues to state I can help him die.  Again discussed with son that he was not "helping him die "but only carrying out wishes that he knows his father would want, and evaluate him his quality of life and what is most important to them both.  Son became very tearful during this part of the conversation and states "I know my father would not want to live like this but I know our last conversation was he wanted to live ".  Support was given.  Son states that patient was very close with his sisters and he would like to at least have a conversation with them regarding his current medical status and allow them to offer input before making any definitive decisions.  Advanced directives, concepts specific to code status, artifical feeding and hydration, and rehospitalization were considered and discussed.  Again son states he would like his father to be a full code because God is the ultimate decision maker and he does not feel like he has the right to make this decision.  When asked to elaborate on what decision he was referring to he stated "the decision to not let him continue to live ".  Support given and I explained to the son he is not making a decision to not allow his father to continue to live, however he is making a decision based on a what he feels his father would want and a decision to allow him to live and have a peaceful end-of-life transition.  I also discussed with the son that if he felt that God was the ultimate decision maker and he did not want any interventions of his works, that he should also consider if the patient was to pass away at this point we would  artificially be interfering with God's plan because the patient would have died and we would be intervening with the goal of reviving him from death.  Son became tearful and stated he never looked at it that way.  States now that he thinks about it that was a powerful statement and agrees that medical interventions would be altering the natural process of death.  He states he would still like to have a conversation with the sisters before making the final decision of DNR/DNI.  Hospice and   Palliative Care services outpatient were explained and offered.  Discussed with the saline options regarding outpatient services depending on which path he chills for his father.  Son was very Patent attorney and states he knows his father would need to be placed into a residential hospice facility once he has chosen the comfort measures path.  He states the family would not be able to emotionally care for him in the home knowing he passed away there.  Questions and concerns were addressed.  Hard Choices booklet left for review. The family was encouraged to call with questions or concerns.  PMT will continue to support holistically.  NEXT OF KIN-Brian Khader (son)    SUMMARY OF RECOMMENDATIONS    Full code-son is planning to discuss CODE STATUS with patient's sisters.  He states when they have made a decision he will call and let someone know.  He plans to discuss with them this afternoon or first thing tomorrow.  He is leaning towards a DNR/DNI, but continues to want  input from patient's sisters.  Continue to treat the treatable.  Son is hopeful patient will be able to receive an additional HD, with hopes of improving his mental status and allowing family members to have at least one more lucid conversation with him and allow them to tell him that they love them.  Son was advised this may not be an option and or patient's mental status may not improve.  He wants to also wait to hear from nephrology and their plan of  care for him.  He is hopeful for the best but is prepared for the worst.  If son decides to proceed with DNR/DNI and comfort measures patient would be a candidate for residential hospice facility.  Palliative care team will continue to support patient, patient's family, and medical team during hospitalization.  Code Status/Advance Care Planning:  Full code  Palliative Prophylaxis:   Aspiration, Bowel Regimen, Delirium Protocol, Eye Care, Frequent Pain Assessment, Oral Care and Turn Reposition  Additional Recommendations (Limitations, Scope, Preferences):  Full Scope Treatment  Psycho-social/Spiritual:   Desire for further Chaplaincy support:no  Prognosis:   Guarded to poor in the setting of AKI, chronic kidney disease, poor p.o. intake, immobility, acute on chronic biventricular CHF, EF 25 -30%, altered mental status, shock, anemia, osteomyelitis status post amputation with poor wound healing, decubitus ulcers, moderate protein malnutrition, and poor functional status.  Discharge Planning: To Be Determined      Primary Diagnoses: Present on Admission: . Septic shock ([place])   I have reviewed the medical record, interviewed the patient and family, and examined the patient. The following aspects are pertinent.  Past Medical History:  Diagnosis Date  . AICD (automatic cardioverter/defibrillator) present   . Anemia   . Atrial fibrillation ([place])   . Cardiomyopathy ([place])   . CHF (congestive heart failure) ([place])   . Chronic kidney disease   . Coronary artery disease   . Diabetes mellitus without complication ([place])   . Hypothyroidism   . Myocardial infarction ([place])   . Osteomyelitis of foot ([place])   . Thrombocytopenia ([place])    Social History   Socioeconomic History  . Marital status: Single    Spouse name: Not on file  . Number of children: Not on file  . Years of education: Not on file  . Highest education level: Not on file  Occupational History  . Not on file    Social Needs  . Financial resource strain: Not on file  . Food insecurity:  Worry: Not on file    Inability: Not on file  . Transportation needs:    Medical: Not on file    Non-medical: Not on file  Tobacco Use  . Smoking status: Never Smoker  . Smokeless tobacco: Never Used  Substance and Sexual Activity  . Alcohol use: Not on file  . Drug use: Never  . Sexual activity: Not Currently    Birth control/protection: None  Lifestyle  . Physical activity:    Days per week: Not on file    Minutes per session: Not on file  . Stress: Not on file  Relationships  . Social connections:    Talks on phone: Not on file    Gets together: Not on file    Attends religious service: Not on file    Active member of club or organization: Not on file    Attends meetings of clubs or organizations: Not on file    Relationship status: Not on file  Other Topics Concern  . Not on file  Social History Narrative  . Not on file   History reviewed. No pertinent family history. Scheduled Meds: . Chlorhexidine Gluconate Cloth  6 each Topical Daily  . collagenase   Topical BID  . docusate sodium  100 mg Oral BID  . feeding supplement (ENSURE ENLIVE)  237 mL Oral BID BM  . insulin aspart  0-5 Units Subcutaneous QHS  . insulin aspart  0-9 Units Subcutaneous TID WC  . levothyroxine  100 mcg Oral QAC breakfast  . mouth rinse  15 mL Mouth Rinse BID  . QUEtiapine  50 mg Oral QHS  . sodium chloride flush  10-40 mL Intracatheter Q12H   Continuous Infusions: . sodium chloride 10 mL/hr at 01/10/18 1400  . piperacillin-tazobactam (ZOSYN)  IV Stopped (01/10/18 1211)   PRN Meds:.sodium chloride, acetaminophen, albuterol, Gerhardt's butt cream, haloperidol lactate, ondansetron (ZOFRAN) IV, sodium chloride flush Medications Prior to Admission:  Prior to Admission medications   Medication Sig Start Date End Date Taking? Authorizing Provider  acetaminophen (TYLENOL) 500 MG tablet Take 500 mg by mouth every 6  (six) hours as needed.   Yes [provider]  acetaminophen-codeine (TYLENOL #3) 300-30 MG tablet Take 2 tablets by mouth every 6 (six) hours as needed for pain. 12/25/17  Yes [provider]  albuterol (PROVENTIL HFA;VENTOLIN HFA) 108 (90 Base) MCG/ACT inhaler Inhale 1-2 puffs into the lungs every 4 (four) hours as needed for shortness of breath. 11/19/17  Yes [provider]  apixaban (ELIQUIS) 5 MG TABS tablet Take 5 mg by mouth. 12/06/15  Yes [provider]  bethanechol (URECHOLINE) 25 MG tablet Take 25 mg by mouth 2 (two) times daily. 12/23/17  Yes [provider]  carvedilol (COREG) 3.125 MG tablet Take 6.25 mg by mouth 2 (two) times daily.  08/12/16  Yes [provider]  furosemide (LASIX) 40 MG tablet Take 60 mg by mouth daily.  07/28/16  Yes [provider]  IRON PO Take 325 mg by mouth.   Yes [provider]  levothyroxine (SYNTHROID, LEVOTHROID) 100 MCG tablet Take 100 mcg by mouth every morning. 08/14/16  Yes [provider]  metFORMIN (GLUCOPHAGE) 500 MG tablet Take 500 mg by mouth daily.  09/09/16  Yes [provider]  QVAR REDIHALER 40 MCG/ACT inhaler Inhale 2 puffs into the lungs 2 (two) times daily. 12/21/17  Yes [provider]  silodosin (RAPAFLO) 8 MG CAPS capsule Take 8 mg by mouth daily after supper. 12/23/17  Yes [provider]  amiodarone (PACERONE) 200 MG tablet Take 200 mg by mouth daily. 08/12/16   [provider]  INVANZ 1 g injection See admin instructions. 07/14/16   [provider]  NONFORMULARY OR COMPOUNDED ITEM VANCO/TOB/VORI 1000-300-200MG POWDER 12/25/17   [provider]   No Known Allergies Review of Systems  Unable to perform ROS: Mental status change    Physical Exam  Constitutional: He appears cachectic. He is uncooperative. He appears ill.  Aggressive verbiage   Cardiovascular: Regular rhythm and normal heart sounds. Exam reveals  decreased pulses.  Pulmonary/Chest: Effort normal. He has decreased breath sounds.  Musculoskeletal:  Generalized weakness, great toe amputation   Neurological: He is alert. He is disoriented.  Psychiatric: His speech is normal. He is aggressive. Cognition and memory are impaired. He expresses inappropriate judgment.  Nursing note and vitals reviewed.   Vital Signs: BP 102/68   Pulse 71   Temp 98 F (36.7 C) (Oral)   Resp 19   Ht 5' 9" (1.753 m)   Wt 82.9 kg (182 lb 12.2 oz)   SpO2 95%   BMI 26.99 kg/m  Pain Scale: 0-10   Pain Score: 0-No pain   SpO2: SpO2: 95 % O2 Device:SpO2: 95 % O2 Flow Rate: .O2 Flow Rate (L/min): 3 L/min  IO: Intake/output summary:   Intake/Output Summary (Last 24 hours) at 01/10/2018 1544 Last data filed at 01/10/2018 1400 Gross per 24 hour  Intake 1182.72 ml  Output 0 ml  Net 1182.72 ml    LBM: Last BM Date: 01/09/18 Baseline Weight: Weight: 98.9 kg (218 lb 0.6 oz) Most recent weight: Weight: 82.9 kg (182 lb 12.2 oz)     Palliative Assessment/Data: PPS 30%   Time In: 1345 Time Out: 1515 Time Total: 90 min Greater than 50%  of this time was spent counseling and coordinating care related to the above assessment and plan.  Signed by:  Nikki Pickenpack-Cousar, NP-BC Palliative Medicine Team  Phone: [phone] Fax: [phone] Pager: [phone] Amion: N. Cousar    Please contact Palliative Medicine Team phone at [phone] for questions and concerns.  For individual provider: See Amion             

## 2018-01-10 NOTE — Plan of Care (Signed)
  Problem: Activity: Goal: Risk for activity intolerance will decrease Outcome: Progressing   Problem: Activity: Goal: Activity intolerance will improve Outcome: Progressing   

## 2018-01-10 NOTE — Progress Notes (Signed)
S: More agitated and confused this am O:BP 97/64   Pulse 73   Temp 98 F (36.7 C) (Oral)   Resp (!) 24   Ht 5\' 9"  (1.753 m)   Wt 82.9 kg (182 lb 12.2 oz)   SpO2 92%   BMI 26.99 kg/m   Intake/Output Summary (Last 24 hours) at 01/10/2018 1148 Last data filed at 01/10/2018 1000 Gross per 24 hour  Intake 1367.02 ml  Output 20 ml  Net 1347.02 ml   Intake/Output: I/O last 3 completed shifts: In: 2093.9 [P.O.:1225; I.V.:525.9; IV Piggyback:343] Out: 20 [Urine:20]  Intake/Output this shift:  Total I/O In: 330 [P.O.:300; I.V.:30] Out: 0  Weight change: 3.3 kg (7 lb 4.4 oz) Gen: frail, cachectic WM CVS: no rub Resp: decreased BS at bases Abd: +BS, soft, NT/ND Ext: trace edema, s/p right great toe partial amupation  Recent Labs  Lab 01/05/18 1526 01/06/18 0323 01/06/18 1442 01/07/18 0349 01/07/18 1630 01/08/18 0410 01/09/18 0408 01/10/18 0445  NA 133* 134* 136 133* 134* 134* 130* 128*  K 3.5 3.2* 3.5 3.2* 3.6 3.7 3.9 4.1  CL 96* 98 100 99 98 98 96* 93*  CO2 25 24 25 26 27 27 25 24   GLUCOSE 155* 99 118* 69* 158* 87 100* 128*  BUN 8 8 7* 7* 7* 6* 15 27*  CREATININE 1.24 1.39* 1.43* 1.24 1.42* 1.37* 2.47* 3.72*  ALBUMIN 2.9* 2.7* 2.7* 2.6* 2.5* 2.5* 2.3*  --   CALCIUM 7.6* 7.6* 7.8* 7.5* 7.4* 7.4* 7.5* 7.8*  PHOS 2.6 2.1* 2.0* 3.1 2.8 2.4* 3.5 5.0*   Liver Function Tests: Recent Labs  Lab 01/07/18 1630 01/08/18 0410 01/09/18 0408  ALBUMIN 2.5* 2.5* 2.3*   No results for input(s): LIPASE, AMYLASE in the last 168 hours. Recent Labs  Lab 01/09/18 1147  AMMONIA 43*   CBC: Recent Labs  Lab 01/06/18 0323 01/07/18 0349 01/08/18 0410 01/09/18 0408 01/10/18 0445  WBC 6.0 5.2 4.6 5.6 5.2  NEUTROABS  --   --  3.6  --  3.9  HGB 7.1* 7.2* 7.7* 7.6* 7.0*  HCT 23.6* 24.3* 25.7* 24.9* 23.0*  MCV 98.3 100.4* 101.6* 100.0 99.1  PLT 73* 70* 70* 58* 51*   Cardiac Enzymes: No results for input(s): CKTOTAL, CKMB, CKMBINDEX, TROPONINI in the last 168 hours. CBG: Recent  Labs  Lab 01/09/18 0744 01/09/18 1143 01/09/18 1724 01/09/18 2125 01/10/18 0723  GLUCAP 109* 171* 111* 164* 128*    Iron Studies: No results for input(s): IRON, TIBC, TRANSFERRIN, FERRITIN in the last 72 hours. Studies/Results: Dg Chest Port 1 View  Result Date: 01/10/2018 CLINICAL DATA:  Respiratory failure. EXAM: PORTABLE CHEST 1 VIEW COMPARISON:  01/08/2018. FINDINGS: Unchanged cardiac silhouette, and dual lead pacer. Central venous catheter device from RIGHT IJ approach lies at the cavoatrial junction. BILATERAL pulmonary opacities, likely edema, stable. IMPRESSION: Stable chest. Electronically Signed   By: Staci Righter M.D.   On: 01/10/2018 07:20   . Chlorhexidine Gluconate Cloth  6 each Topical Daily  . collagenase   Topical BID  . docusate sodium  100 mg Oral BID  . feeding supplement (ENSURE ENLIVE)  237 mL Oral BID BM  . insulin aspart  0-5 Units Subcutaneous QHS  . insulin aspart  0-9 Units Subcutaneous TID WC  . levothyroxine  100 mcg Oral QAC breakfast  . mouth rinse  15 mL Mouth Rinse BID  . QUEtiapine  50 mg Oral QHS  . sodium chloride flush  10-40 mL Intracatheter Q12H  BMET    Component Value Date/Time   NA 128 (L) 01/10/2018 0445   K 4.1 01/10/2018 0445   CL 93 (L) 01/10/2018 0445   CO2 24 01/10/2018 0445   GLUCOSE 128 (H) 01/10/2018 0445   BUN 27 (H) 01/10/2018 0445   CREATININE 3.72 (H) 01/10/2018 0445   CALCIUM 7.8 (L) 01/10/2018 0445   GFRNONAA 16 (L) 01/10/2018 0445   GFRAA 18 (L) 01/10/2018 0445   CBC    Component Value Date/Time   WBC 5.2 01/10/2018 0445   RBC 2.32 (L) 01/10/2018 0445   HGB 7.0 (L) 01/10/2018 0445   HCT 23.0 (L) 01/10/2018 0445   PLT 51 (L) 01/10/2018 0445   MCV 99.1 01/10/2018 0445   MCH 30.2 01/10/2018 0445   MCHC 30.4 01/10/2018 0445   RDW 24.6 (H) 01/10/2018 0445   LYMPHSABS 0.6 (L) 01/10/2018 0445   MONOABS 0.6 01/10/2018 0445   EOSABS 0.1 01/10/2018 0445   BASOSABS 0.0 01/10/2018 0445      Assessment/Plan:  1. OliguricAKI/CKD in setting of septic shock/ARB/Aldactone/Bactrim as well as biventricular CHF. Started on CVVHD 01/01/18 and tolerating it well. Remains oliguric despite being taken off of pressors. 1. Off of CVVHD 7/5/19with rising BUN/Cr but no urgent indication to resume at this time. 2. Off pressors so might be able to transition to IHD in the next 24-48 hours but if not, would recommend transitioning to comfort measures given poor overall prognosis 3. Poor candidate for HD due to poor functional/nutritional status, as well as unstageable coccygeal pressure injury. 4. vanc level supratherapeutic on 01/06/18 2. Shock- sepsis due to UTI +/- cardiogenic. Offlevophed and dobutamine per PCCM. 1. broad spectrum abx per PCCM (vanc/zosyn). 3. Acute on chronic biventricular CHF- on dobutamine. Low EF of 25-30% at baseline. Tolerating UF with CVVHD. 4. Hyponatremia - due to CHF improved with CVVHD.  Will try lasix to see if he will respond 5. F/E/N- low K and phostreated withKPhos IV and follow 6. Anemia of critical illness- transfuse as needed 7. Osteo/PVD s/p transmet amputationwith poor wound healing 8. Decubitus ulcers- WOC consulted  9. Abnormal LFT's - likely due to shock liver 10. Thrombocytopenia- heparin d/c'd. Possible DIC but need to check HIT panel 11. Moderate protein malnutrition- per primary 12. Disposition- poor overall prognosis given multiple end-stage disease processes and poor functional and nutritional status, now with ARF. Recommend palliative care consultto help set goals/limits of care.   Donetta Potts, MD [employer] [phone]

## 2018-01-10 NOTE — Progress Notes (Signed)
Patient transferred from 18M to 5M01. Telemetry box 5M01 placed on patient. Ventricular pacing noted. Skin assessed by 2 RNs. Multiple skin tears on bilateral upper extremities with foam dressings. Foam dressing to right shin with open area about 1 cm in diameter. Dressing to right foot ray 1 and 2 amputations assessed. Noted pressure injury to sacrum with guaze dressing in place. VSS. Will continue to monitor. Bartholomew Crews, RN

## 2018-01-11 ENCOUNTER — Inpatient Hospital Stay (HOSPITAL_COMMUNITY): Payer: Medicare HMO

## 2018-01-11 DIAGNOSIS — N189 Chronic kidney disease, unspecified: Secondary | ICD-10-CM

## 2018-01-11 DIAGNOSIS — I5082 Biventricular heart failure: Secondary | ICD-10-CM

## 2018-01-11 LAB — GLUCOSE, CAPILLARY
GLUCOSE-CAPILLARY: 158 mg/dL — AB (ref 70–99)
GLUCOSE-CAPILLARY: 168 mg/dL — AB (ref 70–99)
GLUCOSE-CAPILLARY: 164 mg/dL — AB (ref 70–99)
Glucose-Capillary: 123 mg/dL — ABNORMAL HIGH (ref 70–99)

## 2018-01-11 LAB — CBC WITH DIFFERENTIAL/PLATELET
BASOS PCT: 1 %
Basophils Absolute: 0.1 10*3/uL (ref 0.0–0.1)
Eosinophils Absolute: 0.1 10*3/uL (ref 0.0–0.7)
Eosinophils Relative: 1 %
HEMATOCRIT: 23.6 % — AB (ref 39.0–52.0)
HEMOGLOBIN: 7.1 g/dL — AB (ref 13.0–17.0)
LYMPHS ABS: 0.6 10*3/uL — AB (ref 0.7–4.0)
Lymphocytes Relative: 11 %
MCH: 30.2 pg (ref 26.0–34.0)
MCHC: 30.1 g/dL (ref 30.0–36.0)
MCV: 100.4 fL — AB (ref 78.0–100.0)
MONOS PCT: 14 %
Monocytes Absolute: 0.8 10*3/uL (ref 0.1–1.0)
NEUTROS ABS: 3.9 10*3/uL (ref 1.7–7.7)
Neutrophils Relative %: 73 %
Platelets: 56 10*3/uL — ABNORMAL LOW (ref 150–400)
RBC: 2.35 MIL/uL — ABNORMAL LOW (ref 4.22–5.81)
RDW: 25 % — AB (ref 11.5–15.5)
WBC: 5.5 10*3/uL (ref 4.0–10.5)

## 2018-01-11 LAB — RENAL FUNCTION PANEL
ANION GAP: 14 (ref 5–15)
Albumin: 2.3 g/dL — ABNORMAL LOW (ref 3.5–5.0)
BUN: 42 mg/dL — ABNORMAL HIGH (ref 8–23)
CHLORIDE: 92 mmol/L — AB (ref 98–111)
CO2: 21 mmol/L — AB (ref 22–32)
Calcium: 7.8 mg/dL — ABNORMAL LOW (ref 8.9–10.3)
Creatinine, Ser: 4.91 mg/dL — ABNORMAL HIGH (ref 0.61–1.24)
GFR calc non Af Amer: 11 mL/min — ABNORMAL LOW (ref >60)
GFR, EST AFRICAN AMERICAN: 13 mL/min — AB (ref >60)
GLUCOSE: 120 mg/dL — AB (ref 70–99)
POTASSIUM: 4.5 mmol/L (ref 3.5–5.1)
Phosphorus: 6.6 mg/dL — ABNORMAL HIGH (ref 2.5–4.6)
Sodium: 127 mmol/L — ABNORMAL LOW (ref 135–145)

## 2018-01-11 LAB — MAGNESIUM: Magnesium: 2.4 mg/dL (ref 1.7–2.4)

## 2018-01-11 MED ORDER — PIPERACILLIN-TAZOBACTAM 3.375 G IVPB
3.3750 g | Freq: Two times a day (BID) | INTRAVENOUS | Status: AC
  Administered 2018-01-11: 3.375 g via INTRAVENOUS
  Filled 2018-01-11 (×2): qty 50

## 2018-01-11 NOTE — Progress Notes (Signed)
Pharmacy Antibiotic Note  Jose Heath is a 67 y.o. male admitted on 01/01/2018 with sepsis.  Pharmacy has been consulted for Zosyn dosing.  Today is day #10 of antibiotic therapy.  Pt has transferred out of the ICU, is off CRRT, and awaiting timing of next HD.  No reported UOP.  Will change drug dosing to ESRD dosing based on no improvement in SCr and no UOP.  Plan: Zosyn 3.375g IV q12h (4 hour infusion). Spoke with Dr. Alfredia Ferguson today to evaluate clinical course and abx LOT.  Height: 5\' 9"  (175.3 cm) Weight: 182 lb 12.2 oz (82.9 kg) IBW/kg (Calculated) : 70.7  Temp (24hrs), Avg:98.2 F (36.8 C), Min:97.7 F (36.5 C), Max:98.6 F (37 C)  Recent Labs  Lab 01/06/18 1328  01/07/18 0349 01/07/18 1630 01/08/18 0410 01/09/18 0408 01/10/18 0445 01/11/18 0426  WBC  --   --  5.2  --  4.6 5.6 5.2 5.5  CREATININE  --    < > 1.24 1.42* 1.37* 2.47* 3.72* 4.91*  VANCOTROUGH 21*  --   --   --   --   --   --   --    < > = values in this interval not displayed.    Estimated Creatinine Clearance: 14.8 mL/min (A) (by C-G formula based on SCr of 4.91 mg/dL (H)).    No Known Allergies  Antimicrobials this admission: Ceftriaxone 6/28 x 1 Vancomycin 6/28 >> 7/5 Zosyn 6/28 >>  Dose adjustments this admission: 7/3: VT (24hr) = 21: decrease to 750mg  IV q24h  Microbiology results: Blood x 2 [place] 6/28 > neg (called [name] to confirm) UCx (at [facility]) > candida albicans (called to confirm) Blood x 2 6/28: neg 6/28 MRSA PCR neg   Thank you for allowing pharmacy to be a part of this patient's care.  [employer], Pharm.D., BCPS Clinical Pharmacist Pager: [phone] Clinical phone for 01/11/2018 from 8:30-4:00 is [phone].  **Pharmacist phone directory can now be found on [email or web address] ([email or web address]).  Listed under [place].  01/11/2018 2:12 PM

## 2018-01-11 NOTE — Progress Notes (Signed)
HD catheter has a moderate amount of dried blood under dressing. This RN spoke with IV team about changing dressing. IV Nurse acknowledged that dressing needed to be changed.   Eleanora Neighbor, RN

## 2018-01-11 NOTE — Evaluation (Signed)
Physical Therapy Evaluation Patient Details Name: Jose Heath MRN: [id number] DOB: 1950-10-05 Today's Date: 01/11/2018   History of Present Illness  67 y.o. male admitted on 01/01/2018 from [facility] with complaints of fatigue and no urine output x1day. He has a past medical history significant for AICD, diabetes, A-fib (on eliquis), CKD stage 3, Osteomyelitis of right first toe s/p transmetatarsal amputation, CHF (EF 25-30%), and CAD s/p CABG. Since admission was found to be in septic shock due to UTI +/- Osteo, AKI-on-CKD, and Hyperkalemia.Initiated CVVHD in ICU via temporary L femoral access (as of 7/8, this line is still in), considering transitioning to IHD, but overall poor prognosis; Palliative Care Team involved and are working with family to discern Goals of Care  Clinical Impression   Pt admitted with above diagnosis. Pt currently with functional limitations due to the deficits listed below (see PT Problem List). Mr. Finger's prior level of function is unknown as he is not a reliable historian; At time of PT eval he was pleasantly confused, and willing to "take some exercise"; Mobility assessment limited by L temporary femoral HD access line, with flexion at L hip limited to 30 degrees, which precludes sitting up; AROM performed with bil UEs and RLE; Presents with generalized weakness in the setting of acute illness; Pt will benefit from skilled PT to increase their independence and safety with mobility, and decrease caregiver burden.  Will plan to follow at a distance, depending on L HD catheter removal (which will allow for more mobility); Will also of course take Palliative Care Medicine's lead as to whether PT has a role in Goals of Care.     Follow Up Recommendations Supervision/Assistance - 24 hour(Ultimately, venue will depend on Goals of Care, patient and family wishes, and available assist in the home; Pt is at SNF level mobility-wise; Can family give 24 hour mod to  max assist?)    Equipment Recommendations  Hospital bed  More equipment recs to be determined if/when L Temporary Femoral HD catheter is out   Recommendations for Other Services       Precautions / Restrictions Precautions Precautions: Other (comment) Precaution Comments: Temporary L Femoral HD Catheter -- no hip flexion LLE beyond 30 degrees Restrictions Weight Bearing Restrictions: No Other Position/Activity Restrictions: No L hip flexion beyond 30 degrees      Mobility  Bed Mobility Overal bed mobility: Needs Assistance Bed Mobility: Rolling Rolling: Mod assist         General bed mobility comments: light mod assist and use of rails to roll  Transfers                    Ambulation/Gait                Stairs            Wheelchair Mobility    Modified Rankin (Stroke Patients Only)       Balance                                             Pertinent Vitals/Pain Pain Assessment: Faces Faces Pain Scale: Hurts a little bit Pain Location: sacrum, "my butt" Pain Intervention(s): Monitored during session;Repositioned(Assisted sitter in repositioning)    Home Living Family/patient expects to be discharged to:: Unsure Living Arrangements: Other relatives  Additional Comments: Pt unable to give infromation re: home setup and available assist    Prior Function           Comments: Unknown; Pt unable to give information re: PLOF, and home setup     Hand Dominance        Extremity/Trunk Assessment   Upper Extremity Assessment Upper Extremity Assessment: Generalized weakness    Lower Extremity Assessment Lower Extremity Assessment: RLE deficits/detail;LLE deficits/detail RLE Deficits / Details: Noted Prevalon boots; Grossly WFL AROM in all planes of motion available while pt supine LLE Deficits / Details: L Temporary Femoral HD line in place, so no hip flexion beyond 30 deg; noted good quad  set       Communication   Communication: No difficulties  Cognition Arousal/Alertness: Awake/alert Behavior During Therapy: WFL for tasks assessed/performed;Impulsive Overall Cognitive Status: Impaired/Different from baseline Area of Impairment: Orientation;Attention;Awareness;Memory;Safety/judgement                 Orientation Level: Disoriented to;Situation;Time;Place Current Attention Level: Sustained Memory: Decreased recall of precautions;Decreased short-term memory   Safety/Judgement: Decreased awareness of safety;Decreased awareness of deficits     General Comments: Very pleasantly confused, drifting off to light sleep at times; able to answer questions related to his likes/dislikes (tells me "I'll Fly Away" is his favorite Immunologist)      General Comments      Exercises General Exercises - Upper Extremity Shoulder Flexion: AROM;Both;10 reps General Exercises - Lower Extremity Quad Sets: AROM;Both;10 reps Heel Slides: Right;AROM;10 reps   Assessment/Plan    PT Assessment Patient needs continued PT services  PT Problem List Decreased strength;Decreased range of motion;Decreased activity tolerance;Decreased skin integrity;Decreased knowledge of precautions;Decreased mobility       PT Treatment Interventions Functional mobility training;Therapeutic activities;Therapeutic exercise;Patient/family education    PT Goals (Current goals can be found in the Care Plan section)  Acute Rehab PT Goals Patient Stated Goal: Was agreeable to do a few exercises PT Goal Formulation: Patient unable to participate in goal setting Time For Goal Achievement: 01-28-18 Potential to Achieve Goals: Fair    Frequency Min 2X/week(Pending Goals of Care, and if PT has a role in [place])   Barriers to discharge Other (comment) At this point, discharge venue is unclear; Palliative care is involved, and family is making decisions re: goals of care; I anticipate the need for 24 hour  assist post acute hospital stay, and very likely [facility] "6 Clicks" Daily Activity  Outcome Measure Difficulty turning over in bed (including adjusting bedclothes, sheets and blankets)?: A Lot Difficulty moving from lying on back to sitting on the side of the bed? : Unable Difficulty sitting down on and standing up from a chair with arms (e.g., wheelchair, bedside commode, etc,.)?: Unable Help needed moving to and from a bed to chair (including a wheelchair)?: Total Help needed walking in hospital room?: Total Help needed climbing 3-5 steps with a railing? : Total 6 Click Score: 7    End of Session   Activity Tolerance: Patient tolerated treatment well Patient left: in bed;with call bell/phone within reach;with nursing/sitter in room;Other (comment)(Nicole, NT replacing Prevalon boots`) Nurse Communication: Other (comment)(Motion restrictions LLE) PT Visit Diagnosis: Muscle weakness (generalized) (M62.81)    Time: 1450-1505 PT Time Calculation (min) (ACUTE ONLY): 15 min   Charges:   PT Evaluation $PT Eval Moderate Complexity: 1 Mod  PT G Codes:        Roney Marion, PT  Acute Rehabilitation Services Pager [phone] Office [place] 01/11/2018, 4:13 PM

## 2018-01-11 NOTE — Progress Notes (Signed)
PROGRESS NOTE    Jose Heath  FBP:[id number] DOB: 09-28-1950 DOA: 01/01/2018 PCP: Patient, No Pcp Per   Brief Narrative:  The patient is a 31yoM with hx CAD s/p CABG, CHF (EF 25-30%), AICD, DM, Osteomyelitis of Right 1st toe s/p transmetatarsal amputation, Afib (on eliquis), and and CKD Stage 3, presented to [facility] c/o fatigue and no UOP x 1 day. He was found there to be in septic shock due to UTI +/- Osteo, AKI-on-CKD, and Hyperkalemia. A Central line was placed. Patient was given 2L IVF bolus and started on broad spectrum antibiotics (Vanc and Zosyn) and vasopressors (Levophed and Dobutamine). He was then transferred to [facility] for continued care and initiation of CRRT. Pt also with Biventricular Heart Failure requiring Dobutamine gtt. on 01/07/2018 became extremely agitated and had to be started on Precedex and Haldol.  His pressors were weaned and he was transferred on nasal cannula with intermittent high dialysis the stepdown unit to [facility] on 01/11/18.  Palliative care involved for goals of care and family discussion currently being had.  Assessment & Plan:   Active Problems:   Septic shock (HCC)   Pressure injury of skin   Malnutrition of moderate degree  Acute respiratory failure with hypoxia secondary to decompensated biventricular heart failure with pulmonary edema and a focal density in lower lungs seen on chest x-ray in 7/2 -Oxygen being weaned and currently on supplemental oxygen  and will continue -Repeat chest x-ray in a.m. -PCCM to transfer the patient is saturating well on oxygen -He was helmeted toilet -Continue with albuterol nebs every 2 hours as needed  Septic and cardiogenic shock secondary to UTI and decompensated biventricular heart failure Moderate to severe tricuspid regurg History of CAD, and A. fib -Patient was placed on dobutamine -He is off of dobutamine now currently in hemodynamic stable -Continue to monitor hemodynamics  status  AKI on CKD stage III requiring CVVHD and now intermittent dialysis -Nephrology states patient is not a long-term candidate for dialysis -If kidney function does not improve the next 24 hours patient will be dialyzed -Patient's BUN/creatinine worsened and went from 27/3.72 is now 42/4.91 -Appreciate nephrology following further recommendations and evaluation -Continue to follow nephrology recommendations  History of liver cirrhosis -Continue to monitor LFTs  Confusion and acute delirium with suspected sundowning -Patient was alert and oriented this morning and knew she was in the hospital and he came in because of kidney failure -Patient also knew that today was Monday and the president was Daisy Floro -Continue with delirium precautions and Haldol for agitation- continue with bedside sitter this time -PT OT -Palliative care following -We will continue with quetiapine 50 g p.o. nightly  Septic shock secondary to recent UTI and questionable osteomyelitis -Currently being treated with antibiotics and this is day 10 -We will stop antibiotics in a.m. -To monitor white count and fever curve  Macrocytic anemia Thrombocytopenia -Hemoglobin/endocrine is stable from yesterday 7.1/23.6 and platelet count is 56 -Continue monitor for signs and symptoms of bleeding -Repeat CBC in a.m. -Transfuse if necessary IMA globin drops below 7  Coccygeal pressure ulcer and right total great full-thickness ulcer with previous imitation -Unstageable -Wound care consult appreciated -Treated with Gerhard's butt cream cream  Hyponatremia -Stable at 127 -Repeat CBC in a.m.  Hyperglycemia -Continue sliding scale sensitive NovoLog AC and at bedtime -Monitor CBGs  Hypothyroidism -Continue with levothyroxine 100 mcg p.o. daily  DVT prophylaxis: SCDs and Prevnar boots Code Status: FULL CODE Family Communication: No family present at  bedside Disposition Plan: Remain Inpatient for continued  Treatment and possible intermittent HD  Consultants:   PCCM Transfer  Nephrology     Procedures:  Renal Ultrasound 6/28>>> 1. Poorly visualized kidneys secondary to body habitus. No definite hydronephrosis.  2. Findings of cirrhosis as well as a small ascites. 2D echo 6/29>>> EF 20-25%, diffuse hypokinesis, moderate TR, severely reduced systolic function   Antimicrobials:  Anti-infectives (From admission, onward)   Start     Dose/Rate Route Frequency Ordered Stop   01/11/18 2200  piperacillin-tazobactam (ZOSYN) IVPB 3.375 g     3.375 g 12.5 mL/hr over 240 Minutes Intravenous Every 12 hours 01/11/18 1414     01/08/18 1200  piperacillin-tazobactam (ZOSYN) IVPB 2.25 g  Status:  Discontinued     2.25 g 100 mL/hr over 30 Minutes Intravenous Every 6 hours 01/08/18 0856 01/11/18 1414   01/06/18 2000  vancomycin (VANCOCIN) IVPB 750 mg/150 ml premix  Status:  Discontinued     750 mg 150 mL/hr over 60 Minutes Intravenous Every 24 hours 01/06/18 1531 01/08/18 0856   01/02/18 1400  vancomycin (VANCOCIN) IVPB 1000 mg/200 mL premix  Status:  Discontinued     1,000 mg 200 mL/hr over 60 Minutes Intravenous Every 24 hours 01/01/18 2354 01/06/18 1531   01/02/18 0200  piperacillin-tazobactam (ZOSYN) IVPB 3.375 g  Status:  Discontinued     3.375 g 12.5 mL/hr over 240 Minutes Intravenous Every 12 hours 01/01/18 2049 01/01/18 2350   01/02/18 0200  piperacillin-tazobactam (ZOSYN) IVPB 3.375 g  Status:  Discontinued     3.375 g 100 mL/hr over 30 Minutes Intravenous Every 6 hours 01/01/18 2351 01/08/18 0856     Subjective: Seen and examined this morning as well as confused.  Sitter was at bedside.  Creatinine continues to climb up.  Palliative involved for goals of care and had ultrasound and decision for CODE STATUS to be done tomorrow.  Patient denied chest pain, shortness breath, nausea, vomiting.  Objective: Vitals:   01/11/18 0409 01/11/18 0513 01/11/18 1255 01/11/18 1257  BP:  95/66 (!)  89/41 (!) 93/57  Pulse:  69 70 68  Resp:  18 18 18   Temp:  98.2 F (36.8 C)    TempSrc:  Oral    SpO2:  100% 91% 94%  Weight: 82.9 kg (182 lb 12.2 oz)     Height:        Intake/Output Summary (Last 24 hours) at 01/11/2018 1638 Last data filed at 01/11/2018 1454 Gross per 24 hour  Intake 870 ml  Output 0 ml  Net 870 ml   Filed Weights   01/09/18 0406 01/10/18 0326 01/11/18 0409  Weight: 79.6 kg (175 lb 7.8 oz) 82.9 kg (182 lb 12.2 oz) 82.9 kg (182 lb 12.2 oz)   Examination: Physical Exam:  Constitutional: Chronically ill-appearing Caucasian male who is currently in NAD and appears calm and comfortable; is confused on my examination Eyes: Lids and conjunctivae normal, sclerae anicteric  ENMT: External Ears, Nose appear normal. Grossly normal hearing. Mucous membranes are moist.  Neck: Appears normal, supple, no cervical masses, normal ROM, no appreciable thyromegaly, no JVD Respiratory: Diminished to auscultation bilaterally at the bases, no wheezing, rales, rhonchi or crackles. Normal respiratory effort and patient is not tachypenic. No accessory muscle use.  Supplemental oxygen via nasal cannula Cardiovascular: RRR, 2/6 systolic murmur. S1 and S2 auscultated.  1-2+ lower extremity edema Abdomen: Soft, slightly tender, Distended body habitus.  Bowel sounds positive x4 GU: Deferred. Musculoskeletal: Right great toe amputation  no contractures noted Skin: No rashes, lesions, ulcers but has multiple areas of bruising and ecchymosis in extremities. No induration; Warm and dry.  Neurologic: CN 2-12 grossly intact with no focal deficits.  Psychiatric:.  Impaired judgment and insight. Alert and oriented x 2. Normal mood and appropriate affect.   Data Reviewed: I have personally reviewed following labs and imaging studies  CBC: Recent Labs  Lab 01/07/18 0349 01/08/18 0410 01/09/18 0408 01/10/18 0445 01/11/18 0426  WBC 5.2 4.6 5.6 5.2 5.5  NEUTROABS  --  3.6  --  3.9 3.9  HGB 7.2*  7.7* 7.6* 7.0* 7.1*  HCT 24.3* 25.7* 24.9* 23.0* 23.6*  MCV 100.4* 101.6* 100.0 99.1 100.4*  PLT 70* 70* 58* 51* 56*   Basic Metabolic Panel: Recent Labs  Lab 01/07/18 0349 01/07/18 1630 01/08/18 0410 01/09/18 0408 01/10/18 0445 01/11/18 0426  NA 133* 134* 134* 130* 128* 127*  K 3.2* 3.6 3.7 3.9 4.1 4.5  CL 99 98 98 96* 93* 92*  CO2 26 27 27 25 24  21*  GLUCOSE 69* 158* 87 100* 128* 120*  BUN 7* 7* 6* 15 27* 42*  CREATININE 1.24 1.42* 1.37* 2.47* 3.72* 4.91*  CALCIUM 7.5* 7.4* 7.4* 7.5* 7.8* 7.8*  MG 2.2  --  2.2 2.3 2.5* 2.4  PHOS 3.1 2.8 2.4* 3.5 5.0* 6.6*   GFR: Estimated Creatinine Clearance: 14.8 mL/min (A) (by C-G formula based on SCr of 4.91 mg/dL (H)). Liver Function Tests: Recent Labs  Lab 01/07/18 0349 01/07/18 1630 01/08/18 0410 01/09/18 0408 01/11/18 0426  ALBUMIN 2.6* 2.5* 2.5* 2.3* 2.3*   No results for input(s): LIPASE, AMYLASE in the last 168 hours. Recent Labs  Lab 01/09/18 1147  AMMONIA 43*   Coagulation Profile: No results for input(s): INR, PROTIME in the last 168 hours. Cardiac Enzymes: No results for input(s): CKTOTAL, CKMB, CKMBINDEX, TROPONINI in the last 168 hours. BNP (last 3 results) No results for input(s): PROBNP in the last 8760 hours. HbA1C: No results for input(s): HGBA1C in the last 72 hours. CBG: Recent Labs  Lab 01/10/18 1124 01/10/18 1640 01/10/18 2115 01/11/18 0810 01/11/18 1212  GLUCAP 217* 132* 79 123* 158*   Lipid Profile: No results for input(s): CHOL, HDL, LDLCALC, TRIG, CHOLHDL, LDLDIRECT in the last 72 hours. Thyroid Function Tests: No results for input(s): TSH, T4TOTAL, FREET4, T3FREE, THYROIDAB in the last 72 hours. Anemia Panel: No results for input(s): VITAMINB12, FOLATE, FERRITIN, TIBC, IRON, RETICCTPCT in the last 72 hours. Sepsis Labs: Recent Labs  Lab 01/05/18 0419 01/06/18 0323  PROCALCITON 0.52 0.58    Recent Results (from the past 240 hour(s))  MRSA PCR Screening     Status: None    Collection Time: 01/01/18  7:32 PM  Result Value Ref Range Status   MRSA by PCR NEGATIVE NEGATIVE Final    Comment:        The GeneXpert MRSA Assay (FDA approved for NASAL specimens only), is one component of a comprehensive MRSA colonization surveillance program. It is not intended to diagnose MRSA infection nor to guide or monitor treatment for MRSA infections. Performed at [facility], [place] [address], [place], [place] [address]   Culture, blood (routine x 2)     Status: None   Collection Time: 01/01/18  9:23 PM  Result Value Ref Range Status   Specimen Description BLOOD LEFT WRIST  Final   Special Requests   Final    BOTTLES DRAWN AEROBIC AND ANAEROBIC Blood Culture adequate volume   Culture  Final    NO GROWTH 5 DAYS Performed at [facility], [place] [address], [place], [place] [address]    Report Status 01/06/2018 FINAL  Final  Culture, blood (routine x 2)     Status: None   Collection Time: 01/01/18  9:33 PM  Result Value Ref Range Status   Specimen Description BLOOD RIGHT WRIST  Final   Special Requests   Final    BOTTLES DRAWN AEROBIC ONLY Blood Culture results may not be optimal due to an inadequate volume of blood received in culture bottles   Culture   Final    NO GROWTH 5 DAYS Performed at [facility], [place] [address], [place],  [address]    Report Status 01/06/2018 FINAL  Final    Radiology Studies: Dg Chest Port 1 View  Result Date: 01/11/2018 CLINICAL DATA:  Respiratory failure EXAM: PORTABLE CHEST 1 VIEW COMPARISON:  01/10/2018 FINDINGS: AICD unchanged. CABG. Central venous catheter tip in the SVC unchanged Bilateral airspace disease with mild interval improvement. Small bilateral effusions unchanged. IMPRESSION: Improvement in bilateral airspace disease most likely pulmonary edema. Small pleural effusions. Electronically Signed   By: Franchot Gallo M.D.   On: 01/11/2018 07:27   Dg Chest Port 1 View  Result Date: 01/10/2018 CLINICAL  DATA:  Respiratory failure. EXAM: PORTABLE CHEST 1 VIEW COMPARISON:  01/08/2018. FINDINGS: Unchanged cardiac silhouette, and dual lead pacer. Central venous catheter device from RIGHT IJ approach lies at the cavoatrial junction. BILATERAL pulmonary opacities, likely edema, stable. IMPRESSION: Stable chest. Electronically Signed   By: Staci Righter M.D.   On: 01/10/2018 07:20   Scheduled Meds: . Chlorhexidine Gluconate Cloth  6 each Topical Daily  . collagenase   Topical BID  . docusate sodium  100 mg Oral BID  . feeding supplement (ENSURE ENLIVE)  237 mL Oral BID BM  . insulin aspart  0-5 Units Subcutaneous QHS  . insulin aspart  0-9 Units Subcutaneous TID WC  . levothyroxine  100 mcg Oral QAC breakfast  . mouth rinse  15 mL Mouth Rinse BID  . QUEtiapine  50 mg Oral QHS  . sodium chloride flush  10-40 mL Intracatheter Q12H   Continuous Infusions: . sodium chloride 250 mL (01/11/18 0437)  . piperacillin-tazobactam (ZOSYN)  IV      LOS: 10 days   Kerney Elbe, DO [facility] Pager [phone]  If 7PM-7AM, please contact night-coverage [email or web address] Password [facility] 01/11/2018, 4:38 PM

## 2018-01-11 NOTE — Progress Notes (Signed)
Consulted for femoral HD drsg assessment. Upon assessment of drsg. Was  found that Biopatch was saturated with bloody drainage. After removing dressing, this nurse noted that the [place] had greenish/white drainage on it and some around the wound itself. With maceration noted around insertion of temporary HD catheter causing skin breakdown. Patient's nurse was called to assess site also. Site cleaned and sterile drsg. placed to site. Fran Lowes, RN VAST

## 2018-01-11 NOTE — Progress Notes (Signed)
Daily Progress Note   Patient Name: Jose Heath       Date: 01/11/2018 DOB: 08-30-1950  Age: 67 y.o. MRN#: [id number] Attending Physician: Kerney Elbe, DO Primary Care Physician: Patient, No Pcp Per Admit Date: 01/01/2018  Reason for Consultation/Follow-up: Establishing goals of care  Subjective: Patient is in bed asleep. Remains confused. Sitter is at the bedside. No family at bedside. Spoke with son who stated yesterday that he wanted to meet with the sisters and allow them to visit prior to making a decision on code status and potentially initiating hospice services at discharge. Son states he will need until tomorrow to make a decision. He did ask questions regarding residential hospice facility and if his father would meet criteria to be admitted versus coming home with hospice services. We discussed in detail and I explained that his father would be a candidate for [facility] if he chose to make him a DNR/DNI and he was not receiving further HD treatments. Son verbalized understanding and appreciation.   Chart Reviewed and report received from bedside RN.   Length of Stay: 10  Current Medications: Scheduled Meds:  . Chlorhexidine Gluconate Cloth  6 each Topical Daily  . collagenase   Topical BID  . docusate sodium  100 mg Oral BID  . feeding supplement (ENSURE ENLIVE)  237 mL Oral BID BM  . insulin aspart  0-5 Units Subcutaneous QHS  . insulin aspart  0-9 Units Subcutaneous TID WC  . levothyroxine  100 mcg Oral QAC breakfast  . mouth rinse  15 mL Mouth Rinse BID  . QUEtiapine  50 mg Oral QHS  . sodium chloride flush  10-40 mL Intracatheter Q12H    Continuous Infusions: . sodium chloride 250 mL (01/11/18 0437)  . piperacillin-tazobactam (ZOSYN)  IV      PRN  Meds: sodium chloride, acetaminophen, albuterol, Gerhardt's butt cream, haloperidol lactate, ondansetron (ZOFRAN) IV, sodium chloride flush  Physical Exam         Constitutional: He appears cachectic. He is uncooperative. He appears ill.  Aggressive verbiage   Cardiovascular: Regular rhythm and normal heart sounds. Exam reveals decreased pulses.  Pulmonary/Chest: Effort normal. He has decreased breath sounds.  Musculoskeletal:  Generalized weakness, great toe amputation   Neurological: He is alert. He is disoriented.  Psychiatric: His speech is normal.  He is aggressive. Cognition and memory are impaired. He expresses inappropriate judgment.  Nursing note and vitals reviewed.   Vital Signs: BP (!) 93/57 (BP Location: Left Arm)   Pulse 68   Temp 98.2 F (36.8 C) (Oral)   Resp 18   Ht 5\' 9"  (1.753 m)   Wt 82.9 kg (182 lb 12.2 oz)   SpO2 94%   BMI 26.99 kg/m  SpO2: SpO2: 94 % O2 Device: O2 Device: Nasal Cannula O2 Flow Rate: O2 Flow Rate (L/min): 2 L/min  Intake/output summary:   Intake/Output Summary (Last 24 hours) at 01/11/2018 1542 Last data filed at 01/11/2018 1454 Gross per 24 hour  Intake 870 ml  Output 0 ml  Net 870 ml   LBM: Last BM Date: 01/11/18 Baseline Weight: Weight: 98.9 kg (218 lb 0.6 oz) Most recent weight: Weight: 82.9 kg (182 lb 12.2 oz)       Palliative Assessment/Data: PPS 30%   Flowsheet Rows     Most Recent Value  Intake Tab  Referral Department  Hospitalist  Unit at Time of Referral  ICU  Palliative Care Primary Diagnosis  Nephrology  Date Notified  01/08/18  Palliative Care Type  New Palliative care  Reason for referral  Clarify Goals of Care  Date of Admission  01/01/18  Date first seen by Palliative Care  01/10/18  # of days Palliative referral response time  2 Day(s)  # of days IP prior to Palliative referral  7  Clinical Assessment  Psychosocial & Spiritual Assessment  Palliative Care Outcomes      Patient Active Problem List    Diagnosis Date Noted  . Malnutrition of moderate degree 01/07/2018  . Pressure injury of skin 01/02/2018  . Septic shock ([place] 01/01/2018  . Arthritis 09/17/2016  . Atrial flutter ([place]) 09/17/2016  . Coronary artery disease 09/17/2016  . Diabetes mellitus ([place]) 09/17/2016  . Disease of thyroid gland 09/17/2016  . HOH (hard of hearing) 09/17/2016  . Hyperlipidemia 09/17/2016  . ICD (implantable cardioverter-defibrillator) in place 09/17/2016  . Left foot pain 09/17/2016  . Skin cancer 09/17/2016  . TSH (thyroid-stimulating hormone deficiency) 08/13/2016  . Anasarca 07/30/2016  . Charcot foot due to diabetes mellitus ([place]) 02/13/2016  . Atypical atrial flutter ([place]) 10/04/2015  . Essential hypertension 10/04/2015  . Ischemic cardiomyopathy 10/04/2015  . Pure hypercholesterolemia 10/04/2015  . Amputated toe of left foot ([place]) 08/15/2015  . Diabetic peripheral neuropathy ([place]) 08/15/2015  . Type 2 diabetes mellitus with Charcot's joint of left foot ([place]) 08/15/2015  . Cardiac defibrillator in place 05/29/2015  . Cardiomyopathy ([place]) 05/29/2015  . Dual ICD (implantable cardioverter-defibrillator) in place 05/29/2015    Palliative Care Assessment & Plan   Patient Profile: 67 y.o. male admitted on 01/01/2018 from [facility] with complaints of fatigue and no urine output x1day. He has a past medical history significant for AICD, diabetes, A-fib (on eliquis), CKD stage 3, Osteomyelitis of right first toe s/p transmetatarsal amputation, CHF (EF 25-30%), and CAD s/p CABG. Since admission was found to be in septic shock due to UTI +/- Osteo, AKI-on-CKD, and Hyperkalemia. A Central line was placed. Patient was given 2L IVF bolus and started on broad spectrum antibiotics (Vanc and Zosyn) and vasopressors (Levophed and Dobutamine). He was transferred to [place] here at [facility] for continued care and consideration of dialysis. He is now being followed by Nephrology. He was started on CVVHD  on 6/28 and came off on 7/5. BUN/CR continues to increase despite discontinuation  of pressors. Palliative Medicine consulted for goals of care discussion.   Recommendations/Plan:  Full code-son is planning to discuss CODE STATUS with patient's sisters.  He states when they have made a decision he will call and let someone know.  He plans to discuss with them this afternoon or first thing tomorrow.  He is leaning towards a DNR/DNI, but continues to want  input from patient's sisters. States he should have a decision by tomorrow 7/9.   Continue to treat the treatable.  Son is hopeful patient will be able to receive an additional HD, with hopes of improving his mental status and allowing family members to have at least one more lucid conversation with him and allow them to tell him that they love them.  Son was advised this may not be an option and or patient's mental status may not improve.  He wants to also wait to hear from nephrology and their plan of care for him.  He is hopeful for the best but is prepared for the worst.  If son decides to proceed with DNR/DNI and comfort measures patient would be a candidate for residential hospice facility.  Palliative care team will continue to support patient, patient's family, and medical team during hospitalization.  Goals of Care and Additional Recommendations:  Limitations on Scope of Treatment: Full Scope Treatment  Code Status:    Code Status Orders  (From admission, onward)        Start     Ordered   01/01/18 1935  Full code  Continuous     01/01/18 1938    Code Status History    This patient has a current code status but no historical code status.     Prognosis:   Guarded to poor in the setting of AKI, chronic kidney disease, poor p.o. intake, immobility, acute on chronic biventricular CHF, EF 25 -30%, altered mental status, shock, anemia, osteomyelitis status post amputation with poor wound healing, decubitus ulcers, moderate protein  malnutrition, and poor functional status.  Discharge Planning:  To Be Determined  Care plan was discussed with patient's son and bedside RN.   Thank you for allowing the Palliative Medicine Team to assist in the care of this patient.   Total Time 25 min Prolonged Time Billed NO        Greater than 50%  of this time was spent counseling and coordinating care related to the above assessment and plan.  Alda Lea, NP-BC  Please contact Palliative Medicine Team phone at [phone] for questions and concerns.

## 2018-01-11 NOTE — Progress Notes (Signed)
Subjective: Interval History: has complaints wants to go home.  Objective: Vital signs in last 24 hours: Temp:  [97.7 F (36.5 C)-98.6 F (37 C)] 98.2 F (36.8 C) (07/08 0513) Pulse Rate:  [68-71] 68 (07/08 1257) Resp:  [18-19] 18 (07/08 1257) BP: (89-102)/(41-68) 93/57 (07/08 1257) SpO2:  [91 %-100 %] 94 % (07/08 1257) Weight:  [82.9 kg (182 lb 12.2 oz)] 82.9 kg (182 lb 12.2 oz) (07/08 0409) Weight change: 0 kg (0 lb)  Intake/Output from previous day: 07/07 0701 - 07/08 0700 In: 815.1 [P.O.:690; I.V.:75; IV Piggyback:50] Out: 0  Intake/Output this shift: Total I/O In: 510 [P.O.:480; I.V.:30] Out: -   General appearance: awake but severely confused. Resp: rales bibasilar Cardio: S1, S2 normal and systolic murmur: holosystolic 2/6, blowing at apex GI: liver down 5 cm, pos bs Extremities: 1-2+ edema, L groin HD cath  Lab Results: Recent Labs    01/10/18 0445 01/11/18 0426  WBC 5.2 5.5  HGB 7.0* 7.1*  HCT 23.0* 23.6*  PLT 51* 56*   BMET:  Recent Labs    01/10/18 0445 01/11/18 0426  NA 128* 127*  K 4.1 4.5  CL 93* 92*  CO2 24 21*  GLUCOSE 128* 120*  BUN 27* 42*  CREATININE 3.72* 4.91*  CALCIUM 7.8* 7.8*   No results for input(s): PTH in the last 72 hours. Iron Studies: No results for input(s): IRON, TIBC, TRANSFERRIN, FERRITIN in the last 72 hours.  Studies/Results: Dg Chest Port 1 View  Result Date: 01/11/2018 CLINICAL DATA:  Respiratory failure EXAM: PORTABLE CHEST 1 VIEW COMPARISON:  01/10/2018 FINDINGS: AICD unchanged. CABG. Central venous catheter tip in the SVC unchanged Bilateral airspace disease with mild interval improvement. Small bilateral effusions unchanged. IMPRESSION: Improvement in bilateral airspace disease most likely pulmonary edema. Small pleural effusions. Electronically Signed   By: Franchot Gallo M.D.   On: 01/11/2018 07:27   Dg Chest Port 1 View  Result Date: 01/10/2018 CLINICAL DATA:  Respiratory failure. EXAM: PORTABLE CHEST 1 VIEW  COMPARISON:  01/08/2018. FINDINGS: Unchanged cardiac silhouette, and dual lead pacer. Central venous catheter device from RIGHT IJ approach lies at the cavoatrial junction. BILATERAL pulmonary opacities, likely edema, stable. IMPRESSION: Stable chest. Electronically Signed   By: Staci Righter M.D.   On: 01/10/2018 07:20    I have reviewed the patient's current medications.  Assessment/Plan: 1 AKI no recovery of function. Mild acidemia, low SNa with vol xs. Will hold off HD, as he is aggitated and opposed, will nee within 24 h if not better.  Not long term candidate 2 Confusion 3 DM 4 CM with ICD 5 Decubitus 6 Schock 7 CAD 8 [place] foot P follow chem will need HD if not better, needs family to coerce or to sedate    LOS: 10 days   Jeneen Rinks Latravion Graves 01/11/2018,1:59 PM

## 2018-01-12 ENCOUNTER — Inpatient Hospital Stay (HOSPITAL_COMMUNITY): Payer: Medicare HMO

## 2018-01-12 DIAGNOSIS — R0602 Shortness of breath: Secondary | ICD-10-CM

## 2018-01-12 DIAGNOSIS — Z66 Do not resuscitate: Secondary | ICD-10-CM

## 2018-01-12 LAB — COMPREHENSIVE METABOLIC PANEL
ALBUMIN: 2.2 g/dL — AB (ref 3.5–5.0)
ALT: 21 U/L (ref 0–44)
AST: 17 U/L (ref 15–41)
Alkaline Phosphatase: 62 U/L (ref 38–126)
Anion gap: 18 — ABNORMAL HIGH (ref 5–15)
BILIRUBIN TOTAL: 1.6 mg/dL — AB (ref 0.3–1.2)
BUN: 51 mg/dL — AB (ref 8–23)
CHLORIDE: 91 mmol/L — AB (ref 98–111)
CO2: 19 mmol/L — ABNORMAL LOW (ref 22–32)
CREATININE: 5.99 mg/dL — AB (ref 0.61–1.24)
Calcium: 7.9 mg/dL — ABNORMAL LOW (ref 8.9–10.3)
GFR calc Af Amer: 10 mL/min — ABNORMAL LOW (ref >60)
GFR, EST NON AFRICAN AMERICAN: 9 mL/min — AB (ref >60)
GLUCOSE: 148 mg/dL — AB (ref 70–99)
POTASSIUM: 4.9 mmol/L (ref 3.5–5.1)
Sodium: 128 mmol/L — ABNORMAL LOW (ref 135–145)
TOTAL PROTEIN: 6.3 g/dL — AB (ref 6.5–8.1)

## 2018-01-12 LAB — CBC WITH DIFFERENTIAL/PLATELET
BASOS ABS: 0.1 10*3/uL (ref 0.0–0.1)
Basophils Relative: 1 %
EOS PCT: 1 %
Eosinophils Absolute: 0.1 10*3/uL (ref 0.0–0.7)
HEMATOCRIT: 24.5 % — AB (ref 39.0–52.0)
Hemoglobin: 7.4 g/dL — ABNORMAL LOW (ref 13.0–17.0)
LYMPHS PCT: 9 %
Lymphs Abs: 0.5 10*3/uL — ABNORMAL LOW (ref 0.7–4.0)
MCH: 30.7 pg (ref 26.0–34.0)
MCHC: 30.2 g/dL (ref 30.0–36.0)
MCV: 101.7 fL — AB (ref 78.0–100.0)
MONOS PCT: 13 %
Monocytes Absolute: 0.7 10*3/uL (ref 0.1–1.0)
NEUTROS PCT: 76 %
Neutro Abs: 4.3 10*3/uL (ref 1.7–7.7)
Platelets: 66 10*3/uL — ABNORMAL LOW (ref 150–400)
RBC: 2.41 MIL/uL — AB (ref 4.22–5.81)
RDW: 25 % — AB (ref 11.5–15.5)
WBC: 5.7 10*3/uL (ref 4.0–10.5)

## 2018-01-12 LAB — MAGNESIUM: MAGNESIUM: 2.8 mg/dL — AB (ref 1.7–2.4)

## 2018-01-12 LAB — GLUCOSE, CAPILLARY
GLUCOSE-CAPILLARY: 164 mg/dL — AB (ref 70–99)
GLUCOSE-CAPILLARY: 149 mg/dL — AB (ref 70–99)
Glucose-Capillary: 151 mg/dL — ABNORMAL HIGH (ref 70–99)
Glucose-Capillary: 144 mg/dL — ABNORMAL HIGH (ref 70–99)

## 2018-01-12 LAB — PHOSPHORUS: Phosphorus: 7.9 mg/dL — ABNORMAL HIGH (ref 2.5–4.6)

## 2018-01-12 MED ORDER — NEPRO/CARBSTEADY PO LIQD
237.0000 mL | Freq: Two times a day (BID) | ORAL | Status: DC
  Administered 2018-01-12 – 2018-01-21 (×11): 237 mL via ORAL
  Filled 2018-01-12 (×19): qty 237

## 2018-01-12 MED ORDER — TRAMADOL HCL 50 MG PO TABS
50.0000 mg | ORAL_TABLET | Freq: Four times a day (QID) | ORAL | Status: DC | PRN
  Administered 2018-01-12: 50 mg via ORAL
  Filled 2018-01-12 (×2): qty 1

## 2018-01-12 NOTE — Progress Notes (Signed)
Patient state that pain was a 10/10 tramadol was ordered. Tramadol was not given  because patient was falling asleep mid conversation. Tramadol was wasted with the witness of charged nurse Angela Nevin.

## 2018-01-12 NOTE — Progress Notes (Signed)
PROGRESS NOTE    Jose Heath  WUG:[id number] DOB: [birth year] DOA: 01/01/2018 PCP: Patient, No Pcp Per   Brief Narrative:  The patient is a 67yoM with hx CAD s/p CABG, CHF (EF 25-30%), AICD, DM, Osteomyelitis of Right 1st toe s/p transmetatarsal amputation, Afib (on eliquis), and and CKD Stage 3, presented to [place] [facility] c/o fatigue and no UOP x 1 day. He was found there to be in septic shock due to UTI +/- Osteo, AKI-on-CKD, and Hyperkalemia. A Central line was placed. Patient was given 2L IVF bolus and started on broad spectrum antibiotics (Vanc and Zosyn) and vasopressors (Levophed and Dobutamine). He was then transferred to [facility] for continued care and initiation of CRRT. Pt also with Biventricular Heart Failure requiring Dobutamine gtt. on 01/07/2018 became extremely agitated and had to be started on Precedex and Haldol.  His pressors were weaned and he was transferred on nasal cannula with intermittent high dialysis the stepdown unit to [facility] on 01/11/18.  Palliative care involved for goals of care and family discussion currently being had.  Assessment & Plan:   Active Problems:   Septic shock (HCC)   Pressure injury of skin   Malnutrition of moderate degree  Acute respiratory failure with hypoxia secondary to decompensated biventricular heart failure with pulmonary edema and a focal density in lower lungs seen on chest x-ray in 7/2 -Oxygen being weaned and currently on supplemental oxygen  and will continue -Repeat chest x-ray this a.m. Showed Similar appearance of diffuse slightly asymmetric airspace disease suggestive of pulmonary edema. Cardiomegaly with pacer in place.  Post CABG. -PCCM to transfer the patient is saturating well on oxygen; C/w Supplemental O2 via N -Contniue with Pulmonary toilet -Continue with albuterol nebs every 2 hours as needed  Septic and cardiogenic shock secondary to UTI and decompensated biventricular heart failure Moderate to severe  tricuspid regurg History of CAD, and A. fib -Patient was placed on dobutamine -He is off of dobutamine now currently in hemodynamic stable -Continue to monitor hemodynamics status  AKI on CKD stage III requiring CVVHD and now intermittent dialysis -Nephrology states patient is not a long-term candidate for dialysis -If kidney function does not improve the next 24 hours patient will be dialyzed -Patient's BUN/creatinine worsened and went from 27/3.72 is now 51/5.99 -Appreciate nephrology following further recommendations and evaluation -Continue to follow nephrology recommendations -Nephrology hemodialysis tomorrow if not better.  Dr. Gilman Buttner discussed with the patient's son about a limited trial of hemodialysis for 1 week and if not better than recommending hospice  History of liver cirrhosis -Continue to monitor LFTs -They have been normal and AST is 17 and ALT 21  Confusion and acute delirium with suspected sundowning, improving -Patient was alert and oriented this morning and knew she was in the hospital and he came in because of kidney failure -Patient also knew that today was Monday and the president was Daisy Floro -Continue with delirium precautions and Haldol for agitation- continue with bedside sitter this time -PT OT -Palliative care following -We will continue with Quetiapine 50 g p.o. nightly  Septic shock secondary to recent UTI and questionable osteomyelitis -Was being treated with antibiotics and now Stopped  -To monitor white count and fever curve  MacrocyticAnemia Thrombocytopenia -Hemoglobin/endocrine is stable from yesterday 7.4/24.5 and platelet count is 66 -Continue monitor for signs and symptoms of bleeding -Repeat CBC in a.m. -Transfuse if necessary if Hemoglobin drops below 7  Coccygeal pressure ulcer and right total great full-thickness ulcer with previous  imitation -Unstageable -Wound care consult appreciated -Treated with Gerhard's butt cream  cream -Continue to follow Wound Care Reccs  Hyponatremia -Stable at 128 -Repeat CBC in a.m.  Hyperglycemia -Continue sliding scale sensitive NovoLog AC and at bedtime -Monitor CBGs  Hypothyroidism -Continue with Levothyroxine 100 mcg p.o. daily  DVT prophylaxis: SCDs and Prevnar boots Code Status: FULL CODE Family Communication: No family present at bedside Disposition Plan: Remain Inpatient for continued Treatment and possible intermittent HD in AM   Consultants:   PCCM Transfer  Nephrology     Procedures:  Renal Ultrasound 6/28>>> 1. Poorly visualized kidneys secondary to body habitus. No definite hydronephrosis.  2. Findings of cirrhosis as well as a small ascites. 2D echo 6/29>>> EF 20-25%, diffuse hypokinesis, moderate TR, severely reduced systolic function   Antimicrobials:  Anti-infectives (From admission, onward)   Start     Dose/Rate Route Frequency Ordered Stop   01/11/18 2200  piperacillin-tazobactam (ZOSYN) IVPB 3.375 g     3.375 g 12.5 mL/hr over 240 Minutes Intravenous Every 12 hours 01/11/18 1414 01/12/18 1200   01/08/18 1200  piperacillin-tazobactam (ZOSYN) IVPB 2.25 g  Status:  Discontinued     2.25 g 100 mL/hr over 30 Minutes Intravenous Every 6 hours 01/08/18 0856 01/11/18 1414   01/06/18 2000  vancomycin (VANCOCIN) IVPB 750 mg/150 ml premix  Status:  Discontinued     750 mg 150 mL/hr over 60 Minutes Intravenous Every 24 hours 01/06/18 1531 01/08/18 0856   01/02/18 1400  vancomycin (VANCOCIN) IVPB 1000 mg/200 mL premix  Status:  Discontinued     1,000 mg 200 mL/hr over 60 Minutes Intravenous Every 24 hours 01/01/18 2354 01/06/18 1531   01/02/18 0200  piperacillin-tazobactam (ZOSYN) IVPB 3.375 g  Status:  Discontinued     3.375 g 12.5 mL/hr over 240 Minutes Intravenous Every 12 hours 01/01/18 2049 01/01/18 2350   01/02/18 0200  piperacillin-tazobactam (ZOSYN) IVPB 3.375 g  Status:  Discontinued     3.375 g 100 mL/hr over 30 Minutes Intravenous  Every 6 hours 01/01/18 2351 01/08/18 0856     Subjective: Seen and examined this morning and was alert and oriented but still intermittently remained confused as he thought Daisy Floro was giving him money.  He also complained of some buttock pain.  Denies any chest pain, shortness breath, nausea, vomiting.  States he is doing pretty good.  No other concerns or complaints at this time.  Sitter is currently still at bedside.  Objective: Vitals:   01/11/18 2031 01/12/18 0500 01/12/18 0555 01/12/18 1015  BP: (!) 96/58  116/90 (!) 95/56  Pulse: 70  70 64  Resp: 18  18 18   Temp: 97.8 F (36.6 C)  97.8 F (36.6 C) 97.6 F (36.4 C)  TempSrc: Oral  Oral Oral  SpO2: 100%  97% 99%  Weight:  87.1 kg (192 lb)    Height:        Intake/Output Summary (Last 24 hours) at 01/12/2018 1443 Last data filed at 01/12/2018 0846 Gross per 24 hour  Intake 360 ml  Output 0 ml  Net 360 ml   Filed Weights   01/10/18 0326 01/11/18 0409 01/12/18 0500  Weight: 82.9 kg (182 lb 12.2 oz) 82.9 kg (182 lb 12.2 oz) 87.1 kg (192 lb)   Examination: Physical Exam:  Constitutional: Chronically ill-appearing Caucasian male is currently in no acute distress appears calm but is intermittently confused. Eyes: Clear anicteric.  Lids and conjunctive normal  ENMT: External ears and nose appear normal.  Mucous members are moist Neck: Supple with no JVD Respiratory: Diminished to auscultation bilaterally with no appreciable wheezing, rales, rhonchi.  Unlabored breathing and is wearing supplemental oxygen via nasal cannula Cardiovascular: Regular rate and rhythm.  Has a 2 out of 6 soft murmur.  S1-S2 auscultated has 1-2+ lower extremity edema. Abdomen: Slightly tender, distended secondary body habitus.  Bowel sounds present all 4 quadrants GU: Deferred Musculoskeletal: The right great toe amputation.  No contractures noted Skin: Skin is warm and dry no appreciable rashes or lesions but does have multiple areas of bruising  and ecchymosis in the extremities.  Right foot is covered; has a temporary dialysis catheter no longer going to a significant sacral/blood ulcer Neurologic: Cranial nerves II through XII grossly intact no appreciable focal deficits Psychiatric:.  Impaired judgment and insight.  Patient is awake and oriented to place, person, year and day of the week.  Also knows the POTUS but is still slightly confused  Data Reviewed: I have personally reviewed following labs and imaging studies  CBC: Recent Labs  Lab 01/08/18 0410 01/09/18 0408 01/10/18 0445 01/11/18 0426 01/12/18 0429  WBC 4.6 5.6 5.2 5.5 5.7  NEUTROABS 3.6  --  3.9 3.9 4.3  HGB 7.7* 7.6* 7.0* 7.1* 7.4*  HCT 25.7* 24.9* 23.0* 23.6* 24.5*  MCV 101.6* 100.0 99.1 100.4* 101.7*  PLT 70* 58* 51* 56* 66*   Basic Metabolic Panel: Recent Labs  Lab 01/08/18 0410 01/09/18 0408 01/10/18 0445 01/11/18 0426 01/12/18 0429  NA 134* 130* 128* 127* 128*  K 3.7 3.9 4.1 4.5 4.9  CL 98 96* 93* 92* 91*  CO2 27 25 24  21* 19*  GLUCOSE 87 100* 128* 120* 148*  BUN 6* 15 27* 42* 51*  CREATININE 1.37* 2.47* 3.72* 4.91* 5.99*  CALCIUM 7.4* 7.5* 7.8* 7.8* 7.9*  MG 2.2 2.3 2.5* 2.4 2.8*  PHOS 2.4* 3.5 5.0* 6.6* 7.9*   GFR: Estimated Creatinine Clearance: 13.3 mL/min (A) (by C-G formula based on SCr of 5.99 mg/dL (H)). Liver Function Tests: Recent Labs  Lab 01/07/18 1630 01/08/18 0410 01/09/18 0408 01/11/18 0426 01/12/18 0429  AST  --   --   --   --  17  ALT  --   --   --   --  21  ALKPHOS  --   --   --   --  62  BILITOT  --   --   --   --  1.6*  PROT  --   --   --   --  6.3*  ALBUMIN 2.5* 2.5* 2.3* 2.3* 2.2*   No results for input(s): LIPASE, AMYLASE in the last 168 hours. Recent Labs  Lab 01/09/18 1147  AMMONIA 43*   Coagulation Profile: No results for input(s): INR, PROTIME in the last 168 hours. Cardiac Enzymes: No results for input(s): CKTOTAL, CKMB, CKMBINDEX, TROPONINI in the last 168 hours. BNP (last 3 results) No  results for input(s): PROBNP in the last 8760 hours. HbA1C: No results for input(s): HGBA1C in the last 72 hours. CBG: Recent Labs  Lab 01/11/18 1212 01/11/18 1648 01/11/18 2155 01/12/18 0744 01/12/18 1124  GLUCAP 158* 168* 164* 151* 164*   Lipid Profile: No results for input(s): CHOL, HDL, LDLCALC, TRIG, CHOLHDL, LDLDIRECT in the last 72 hours. Thyroid Function Tests: No results for input(s): TSH, T4TOTAL, FREET4, T3FREE, THYROIDAB in the last 72 hours. Anemia Panel: No results for input(s): VITAMINB12, FOLATE, FERRITIN, TIBC, IRON, RETICCTPCT in the last 72 hours. Sepsis Labs: Recent Labs  Lab 01/06/18 0323  PROCALCITON 0.58    No results found for this or any previous visit (from the past 240 hour(s)).  Radiology Studies: Dg Chest Port 1 View  Result Date: 01/12/2018 CLINICAL DATA:  67 year old male with shortness breath. Subsequent encounter. EXAM: PORTABLE CHEST 1 VIEW COMPARISON:  01/11/2018 chest x-ray. FINDINGS: Right costophrenic angle not included on present exam. AICD in place with leads unchanged in position. Post CABG. Cardiomegaly. Right central line tip mid to distal superior vena cava. Slightly asymmetric diffuse airspace disease suggestive of pulmonary edema. No gross pneumothorax. No acute osseous abnormality. IMPRESSION: Similar appearance of diffuse slightly asymmetric airspace disease suggestive of pulmonary edema. Cardiomegaly with pacer in place.  Post CABG. Electronically Signed   By: Genia Del M.D.   On: 01/12/2018 08:40   Dg Chest Port 1 View  Result Date: 01/11/2018 CLINICAL DATA:  Respiratory failure EXAM: PORTABLE CHEST 1 VIEW COMPARISON:  01/10/2018 FINDINGS: AICD unchanged. CABG. Central venous catheter tip in the SVC unchanged Bilateral airspace disease with mild interval improvement. Small bilateral effusions unchanged. IMPRESSION: Improvement in bilateral airspace disease most likely pulmonary edema. Small pleural effusions. Electronically Signed    By: Franchot Gallo M.D.   On: 01/11/2018 07:27   Scheduled Meds: . Chlorhexidine Gluconate Cloth  6 each Topical Daily  . collagenase   Topical BID  . docusate sodium  100 mg Oral BID  . feeding supplement (ENSURE ENLIVE)  237 mL Oral BID BM  . insulin aspart  0-5 Units Subcutaneous QHS  . insulin aspart  0-9 Units Subcutaneous TID WC  . levothyroxine  100 mcg Oral QAC breakfast  . mouth rinse  15 mL Mouth Rinse BID  . QUEtiapine  50 mg Oral QHS  . sodium chloride flush  10-40 mL Intracatheter Q12H   Continuous Infusions: . sodium chloride 250 mL (01/11/18 2212)    LOS: 11 days   Kerney Elbe, DO [facility] Pager [phone]  If 7PM-7AM, please contact night-coverage [email or web address] Password [email or web address] 01/12/2018, 2:43 PM

## 2018-01-12 NOTE — Progress Notes (Signed)
Nutrition Follow-up  DOCUMENTATION CODES:   Non-severe (moderate) malnutrition in context of chronic illness  INTERVENTION:   Magic cup TID with meals, each supplement provides 290 kcal and 9 grams of protein  Changed to Nepro Shake po BID, each supplement provides 425 kcal and 19 grams protein  Recommend liberalizing diet to promote po intake   NUTRITION DIAGNOSIS:   Moderate Malnutrition related to chronic illness(CKD, CHF, CAD) as evidenced by mild fat depletion, mild muscle depletion, moderate muscle depletion.  Being addressed via supplements  GOAL:   Patient will meet greater than or equal to 90% of their needs  Not Met  MONITOR:   PO intake, Supplement acceptance  REASON FOR ASSESSMENT:   Low Braden    ASSESSMENT:   67 yo male with PMH of AICD, CHF, DM, CKD, A fib, osteomyelitis of foot (s/p transmetatarsal amputation), CAD, cardiomyopathy, anemia who was admitted on 6/28 with septic shock.  Off CVVHD Noted pt remains oliguric, plan for HD tomorrow if no renal recovery. HD trial limited to 1 week then hospice if not improvement Palliative care following Pt alert, confused, 1:1 sitter at bedside. Family at bedside  Recorded po intake 30% of breakfast. Recorded po intake 66% on average based on last 8 meals recorded. Per CNA, pt requesting items that are not allowed on current diet like BLT sandwich, container of milk. Pt with Ensure Enlive ordered; unsure if pt has consumed any of these  Labs: phosphorus 7.9 (H), potassium wdl, sodium 128 (L), BUN 51, Creatinine 5.99 Meds: colace, ss novolog with meals and bedtime   Diet Order:   Diet Order           Diet renal with fluid restriction Fluid restriction: 1200 mL Fluid; Room service appropriate? Yes; Fluid consistency: Thin  Diet effective now          EDUCATION NEEDS:   No education needs have been identified at this time  Skin:  Skin Assessment: Skin Integrity Issues: Skin Integrity Issues::  Unstageable, Other (Comment) Unstageable: sacrum Other: MASD; R foot large non-healing wound  Last BM:  7/9  Height:   Ht Readings from Last 1 Encounters:  01/02/18 _0  (1.753 m)    Weight:   Wt Readings from Last 1 Encounters:  01/12/18 192 lb (87.1 kg)    Ideal Body Weight:  72.7 kg  BMI:  Body mass index is 28.35 kg/m.  Estimated Nutritional Needs:   Kcal:  2100-2300  Protein:  120-145 gm  Fluid:  1000 mL plus UOP   [employer] MS, RD, LDN, CNSC [phone] Pager  [phone] Weekend/On-Call Pager

## 2018-01-12 NOTE — Progress Notes (Signed)
Daily Progress Note   Patient Name: Jose Heath       Date: 01/12/2018 DOB: 1950/10/19  Age: 67 y.o. MRN#: [id number] Attending Physician: Kerney Elbe, DO Primary Care Physician: Patient, No Pcp Per Admit Date: 01/01/2018  Reason for Consultation/Follow-up: Establishing goals of care  Subjective: Patient is in bed asleep. Awakens when name called and falls right back to sleep. Son and sisters are at the bedside. Patient also continues to have a sitter at bedside for safety. Son reports he continues to be confused when awake.   Chart Reviewed and report received from bedside RN.   Length of Stay: 11  Current Medications: Scheduled Meds:  . Chlorhexidine Gluconate Cloth  6 each Topical Daily  . collagenase   Topical BID  . docusate sodium  100 mg Oral BID  . feeding supplement (ENSURE ENLIVE)  237 mL Oral BID BM  . insulin aspart  0-5 Units Subcutaneous QHS  . insulin aspart  0-9 Units Subcutaneous TID WC  . levothyroxine  100 mcg Oral QAC breakfast  . mouth rinse  15 mL Mouth Rinse BID  . QUEtiapine  50 mg Oral QHS  . sodium chloride flush  10-40 mL Intracatheter Q12H    Continuous Infusions: . sodium chloride 250 mL (01/11/18 2212)    PRN Meds: sodium chloride, acetaminophen, albuterol, Gerhardt's butt cream, haloperidol lactate, ondansetron (ZOFRAN) IV, sodium chloride flush, traMADol  Physical Exam         Constitutional: He appears cachectic. He is uncooperative. He appears fragile and chronically ill. Somewhat more lethargic today.  Cardiovascular: Regular rhythm and normal heart sounds. Exam reveals decreased pulses. Systolic murmur.  Pulmonary/Chest: Effort normal. He has decreased breath sounds. Rhonchi+.  Musculoskeletal:  Generalized weakness, great toe  amputation. BLE edema. Neurological: He is somewhat more lethargic today. Remains confused.  Psychiatric: His speech is normal. He is aggressive. Cognition and memory are impaired. He expresses inappropriate judgment.  Nursing note and vitals reviewed.  Vital Signs: BP (!) 95/56 (BP Location: Left Arm) Comment: Nichola, RN notified  Pulse 64   Temp 97.6 F (36.4 C) (Oral)   Resp 18   Ht 5\' 9"  (1.753 m)   Wt 87.1 kg (192 lb)   SpO2 99%   BMI 28.35 kg/m  SpO2: SpO2: 99 % O2 Device: O2 Device: Nasal  Cannula O2 Flow Rate: O2 Flow Rate (L/min): 2 L/min  Intake/output summary:   Intake/Output Summary (Last 24 hours) at 01/12/2018 1340 Last data filed at 01/12/2018 0846 Gross per 24 hour  Intake 360 ml  Output 0 ml  Net 360 ml   LBM: Last BM Date: 01/12/18 Baseline Weight: Weight: 98.9 kg (218 lb 0.6 oz) Most recent weight: Weight: 87.1 kg (192 lb)   Palliative Assessment/Data: PPS 20%   Flowsheet Rows     Most Recent Value  Intake Tab  Referral Department  Hospitalist  Unit at Time of Referral  ICU  Palliative Care Primary Diagnosis  Nephrology  Date Notified  01/08/18  Palliative Care Type  New Palliative care  Reason for referral  Clarify Goals of Care  Date of Admission  01/01/18  Date first seen by Palliative Care  01/10/18  # of days Palliative referral response time  2 Day(s)  # of days IP prior to Palliative referral  7  Clinical Assessment  Psychosocial & Spiritual Assessment  Palliative Care Outcomes     Patient Active Problem List   Diagnosis Date Noted  . Malnutrition of moderate degree 01/07/2018  . Pressure injury of skin 01/02/2018  . Septic shock ([place]) 01/01/2018  . Arthritis 09/17/2016  . Atrial flutter ([place]) 09/17/2016  . Coronary artery disease 09/17/2016  . Diabetes mellitus ([place]) 09/17/2016  . Disease of thyroid gland 09/17/2016  . HOH (hard of hearing) 09/17/2016  . Hyperlipidemia 09/17/2016  . ICD (implantable cardioverter-defibrillator)  in place 09/17/2016  . Left foot pain 09/17/2016  . Skin cancer 09/17/2016  . TSH (thyroid-stimulating hormone deficiency) 08/13/2016  . Anasarca 07/30/2016  . Charcot foot due to diabetes mellitus ([place]) 02/13/2016  . Atypical atrial flutter ([place]) 10/04/2015  . Essential hypertension 10/04/2015  . Ischemic cardiomyopathy 10/04/2015  . Pure hypercholesterolemia 10/04/2015  . Amputated toe of left foot ([place]) 08/15/2015  . Diabetic peripheral neuropathy ([place]) 08/15/2015  . Type 2 diabetes mellitus with Charcot's joint of left foot ([place]) 08/15/2015  . Cardiac defibrillator in place 05/29/2015  . Cardiomyopathy ([place]) 05/29/2015  . Dual ICD (implantable cardioverter-defibrillator) in place 05/29/2015    Palliative Care Assessment & Plan   Patient Profile: 67 y.o. male admitted on 01/01/2018 from [facility] with complaints of fatigue and no urine output x1day. He has a past medical history significant for AICD, diabetes, A-fib (on eliquis), CKD stage 3, Osteomyelitis of right first toe s/p transmetatarsal amputation, CHF (EF 25-30%), and CAD s/p CABG. Since admission was found to be in septic shock due to UTI +/- Osteo, AKI-on-CKD, and Hyperkalemia. A Central line was placed. Patient was given 2L IVF bolus and started on broad spectrum antibiotics (Vanc and Zosyn) and vasopressors (Levophed and Dobutamine). He was transferred to [place] here at [facility] for continued care and consideration of dialysis. He is now being followed by Nephrology. He was started on CVVHD on 6/28 and came off on 7/5. BUN/CR continues to increase despite discontinuation of pressors. Palliative Medicine consulted for goals of care discussion.   Recommendations/Plan:  DNR/DNI-at son's request. Son states he has discussed with sisters and they are all in agreement.    Continue to treat the treatable.  Son is hopeful patient will be able to receive additional HD, with hopes of improving his mental status and  allowing family members to have at least one more lucid conversation with him and allow them to tell him that they love them. He is aware that per Nephrology  patient will receive at least another week of HD to see if he shows signs of improvement and tolerance. If there is not any further changes or he continues to not be able to tolerate treatment, hospice should be considered. Son verbalized understanding and states he would like to at least attempt to continue with dialysis for another week and if Nephrology feels it is not appropriate at that time he would like to have him placed at residential hospice facility. Both son and sisters state they can not care for him in the home and emotionally/physically provide end of life care.   Palliative will continue to support patient, family, and medical team during hospitalization.   Goals of Care and Additional Recommendations:  Limitations on Scope of Treatment: Full Scope Treatment- continue to treat the treatable without escalation of care.   Code Status:    Code Status Orders  (From admission, onward)        Start     Ordered   01/01/18 1935  Full code  Continuous     01/01/18 1938    Code Status History    This patient has a current code status but no historical code status.     Prognosis:   6 months or less in the setting of continuation of HD. If discontinuation of dialysis 2-3 weeks or less in the setting of AKI, chronic kidney disease, poor p.o. intake, immobility, acute on chronic biventricular CHF, EF 25 -30%, altered mental status, shock, anemia, osteomyelitis status post amputation with poor wound healing, decubitus ulcers, moderate protein malnutrition, and poor functional status.  Discharge Planning:  To Be Determined Once HD has been d/c'ed son would like patient placed in residential hospice facility for end-of-life care. If continues with HD (which he is not a candidate for long-term per Nephrology) would need SNF placement  with Palliative vs. Hospice.   Care plan was discussed with patient's son, Dr. Jimmy Footman, and bedside RN.   Thank you for allowing the Palliative Medicine Team to assist in the care of this patient.   Total Time 35 min Prolonged Time Billed NO        Greater than 50%  of this time was spent counseling and coordinating care related to the above assessment and plan.  Alda Lea, NP-BC  Please contact Palliative Medicine Team phone at [phone] for questions and concerns.

## 2018-01-12 NOTE — Progress Notes (Signed)
Subjective: Interval History: has no complaint .  Objective: Vital signs in last 24 hours: Temp:  [97.6 F (36.4 C)-97.8 F (36.6 C)] 97.6 F (36.4 C) (07/09 1015) Pulse Rate:  [64-70] 64 (07/09 1015) Resp:  [18] 18 (07/09 1015) BP: (80-116)/(41-90) 95/56 (07/09 1015) SpO2:  [91 %-100 %] 99 % (07/09 1015) Weight:  [87.1 kg (192 lb)] 87.1 kg (192 lb) (07/09 0500) Weight change: 4.191 kg (9 lb 3.8 oz)  Intake/Output from previous day: 07/08 0701 - 07/09 0700 In: 750 [P.O.:720; I.V.:30] Out: 0  Intake/Output this shift: Total I/O In: 120 [P.O.:120] Out: -   General appearance: no distress, pale and confused Resp: diminished breath sounds bilaterally and rhonchi bilaterally Cardio: S1, S2 normal and systolic murmur: systolic ejection 2/6, decrescendo at 2nd left intercostal space GI: soft, non-tender; bowel sounds normal; no masses,  no organomegaly Extremities: edema 2+ and L groin temp cath  Lab Results: Recent Labs    01/11/18 0426 01/12/18 0429  WBC 5.5 5.7  HGB 7.1* 7.4*  HCT 23.6* 24.5*  PLT 56* 66*   BMET:  Recent Labs    01/11/18 0426 01/12/18 0429  NA 127* 128*  K 4.5 4.9  CL 92* 91*  CO2 21* 19*  GLUCOSE 120* 148*  BUN 42* 51*  CREATININE 4.91* 5.99*  CALCIUM 7.8* 7.9*   No results for input(s): PTH in the last 72 hours. Iron Studies: No results for input(s): IRON, TIBC, TRANSFERRIN, FERRITIN in the last 72 hours.  Studies/Results: Dg Chest Port 1 View  Result Date: 01/12/2018 CLINICAL DATA:  67 year old male with shortness breath. Subsequent encounter. EXAM: PORTABLE CHEST 1 VIEW COMPARISON:  01/11/2018 chest x-ray. FINDINGS: Right costophrenic angle not included on present exam. AICD in place with leads unchanged in position. Post CABG. Cardiomegaly. Right central line tip mid to distal superior vena cava. Slightly asymmetric diffuse airspace disease suggestive of pulmonary edema. No gross pneumothorax. No acute osseous abnormality. IMPRESSION:  Similar appearance of diffuse slightly asymmetric airspace disease suggestive of pulmonary edema. Cardiomegaly with pacer in place.  Post CABG. Electronically Signed   By: Genia Del M.D.   On: 01/12/2018 08:40   Dg Chest Port 1 View  Result Date: 01/11/2018 CLINICAL DATA:  Respiratory failure EXAM: PORTABLE CHEST 1 VIEW COMPARISON:  01/10/2018 FINDINGS: AICD unchanged. CABG. Central venous catheter tip in the SVC unchanged Bilateral airspace disease with mild interval improvement. Small bilateral effusions unchanged. IMPRESSION: Improvement in bilateral airspace disease most likely pulmonary edema. Small pleural effusions. Electronically Signed   By: Franchot Gallo M.D.   On: 01/11/2018 07:27    I have reviewed the patient's current medications.  Assessment/Plan: 1 AKI sepsis, bactrim, arb, NSAIDS.  Oliguric , no recovery.  Will plan HD tomorrow if not better.  Discussed with son limit trial HD to 1 wk and then Hospice 2 Anemia 3 Deccub 4 severe debill  5 DM controlled 6 CM 7 CAD P follow chem , limit trial HD to1 wk if not better   LOS: 11 days   Annalei Friesz 01/12/2018,11:24 AM

## 2018-01-13 ENCOUNTER — Encounter: Payer: Medicare HMO | Admitting: Sports Medicine

## 2018-01-13 DIAGNOSIS — D696 Thrombocytopenia, unspecified: Secondary | ICD-10-CM

## 2018-01-13 DIAGNOSIS — G9341 Metabolic encephalopathy: Secondary | ICD-10-CM

## 2018-01-13 DIAGNOSIS — D649 Anemia, unspecified: Secondary | ICD-10-CM

## 2018-01-13 LAB — GLUCOSE, CAPILLARY
GLUCOSE-CAPILLARY: 122 mg/dL — AB (ref 70–99)
GLUCOSE-CAPILLARY: 145 mg/dL — AB (ref 70–99)
GLUCOSE-CAPILLARY: 148 mg/dL — AB (ref 70–99)
Glucose-Capillary: 159 mg/dL — ABNORMAL HIGH (ref 70–99)

## 2018-01-13 LAB — MAGNESIUM: MAGNESIUM: 2.9 mg/dL — AB (ref 1.7–2.4)

## 2018-01-13 LAB — RENAL FUNCTION PANEL
Albumin: 2.2 g/dL — ABNORMAL LOW (ref 3.5–5.0)
Anion gap: 20 — ABNORMAL HIGH (ref 5–15)
BUN: 64 mg/dL — ABNORMAL HIGH (ref 8–23)
CALCIUM: 8 mg/dL — AB (ref 8.9–10.3)
CHLORIDE: 89 mmol/L — AB (ref 98–111)
CO2: 17 mmol/L — ABNORMAL LOW (ref 22–32)
CREATININE: 6.95 mg/dL — AB (ref 0.61–1.24)
GFR calc Af Amer: 8 mL/min — ABNORMAL LOW (ref >60)
GFR calc non Af Amer: 7 mL/min — ABNORMAL LOW (ref >60)
GLUCOSE: 119 mg/dL — AB (ref 70–99)
Phosphorus: 9.2 mg/dL — ABNORMAL HIGH (ref 2.5–4.6)
Potassium: 5.1 mmol/L (ref 3.5–5.1)
SODIUM: 126 mmol/L — AB (ref 135–145)

## 2018-01-13 LAB — CBC WITH DIFFERENTIAL/PLATELET
BASOS PCT: 1 %
Basophils Absolute: 0.1 10*3/uL (ref 0.0–0.1)
EOS PCT: 1 %
Eosinophils Absolute: 0.1 10*3/uL (ref 0.0–0.7)
HCT: 24.9 % — ABNORMAL LOW (ref 39.0–52.0)
HEMOGLOBIN: 7.6 g/dL — AB (ref 13.0–17.0)
LYMPHS PCT: 8 %
Lymphs Abs: 0.5 10*3/uL — ABNORMAL LOW (ref 0.7–4.0)
MCH: 30.8 pg (ref 26.0–34.0)
MCHC: 30.5 g/dL (ref 30.0–36.0)
MCV: 100.8 fL — AB (ref 78.0–100.0)
Monocytes Absolute: 0.8 10*3/uL (ref 0.1–1.0)
Monocytes Relative: 13 %
NEUTROS ABS: 4.5 10*3/uL (ref 1.7–7.7)
Neutrophils Relative %: 77 %
Platelets: 91 10*3/uL — ABNORMAL LOW (ref 150–400)
RBC: 2.47 MIL/uL — ABNORMAL LOW (ref 4.22–5.81)
RDW: 25 % — ABNORMAL HIGH (ref 11.5–15.5)
WBC: 6 10*3/uL (ref 4.0–10.5)

## 2018-01-13 LAB — COMPREHENSIVE METABOLIC PANEL
ALK PHOS: 57 U/L (ref 38–126)
ALT: 20 U/L (ref 0–44)
ANION GAP: 20 — AB (ref 5–15)
AST: 16 U/L (ref 15–41)
Albumin: 2.2 g/dL — ABNORMAL LOW (ref 3.5–5.0)
BILIRUBIN TOTAL: 1.6 mg/dL — AB (ref 0.3–1.2)
BUN: 64 mg/dL — ABNORMAL HIGH (ref 8–23)
CO2: 18 mmol/L — AB (ref 22–32)
CREATININE: 6.91 mg/dL — AB (ref 0.61–1.24)
Calcium: 8.1 mg/dL — ABNORMAL LOW (ref 8.9–10.3)
Chloride: 89 mmol/L — ABNORMAL LOW (ref 98–111)
GFR, EST AFRICAN AMERICAN: 9 mL/min — AB (ref >60)
GFR, EST NON AFRICAN AMERICAN: 7 mL/min — AB (ref >60)
Glucose, Bld: 120 mg/dL — ABNORMAL HIGH (ref 70–99)
Potassium: 5.2 mmol/L — ABNORMAL HIGH (ref 3.5–5.1)
SODIUM: 127 mmol/L — AB (ref 135–145)
TOTAL PROTEIN: 6 g/dL — AB (ref 6.5–8.1)

## 2018-01-13 LAB — PHOSPHORUS: Phosphorus: 9.4 mg/dL — ABNORMAL HIGH (ref 2.5–4.6)

## 2018-01-13 LAB — HEPATITIS B SURFACE ANTIGEN: HEP B S AG: NEGATIVE

## 2018-01-13 MED ORDER — CHLORHEXIDINE GLUCONATE CLOTH 2 % EX PADS: 6.0000 | MEDICATED_PAD | Freq: Every day | CUTANEOUS | Status: DC

## 2018-01-13 NOTE — Progress Notes (Deleted)
Pt complained of "grabbing" 5/10 low back pain after beginning Ambisome.  Stopped antibiotic and advised pharmacist on floor.

## 2018-01-13 NOTE — Progress Notes (Signed)
PT Cancellation and Discharge Note  Patient Details Name: Jose Heath MRN: [id number] DOB: July 31, 1950   Cancelled Treatment:    Reason Eval/Treat Not Completed: Other (comment)   Checked on for appropriateness of PT at this time;   Cancelled Treatment:    Reason Eval/Treat Not Completed: Medical issues which prohibited therapy  Chart reviewed;  Noted temporary  femoral HD catheter still in place;  We typically hold mobilizing with a non-tunneled femoral HD catheter due to risk of losing line;  Will sign off PT, and be happy to proceed with PT eval once temporary femoral HD catheter is out, and if PT has a role in Jose Heath's goals of care -- will take Palliative Care's lead  Please consider re-order PT when temporary femoral HD catheter is dc'd, and pt is appropriate to mobilize if PT is congruent with Goals of Care.  Thank you,   Roney Marion, PT  Acute Rehabilitation Services Pager [phone] Office [phone]    Colletta [place] 01/13/2018, 8:08 AM

## 2018-01-13 NOTE — Care Management Important Message (Signed)
Important Message  Patient Details  Name: Jose Heath MRN: [id number] Date of Birth: [birth year]   Medicare Important Message Given:  No  Due to illness patient not able to sign [name] 01/13/2018, 1:52 PM

## 2018-01-13 NOTE — Progress Notes (Signed)
PROGRESS NOTE    Jose Heath  PNT:[id number] DOB: 1951/07/07 DOA: 01/01/2018 PCP: Patient, No Pcp Per   Brief Narrative:  67 year old with a history of CAD status post CABG, systolic congestive heart failure ejection fraction 25-30% with AICD in place, diabetes mellitus type 2, osteomyelitis of the right first toe status post amputation, A. fib on Eliquis, CKD stage III came from [facility] with complaints of fatigue and no urine output.  He was found to be in septic shock secondary to urinary tract infection complicated by osteomyelitis and AKI on CKD.  He was also hyperkalemic.  Central line was placed, 2 L bolus was given, broad-spectrum antibiotics-vancomycin and Zosyn and pressors were started.  He was also started on CRRT with nephrology recommendation.  He was in biventricular heart failure requiring dobutamine drip.  He also required Precedex and Haldol given extreme agitation but was eventually transitioned to stepdown unit once she was stabilized and weaned off the drips.  Due to patient's chronic comorbidities and poor quality of life, palliative care service was consulted who recommended hospice care.  After prolonged discussion with nephrology team and palliative care service it was determined to try dialysis for about a week and then transition him to residential hospice.   Assessment & Plan:   Active Problems:   Septic shock (HCC)   Pressure injury of skin   Malnutrition of moderate degree  Acute respiratory failure with hypoxia Acute systolic congestive heart failure with pulmonary edema, ejection fraction 25% - Currently patient appears to be close to euvolemic.  He has been off inotropes.  Continue pulmonary toilet, supportive care.  Nebulizer treatments as needed.  Acute kidney injury on CKD stage III Intermittent acute metabolic encephalopathy, likely uremia - Patient is currently requiring intermittent hemodialysis, nephrology team is following.  Appreciate  their input -Plan to dialyze him for about 1 week, if not better then transition him to residential hospice.  Palliative care service is following along as well.  Agitation and delirium, intermittent -Currently he is on quetiapine 50 mg at bedtime.  Continue with this.  Continue sitter at bedside as patient is at risk of trying to get up get out of bed and falling and injuring himself.  History of liver cirrhosis -We will check ammonia levels.  LFTs are within normal limits.  Macrocytic anemia/thrombocytopenia - Closely monitor hemoglobin.  No clear signs of active bleeding.  If hemoglobin below 7 will transfuse him.  Monitor platelets as well.  Sacral decubitus ulcer - Wound care consult appreciated.  Treated with Gerhardt but cream.  Hypothyroidism -Continue Synthroid 100 mcg orally daily  Moderate protein calorie malnutrition -Encourage oral diet, feeding supplements- on Nepro  DVT prophylaxis: SCDs and Prevnar boots Code Status: Now DNR/DNI Family Communication: None at bedside Disposition Plan: Maintain inpatient stay for dialysis.  Eventually transition to hospice if no improvement noted.  Consultants:   Nephrology  Palliative care  Procedures:   None  Antimicrobials:   Status post vancomycin and Zosyn treatment, currently off antibiotics   Subjective: Patient is pleasantly confused this morning.  He is able to tell me his name but rest of the conversation is in meaningful.  Denies any complaints.  Review of Systems Otherwise negative except as per HPI, including: General: Denies fever, chills, night sweats or unintended weight loss. Resp: Denies cough, wheezing, shortness of breath. Cardiac: Denies chest pain, palpitations, orthopnea, paroxysmal nocturnal dyspnea. GI: Denies abdominal pain, nausea, vomiting, diarrhea or constipation GU: Denies dysuria, frequency, hesitancy or incontinence  MS: Denies muscle aches, joint pain or swelling Neuro: Denies  headache, neurologic deficits (focal weakness, numbness, tingling), abnormal gait Psych: Denies anxiety, depression, SI/HI/AVH Skin: Denies new rashes or lesions ID: Denies sick contacts, exotic exposures, travel  Objective: Vitals:   01/12/18 1015 01/12/18 2059 01/13/18 0455 01/13/18 0816  BP: (!) 95/56 99/64 109/64 107/61  Pulse: 64 67 71 70  Resp: 18 18 18  (!) 22  Temp: 97.6 F (36.4 C) 97.6 F (36.4 C) 97.8 F (36.6 C) 98.4 F (36.9 C)  TempSrc: Oral Oral Oral Oral  SpO2: 99% 100% 100% 100%  Weight:      Height:        Intake/Output Summary (Last 24 hours) at 01/13/2018 0927 Last data filed at 01/12/2018 1230 Gross per 24 hour  Intake 120 ml  Output -  Net 120 ml   Filed Weights   01/10/18 0326 01/11/18 0409 01/12/18 0500  Weight: 82.9 kg (182 lb 12.2 oz) 82.9 kg (182 lb 12.2 oz) 87.1 kg (192 lb)    Examination:  General exam: Chronically ill-appearing, calm and pleasantly confused. Respiratory system: Diminished breath sounds bilaterally at the bases. Cardiovascular system: S1 & S2 heard, RRR. No JVD, positive systolic murmurs, rubs, gallops or clicks.  1+ bilateral lower extremity pitting edema Gastrointestinal system: Abdomen is nondistended, soft and nontender. No organomegaly or masses felt. Normal bowel sounds heard. Central nervous system: Alert and oriented x 1-2. No focal neurological deficits. Extremities: Symmetric 4 x 5 power.  Amputated toe noted. Skin: No rashes, lesions or ulcers Psychiatry: Poor judgment and insight.  Data Reviewed:   CBC: Recent Labs  Lab 01/08/18 0410 01/09/18 0408 01/10/18 0445 01/11/18 0426 01/12/18 0429 01/13/18 0433  WBC 4.6 5.6 5.2 5.5 5.7 6.0  NEUTROABS 3.6  --  3.9 3.9 4.3 4.5  HGB 7.7* 7.6* 7.0* 7.1* 7.4* 7.6*  HCT 25.7* 24.9* 23.0* 23.6* 24.5* 24.9*  MCV 101.6* 100.0 99.1 100.4* 101.7* 100.8*  PLT 70* 58* 51* 56* 66* 91*   Basic Metabolic Panel: Recent Labs  Lab 01/09/18 0408 01/10/18 0445 01/11/18 0426  01/12/18 0429 01/13/18 0433  NA 130* 128* 127* 128* 126*  127*  K 3.9 4.1 4.5 4.9 5.1  5.2*  CL 96* 93* 92* 91* 89*  89*  CO2 25 24 21* 19* 17*  18*  GLUCOSE 100* 128* 120* 148* 119*  120*  BUN 15 27* 42* 51* 64*  64*  CREATININE 2.47* 3.72* 4.91* 5.99* 6.95*  6.91*  CALCIUM 7.5* 7.8* 7.8* 7.9* 8.0*  8.1*  MG 2.3 2.5* 2.4 2.8* 2.9*  PHOS 3.5 5.0* 6.6* 7.9* 9.2*  9.4*   GFR: Estimated Creatinine Clearance: 11.4 mL/min (A) (by C-G formula based on SCr of 6.95 mg/dL (H)). Liver Function Tests: Recent Labs  Lab 01/08/18 0410 01/09/18 0408 01/11/18 0426 01/12/18 0429 01/13/18 0433  AST  --   --   --  17 16  ALT  --   --   --  21 20  ALKPHOS  --   --   --  62 57  BILITOT  --   --   --  1.6* 1.6*  PROT  --   --   --  6.3* 6.0*  ALBUMIN 2.5* 2.3* 2.3* 2.2* 2.2*  2.2*   No results for input(s): LIPASE, AMYLASE in the last 168 hours. Recent Labs  Lab 01/09/18 1147  AMMONIA 43*   Coagulation Profile: No results for input(s): INR, PROTIME in the last 168 hours. Cardiac Enzymes: No  results for input(s): CKTOTAL, CKMB, CKMBINDEX, TROPONINI in the last 168 hours. BNP (last 3 results) No results for input(s): PROBNP in the last 8760 hours. HbA1C: No results for input(s): HGBA1C in the last 72 hours. CBG: Recent Labs  Lab 01/12/18 0744 01/12/18 1124 01/12/18 1729 01/12/18 2055 01/13/18 0806  GLUCAP 151* 164* 149* 144* 122*   Lipid Profile: No results for input(s): CHOL, HDL, LDLCALC, TRIG, CHOLHDL, LDLDIRECT in the last 72 hours. Thyroid Function Tests: No results for input(s): TSH, T4TOTAL, FREET4, T3FREE, THYROIDAB in the last 72 hours. Anemia Panel: No results for input(s): VITAMINB12, FOLATE, FERRITIN, TIBC, IRON, RETICCTPCT in the last 72 hours. Sepsis Labs: No results for input(s): PROCALCITON, LATICACIDVEN in the last 168 hours.  No results found for this or any previous visit (from the past 240 hour(s)).       Radiology Studies: Dg Chest Port 1  View  Result Date: 01/12/2018 CLINICAL DATA:  67 year old male with shortness breath. Subsequent encounter. EXAM: PORTABLE CHEST 1 VIEW COMPARISON:  01/11/2018 chest x-ray. FINDINGS: Right costophrenic angle not included on present exam. AICD in place with leads unchanged in position. Post CABG. Cardiomegaly. Right central line tip mid to distal superior vena cava. Slightly asymmetric diffuse airspace disease suggestive of pulmonary edema. No gross pneumothorax. No acute osseous abnormality. IMPRESSION: Similar appearance of diffuse slightly asymmetric airspace disease suggestive of pulmonary edema. Cardiomegaly with pacer in place.  Post CABG. Electronically Signed   By: Genia Del M.D.   On: 01/12/2018 08:40        Scheduled Meds: . Chlorhexidine Gluconate Cloth  6 each Topical Daily  . collagenase   Topical BID  . feeding supplement (NEPRO CARB STEADY)  237 mL Oral BID BM  . insulin aspart  0-5 Units Subcutaneous QHS  . insulin aspart  0-9 Units Subcutaneous TID WC  . levothyroxine  100 mcg Oral QAC breakfast  . mouth rinse  15 mL Mouth Rinse BID  . QUEtiapine  50 mg Oral QHS  . sodium chloride flush  10-40 mL Intracatheter Q12H   Continuous Infusions: . sodium chloride 250 mL (01/11/18 2212)     LOS: 12 days    I have spent 35 minutes face to face with the patient and on the ward discussing the patients care, assessment, plan and disposition with other care givers. >50% of the time was devoted counseling the patient about the risks and benefits of treatment and coordinating care.     Kasem Mozer Arsenio Loader, MD [facility] Pager [phone]   If 7PM-7AM, please contact night-coverage [email or web address] Password [facility] 01/13/2018, 9:27 AM

## 2018-01-13 NOTE — Progress Notes (Signed)
OT Cancellation Note  Patient Details Name: Jose Heath MRN: [id number] DOB: [birth year]    Cancelled Treatment:    Reason Eval/Treat Not Completed: Medical issues which prohibited therapy;Patient at procedure or test/ unavailable. Pt also with a non-tunneled femoral HD catheter. OT will sign off, please re order when appropriate     Britt Bottom 01/13/2018, 2:01 PM

## 2018-01-13 NOTE — Progress Notes (Signed)
Daily Progress Note   Patient Name: Beren Yniguez       Date: 01/13/2018 DOB: [birth year]  Age: 67 y.o. MRN#: [id number] Attending Physician: Damita Lack, MD Primary Care Physician: Patient, No Pcp Per Admit Date: 01/01/2018  Reason for Consultation/Follow-up: Establishing goals of care  Subjective: Patient is lying in bed awake. He continues to point across the room and states "what happened to his tomatoes". Sitter at the bedside. Patient is alert and pleasant. He is able to tell me his full name, that he has one son and his name is Aaron Edelman, and that  Aaron Edelman lives close to [place] and he leaves in [place] near a nursing home. He is unable to tell me his dob, location, and who the president is. No family is at the bedside. Updated the son via phone with potential plan per Nephrology to dialyze him on today and will continue to assess appropriateness for continuation each day. He verbalized understanding and awareness of the plan of care and the week long timed trial.   Chart Reviewed and report received from bedside RN.   Length of Stay: 12  Current Medications: Scheduled Meds:  . Chlorhexidine Gluconate Cloth  6 each Topical Daily  . collagenase   Topical BID  . feeding supplement (NEPRO CARB STEADY)  237 mL Oral BID BM  . insulin aspart  0-5 Units Subcutaneous QHS  . insulin aspart  0-9 Units Subcutaneous TID WC  . levothyroxine  100 mcg Oral QAC breakfast  . mouth rinse  15 mL Mouth Rinse BID  . QUEtiapine  50 mg Oral QHS  . sodium chloride flush  10-40 mL Intracatheter Q12H    Continuous Infusions: . sodium chloride 250 mL (01/11/18 2212)    PRN Meds: sodium chloride, acetaminophen, albuterol, Gerhardt's butt cream, haloperidol lactate, ondansetron (ZOFRAN) IV, sodium  chloride flush, traMADol  Physical Exam  Skin: Skin is warm and dry. Bruising noted.  Scattered bruising, with protective dressings. Bilateral boots.   Constitutional: He appears cachectic. He is uncooperative. He appears fragile and chronically ill. Somewhat more lethargic today.  Cardiovascular: Regular rhythm and normal heart sounds. Exam reveals decreased pulses. Systolic murmur.  Pulmonary/Chest: Effort normal. He has decreased breath sounds. Rhonchi+.  Musculoskeletal:  Generalized weakness, great toe amputation. BLE edema. Neurological: More alert today. Alert to self. Remains  confused.  Psychiatric: His speech is normal. Cognition and memory are impaired. He expresses inappropriate judgment.  Nursing note and vitals reviewed.  Vital Signs: BP 107/61 (BP Location: Left Arm)   Pulse 70   Temp 98.4 F (36.9 C) (Oral)   Resp (!) 22   Ht 5\' 9"  (1.753 m)   Wt 87.1 kg (192 lb)   SpO2 100%   BMI 28.35 kg/m  SpO2: SpO2: 100 % O2 Device: O2 Device: Nasal Cannula O2 Flow Rate: O2 Flow Rate (L/min): 2 L/min  Intake/output summary:   Intake/Output Summary (Last 24 hours) at 01/13/2018 1112 Last data filed at 01/12/2018 1230 Gross per 24 hour  Intake 120 ml  Output -  Net 120 ml   LBM: Last BM Date: 01/12/18 Baseline Weight: Weight: 98.9 kg (218 lb 0.6 oz) Most recent weight: Weight: 87.1 kg (192 lb)   Palliative Assessment/Data: PPS 20%   Flowsheet Rows     Most Recent Value  Intake Tab  Referral Department  Hospitalist  Unit at Time of Referral  ICU  Palliative Care Primary Diagnosis  Nephrology  Date Notified  01/08/18  Palliative Care Type  New Palliative care  Reason for referral  Clarify Goals of Care  Date of Admission  01/01/18  Date first seen by Palliative Care  01/10/18  # of days Palliative referral response time  2 Day(s)  # of days IP prior to Palliative referral  7  Clinical Assessment  Psychosocial & Spiritual Assessment  Palliative Care Outcomes       Patient Active Problem List   Diagnosis Date Noted  . Malnutrition of moderate degree 01/07/2018  . Pressure injury of skin 01/02/2018  . Septic shock ([place]) 01/01/2018  . Arthritis 09/17/2016  . Atrial flutter ([place]) 09/17/2016  . Coronary artery disease 09/17/2016  . Diabetes mellitus ([place]) 09/17/2016  . Disease of thyroid gland 09/17/2016  . HOH (hard of hearing) 09/17/2016  . Hyperlipidemia 09/17/2016  . ICD (implantable cardioverter-defibrillator) in place 09/17/2016  . Left foot pain 09/17/2016  . Skin cancer 09/17/2016  . TSH (thyroid-stimulating hormone deficiency) 08/13/2016  . Anasarca 07/30/2016  . Charcot foot due to diabetes mellitus ([place]) 02/13/2016  . Atypical atrial flutter ([place]) 10/04/2015  . Essential hypertension 10/04/2015  . Ischemic cardiomyopathy 10/04/2015  . Pure hypercholesterolemia 10/04/2015  . Amputated toe of left foot ([place]) 08/15/2015  . Diabetic peripheral neuropathy ([place]) 08/15/2015  . Type 2 diabetes mellitus with Charcot's joint of left foot ([place]) 08/15/2015  . Cardiac defibrillator in place 05/29/2015  . Cardiomyopathy ([place]) 05/29/2015  . Dual ICD (implantable cardioverter-defibrillator) in place 05/29/2015    Palliative Care Assessment & Plan   Patient Profile: 67 y.o. male admitted on 01/01/2018 from [facility] with complaints of fatigue and no urine output x1day. He has a past medical history significant for AICD, diabetes, A-fib (on eliquis), CKD stage 3, Osteomyelitis of right first toe s/p transmetatarsal amputation, CHF (EF 25-30%), and CAD s/p CABG. Since admission was found to be in septic shock due to UTI +/- Osteo, AKI-on-CKD, and Hyperkalemia. A Central line was placed. Patient was given 2L IVF bolus and started on broad spectrum antibiotics (Vanc and Zosyn) and vasopressors (Levophed and Dobutamine). He was transferred to [place] here at [facility] for continued care and consideration of dialysis. He is now being followed by  Nephrology. He was started on CVVHD on 6/28 and came off on 7/5. BUN/CR continues to increase despite discontinuation of pressors. Palliative Medicine consulted for  goals of care discussion.   Recommendations/Plan:  DNR/DNI-at son's request. Son states he has discussed with sisters and they are all in agreement.    Continue to treat the treatable.  Son is hopeful patient will be able to receive additional HD, with hopes of improving his mental status and allowing family members to have at least one more lucid conversation with him and allow them to tell him that they love them. He is aware that per Nephrology patient will receive at least another week of HD to see if he shows signs of improvement and tolerance. If there is not any further changes or he continues to not be able to tolerate treatment, hospice should be considered. Son verbalized understanding and states he would like to at least attempt to continue with dialysis for another week and if Nephrology feels it is not appropriate at that time he would like to have him placed at residential hospice facility. Both son and sisters state they can not care for him in the home and emotionally/physically provide end of life care.   Plan per Nephrology to received HD today.   Palliative will continue to support patient, family, and medical team during hospitalization.   Goals of Care and Additional Recommendations:  Limitations on Scope of Treatment: Full Scope Treatment- continue to treat the treatable without escalation of care.   Code Status:    Code Status Orders  (From admission, onward)        Start     Ordered   01/01/18 1935  Full code  Continuous     01/01/18 1938    Code Status History    This patient has a current code status but no historical code status.     Prognosis:   6 months or less in the setting of continuation of HD. If discontinuation of dialysis 2-3 weeks or less in the setting of AKI, chronic kidney disease,  poor p.o. intake, immobility, acute on chronic biventricular CHF, EF 25 -30%, altered mental status, shock, anemia, osteomyelitis status post amputation with poor wound healing, decubitus ulcers, moderate protein malnutrition, and poor functional status.  Discharge Planning:  To Be Determined Once HD has been d/c'ed son would like patient placed in residential hospice facility for end-of-life care. If continues with HD (which he is not a candidate for long-term per Nephrology) would need SNF placement with Palliative vs. Hospice.   Care plan was discussed with patient's son, and bedside RN.   Thank you for allowing the Palliative Medicine Team to assist in the care of this patient.   Total Time 35 min Prolonged Time Billed NO        Greater than 50%  of this time was spent counseling and coordinating care related to the above assessment and plan.  Alda Lea, NP-BC Palliative Medicine Team  Phone: [phone] Fax: [phone] Pager: [phone] Amion: Bjorn Pippin    Please contact Palliative Medicine Team phone at [phone] for questions and concerns.

## 2018-01-13 NOTE — Progress Notes (Signed)
[facility] Progress Note    Assessment/ Plan:   67y/o man with osteo rt 1st toe s/p TMA, afib, CKD III, CHF EF 25-30%, CASHD here with septic shock ruosepsis, hyperkalemia and found to have cirrhosis as well CRRT initially but now off.  1 AKI sepsis, bactrim, arb, NSAIDS.  Oliguric , no recovery.  Will plan HD tomorrow if not better. - Dr. Jimmy Footman already discussed with son limit trial HD to 1 wk and then Hospice if no signs of improvement. -  Will check bladder scan today as well - Plan on iHD today with rising K; will not place on standing regimen but will reassess each day.  - Not a candidate for long term outpatient hemodialysis. 2 Anemia 3 Deccub 4 severe debill  5 DM controlled 6 CM 7 CAD P follow chem , limit trial HD to1 wk if not better    Subjective:   Denies f/c/n/v/dyspnea/cp. Nonproductive cough.   Objective:   BP 107/61 (BP Location: Left Arm)   Pulse 70   Temp 98.4 F (36.9 C) (Oral)   Resp (!) 22   Ht 5\' 9"  (1.753 m)   Wt 87.1 kg (192 lb)   SpO2 100%   BMI 28.35 kg/m   Intake/Output Summary (Last 24 hours) at 01/13/2018 0858 Last data filed at 01/12/2018 1230 Gross per 24 hour  Intake 120 ml  Output -  Net 120 ml   Weight change:   Physical Exam: General appearance: no distress, pale and confused Resp: diminished breath sounds bilaterally and rhonchi bilaterally Cardio: S1, S2 normal and systolic murmur: systolic ejection 2/6, decrescendo at 2nd left intercostal space GI: soft, non-tender; bowel sounds normal; no masses,  no organomegaly Extremities: edema 1+ and L groin temp cath    Imaging: Dg Chest Port 1 View  Result Date: 01/12/2018 CLINICAL DATA:  67 year old male with shortness breath. Subsequent encounter. EXAM: PORTABLE CHEST 1 VIEW COMPARISON:  01/11/2018 chest x-ray. FINDINGS: Right costophrenic angle not included on present exam. AICD in place with leads unchanged in position. Post CABG. Cardiomegaly. Right central  line tip mid to distal superior vena cava. Slightly asymmetric diffuse airspace disease suggestive of pulmonary edema. No gross pneumothorax. No acute osseous abnormality. IMPRESSION: Similar appearance of diffuse slightly asymmetric airspace disease suggestive of pulmonary edema. Cardiomegaly with pacer in place.  Post CABG. Electronically Signed   By: Genia Del M.D.   On: 01/12/2018 08:40    Labs: BMET Recent Labs  Lab 01/07/18 1630 01/08/18 0410 01/09/18 0408 01/10/18 0445 01/11/18 0426 01/12/18 0429 01/13/18 0433  NA 134* 134* 130* 128* 127* 128* 126*  127*  K 3.6 3.7 3.9 4.1 4.5 4.9 5.1  5.2*  CL 98 98 96* 93* 92* 91* 89*  89*  CO2 27 27 25 24  21* 19* 17*  18*  GLUCOSE 158* 87 100* 128* 120* 148* 119*  120*  BUN 7* 6* 15 27* 42* 51* 64*  64*  CREATININE 1.42* 1.37* 2.47* 3.72* 4.91* 5.99* 6.95*  6.91*  CALCIUM 7.4* 7.4* 7.5* 7.8* 7.8* 7.9* 8.0*  8.1*  PHOS 2.8 2.4* 3.5 5.0* 6.6* 7.9* 9.2*  9.4*   CBC Recent Labs  Lab 01/10/18 0445 01/11/18 0426 01/12/18 0429 01/13/18 0433  WBC 5.2 5.5 5.7 6.0  NEUTROABS 3.9 3.9 4.3 4.5  HGB 7.0* 7.1* 7.4* 7.6*  HCT 23.0* 23.6* 24.5* 24.9*  MCV 99.1 100.4* 101.7* 100.8*  PLT 51* 56* 66* 91*    Medications:    . Chlorhexidine Gluconate  Cloth  6 each Topical Daily  . Chlorhexidine Gluconate Cloth  6 each Topical Q0600  . collagenase   Topical BID  . feeding supplement (NEPRO CARB STEADY)  237 mL Oral BID BM  . insulin aspart  0-5 Units Subcutaneous QHS  . insulin aspart  0-9 Units Subcutaneous TID WC  . levothyroxine  100 mcg Oral QAC breakfast  . mouth rinse  15 mL Mouth Rinse BID  . QUEtiapine  50 mg Oral QHS  . sodium chloride flush  10-40 mL Intracatheter Q12H      Otelia Santee, MD 01/13/2018, 8:58 AM

## 2018-01-13 NOTE — Care Management Important Message (Signed)
Important Message  Patient Details  Name: Jose Heath MRN: [id number] Date of Birth: 08-02-1950   Medicare Important Message Given:  No    [name] 01/13/2018, 1:52 PM

## 2018-01-14 ENCOUNTER — Inpatient Hospital Stay (HOSPITAL_COMMUNITY): Payer: Medicare HMO

## 2018-01-14 DIAGNOSIS — L8991 Pressure ulcer of unspecified site, stage 1: Secondary | ICD-10-CM

## 2018-01-14 LAB — CBC
HEMATOCRIT: 25 % — AB (ref 39.0–52.0)
HEMOGLOBIN: 7.6 g/dL — AB (ref 13.0–17.0)
MCH: 30.5 pg (ref 26.0–34.0)
MCHC: 30.4 g/dL (ref 30.0–36.0)
MCV: 100.4 fL — AB (ref 78.0–100.0)
PLATELETS: 111 10*3/uL — AB (ref 150–400)
RBC: 2.49 MIL/uL — AB (ref 4.22–5.81)
RDW: 25.8 % — ABNORMAL HIGH (ref 11.5–15.5)
WBC: 6.3 10*3/uL (ref 4.0–10.5)

## 2018-01-14 LAB — RENAL FUNCTION PANEL
Albumin: 2.2 g/dL — ABNORMAL LOW (ref 3.5–5.0)
Anion gap: 19 — ABNORMAL HIGH (ref 5–15)
BUN: 44 mg/dL — ABNORMAL HIGH (ref 8–23)
CHLORIDE: 91 mmol/L — AB (ref 98–111)
CO2: 21 mmol/L — AB (ref 22–32)
CREATININE: 5.8 mg/dL — AB (ref 0.61–1.24)
Calcium: 7.9 mg/dL — ABNORMAL LOW (ref 8.9–10.3)
GFR calc non Af Amer: 9 mL/min — ABNORMAL LOW (ref >60)
GFR, EST AFRICAN AMERICAN: 11 mL/min — AB (ref >60)
Glucose, Bld: 144 mg/dL — ABNORMAL HIGH (ref 70–99)
Phosphorus: 6.5 mg/dL — ABNORMAL HIGH (ref 2.5–4.6)
Potassium: 4 mmol/L (ref 3.5–5.1)
SODIUM: 131 mmol/L — AB (ref 135–145)

## 2018-01-14 LAB — GLUCOSE, CAPILLARY
GLUCOSE-CAPILLARY: 171 mg/dL — AB (ref 70–99)
GLUCOSE-CAPILLARY: 87 mg/dL (ref 70–99)
Glucose-Capillary: 146 mg/dL — ABNORMAL HIGH (ref 70–99)
Glucose-Capillary: 141 mg/dL — ABNORMAL HIGH (ref 70–99)

## 2018-01-14 LAB — MAGNESIUM: Magnesium: 2.5 mg/dL — ABNORMAL HIGH (ref 1.7–2.4)

## 2018-01-14 LAB — HEPATITIS B SURFACE ANTIBODY,QUALITATIVE: Hep B S Ab: REACTIVE

## 2018-01-14 LAB — HEPATITIS B CORE ANTIBODY, TOTAL: Hep B Core Total Ab: NEGATIVE

## 2018-01-14 MED ORDER — SODIUM CHLORIDE 0.9% FLUSH
10.0000 mL | Freq: Two times a day (BID) | INTRAVENOUS | Status: DC
  Administered 2018-01-17 – 2018-01-18 (×2): 10 mL

## 2018-01-14 MED ORDER — SODIUM CHLORIDE 0.9% FLUSH
10.0000 mL | INTRAVENOUS | Status: DC | PRN
  Administered 2018-01-19: 20 mL
  Administered 2018-01-21: 10 mL
  Filled 2018-01-14 (×2): qty 40

## 2018-01-14 NOTE — Progress Notes (Signed)
Discussed with patient's nurse Darylene Price RN regarding IV access. Pt at this time does not have IV medications due at this time. Due to patient having limited access and poor veinous status VAST and floor nurse agreed to hold off on placing another line d/t patient losing access repeatedly. This nurse requested that Darylene Price RN touch base with nephrology to ask weather or not pigtail might be accessed for fluids and labs. VU. Fran Lowes, RN VAST

## 2018-01-14 NOTE — Progress Notes (Signed)
PROGRESS NOTE    Jose Heath  FFM:[id number] DOB: 13-Oct-1950 DOA: 01/01/2018 PCP: Patient, No Pcp Per   Brief Narrative:  67 year old with a history of CAD status post CABG, systolic congestive heart failure ejection fraction 25-30% with AICD in place, diabetes mellitus type 2, osteomyelitis of the right first toe status post amputation, A. fib on Eliquis, CKD stage III came from [facility] with complaints of fatigue and no urine output.  He was found to be in septic shock secondary to urinary tract infection complicated by osteomyelitis and AKI on CKD.  He was also hyperkalemic.  Central line was placed, 2 L bolus was given, broad-spectrum antibiotics-vancomycin and Zosyn and pressors were started.  He was also started on CRRT with nephrology recommendation.  He was in biventricular heart failure requiring dobutamine drip.  He also required Precedex and Haldol given extreme agitation but was eventually transitioned to stepdown unit once she was stabilized and weaned off the drips.  Due to patient's chronic comorbidities and poor quality of life, palliative care service was consulted who recommended hospice care.  After prolonged discussion with nephrology team and palliative care service it was determined to try dialysis for about a week and then transition him to residential hospice.   Assessment & Plan:   Active Problems:   Septic shock (HCC)   Pressure injury of skin   Malnutrition of moderate degree   Acute respiratory distress with hypoxia, improved Acute systolic congestive heart failure with pulmonary edema, ejection fraction 25% -Patient is close to euvolemic.  Does not have any chest complaints at this time.  He is off inotropes.  Nebulizer treatments as needed.  Acute kidney injury on CKD stage III Intermittent acute metabolic encephalopathy, dementia/delirium with uremia -Currently patient is getting intermittent hemodialysis.  Nephrology team is following.  Plan  is to dialyze him for 1 week and if not any better will transition him to residential hospice.  Appreciate palliative care team input as well.  Agitation and delirium, intermittent -Currently he is on quetiapine 50 mg at bedtime.  Continue with this.  Continue sitter at bedside as patient is at risk of trying to get up get out of bed and falling and injuring himself.  History of liver cirrhosis -We will check ammonia levels.  LFTs are within normal limits.  Macrocytic anemia/thrombocytopenia - Closely monitor hemoglobin.  No clear signs of active bleeding.  If hemoglobin below 7 will transfuse him.  Monitor platelets as well.  Sacral decubitus ulcer - Wound care consult appreciated.  Treated with Gerhardt but cream.  Hypothyroidism -Continue Synthroid 100 mcg orally daily  Moderate protein calorie malnutrition -Encourage oral diet, feeding supplements- on Nepro  DVT prophylaxis: SCDs and Prevnar boots Code Status: Now DNR/DNI Family Communication: None at bedside Disposition Plan: Inpatient stay for dialysis.  Consultants:   Nephrology  Palliative care  Procedures:   None  Antimicrobials:   Status post vancomycin and Zosyn treatment, currently off antibiotics   Subjective: Patient pulled out his right IJ central line overnight.  Currently dressing is in place without any signs of active bleeding.  He does have peripheral IVs in place.  No complaints.  Review of Systems Otherwise negative except as per HPI, including: General = no fevers, chills, dizziness, malaise, fatigue HEENT/EYES = negative for pain, redness, loss of vision, double vision, blurred vision, loss of hearing, sore throat, hoarseness, dysphagia Cardiovascular= negative for chest pain, palpitation, murmurs, lower extremity swelling Respiratory/lungs= negative for shortness of breath, cough, hemoptysis, wheezing,  mucus production Gastrointestinal= negative for nausea, vomiting,, abdominal pain, melena,  hematemesis Genitourinary= negative for Dysuria, Hematuria, Change in Urinary Frequency MSK = Negative for arthralgia, myalgias, Back Pain, Joint swelling  Neurology= Negative for headache, seizures, numbness, tingling  Psychiatry= Negative for anxiety, depression, suicidal and homocidal ideation Allergy/Immunology= Medication/Food allergy as listed  Skin= Negative for Rash, lesions, ulcers, itching   Objective: Vitals:   01/13/18 2042 01/14/18 0359 01/14/18 0500 01/14/18 0900  BP: (!) 100/59 (!) 99/59  100/67  Pulse: 69 65  70  Resp: 16 16  (!) 24  Temp: 98.3 F (36.8 C) 97.9 F (36.6 C)  98.1 F (36.7 C)  TempSrc:  Oral  Oral  SpO2: 100% 91%  97%  Weight:   98 kg (216 lb 0.8 oz)   Height:        Intake/Output Summary (Last 24 hours) at 01/14/2018 0958 Last data filed at 01/14/2018 0600 Gross per 24 hour  Intake 180 ml  Output 2000 ml  Net -1820 ml   Filed Weights   01/12/18 0500 01/13/18 1400 01/14/18 0500  Weight: 87.1 kg (192 lb) 98.9 kg (218 lb 0.6 oz) 98 kg (216 lb 0.8 oz)    Examination: Constitutional: NAD, calm, comfortable, chronically appearing ill patient Eyes: PERRL, lids and conjunctivae normal ENMT: Mucous membranes are moist. Posterior pharynx clear of any exudate or lesions.Normal dentition.  Neck: normal, supple, no masses, no thyromegaly Respiratory: clear to auscultation bilaterally, no wheezing, no crackles. Normal respiratory effort. No accessory muscle use.  Cardiovascular: Regular rate and rhythm, no murmurs / rubs / gallops.1+ b/l LE pitting extremity edema. 2+ pedal pulses. No carotid bruits.  Abdomen: no tenderness, no masses palpated. No hepatosplenomegaly. Bowel sounds positive.  Musculoskeletal: no clubbing / cyanosis. No joint deformity upper and lower extremities. Good ROM, no contractures. Normal muscle tone.  Skin: no rashes, lesions, ulcers. No induration Neurologic: CN 2-12 grossly intact. Sensation intact, DTR normal. Strength 4/5 in  all 4.  Psychiatric: AAOx2, poor judgement.   Data Reviewed:   CBC: Recent Labs  Lab 01/08/18 0410  01/10/18 0445 01/11/18 0426 01/12/18 0429 01/13/18 0433 01/14/18 0611  WBC 4.6   < > 5.2 5.5 5.7 6.0 6.3  NEUTROABS 3.6  --  3.9 3.9 4.3 4.5  --   HGB 7.7*   < > 7.0* 7.1* 7.4* 7.6* 7.6*  HCT 25.7*   < > 23.0* 23.6* 24.5* 24.9* 25.0*  MCV 101.6*   < > 99.1 100.4* 101.7* 100.8* 100.4*  PLT 70*   < > 51* 56* 66* 91* 111*   < > = values in this interval not displayed.   Basic Metabolic Panel: Recent Labs  Lab 01/10/18 0445 01/11/18 0426 01/12/18 0429 01/13/18 0433 01/14/18 0611  NA 128* 127* 128* 126*  127* 131*  K 4.1 4.5 4.9 5.1  5.2* 4.0  CL 93* 92* 91* 89*  89* 91*  CO2 24 21* 19* 17*  18* 21*  GLUCOSE 128* 120* 148* 119*  120* 144*  BUN 27* 42* 51* 64*  64* 44*  CREATININE 3.72* 4.91* 5.99* 6.95*  6.91* 5.80*  CALCIUM 7.8* 7.8* 7.9* 8.0*  8.1* 7.9*  MG 2.5* 2.4 2.8* 2.9* 2.5*  PHOS 5.0* 6.6* 7.9* 9.2*  9.4* 6.5*   GFR: Estimated Creatinine Clearance: 14.5 mL/min (A) (by C-G formula based on SCr of 5.8 mg/dL (H)). Liver Function Tests: Recent Labs  Lab 01/09/18 0408 01/11/18 2774 01/12/18 0429 01/13/18 0433 01/14/18 0611  AST  --   --  17 16  --   ALT  --   --  21 20  --   ALKPHOS  --   --  62 57  --   BILITOT  --   --  1.6* 1.6*  --   PROT  --   --  6.3* 6.0*  --   ALBUMIN 2.3* 2.3* 2.2* 2.2*  2.2* 2.2*   No results for input(s): LIPASE, AMYLASE in the last 168 hours. Recent Labs  Lab 01/09/18 1147  AMMONIA 43*   Coagulation Profile: No results for input(s): INR, PROTIME in the last 168 hours. Cardiac Enzymes: No results for input(s): CKTOTAL, CKMB, CKMBINDEX, TROPONINI in the last 168 hours. BNP (last 3 results) No results for input(s): PROBNP in the last 8760 hours. HbA1C: No results for input(s): HGBA1C in the last 72 hours. CBG: Recent Labs  Lab 01/13/18 0806 01/13/18 1209 01/13/18 1854 01/13/18 2044 01/14/18 0757  GLUCAP  122* 159* 145* 148* 146*   Lipid Profile: No results for input(s): CHOL, HDL, LDLCALC, TRIG, CHOLHDL, LDLDIRECT in the last 72 hours. Thyroid Function Tests: No results for input(s): TSH, T4TOTAL, FREET4, T3FREE, THYROIDAB in the last 72 hours. Anemia Panel: No results for input(s): VITAMINB12, FOLATE, FERRITIN, TIBC, IRON, RETICCTPCT in the last 72 hours. Sepsis Labs: No results for input(s): PROCALCITON, LATICACIDVEN in the last 168 hours.  No results found for this or any previous visit (from the past 240 hour(s)).       Radiology Studies: No results found.      Scheduled Meds: . Chlorhexidine Gluconate Cloth  6 each Topical Daily  . collagenase   Topical BID  . feeding supplement (NEPRO CARB STEADY)  237 mL Oral BID BM  . insulin aspart  0-5 Units Subcutaneous QHS  . insulin aspart  0-9 Units Subcutaneous TID WC  . levothyroxine  100 mcg Oral QAC breakfast  . mouth rinse  15 mL Mouth Rinse BID  . QUEtiapine  50 mg Oral QHS  . sodium chloride flush  10-40 mL Intracatheter Q12H  . sodium chloride flush  10-40 mL Intracatheter Q12H   Continuous Infusions: . sodium chloride 250 mL (01/11/18 2212)     LOS: 13 days    I have spent 20 minutes face to face with the patient and on the ward discussing the patients care, assessment, plan and disposition with other care givers. >50% of the time was devoted counseling the patient about the risks and benefits of treatment and coordinating care.     Ankit Arsenio Loader, MD [facility] Pager [phone]   If 7PM-7AM, please contact night-coverage [email or web address] Password [facility] 01/14/2018, 9:58 AM

## 2018-01-14 NOTE — Progress Notes (Signed)
[facility] Progress Note    Assessment/ Plan:   66y/o man with osteo rt 1st toe s/p TMA, afib, CKD III, CHF EF 25-30%, CASHD here with septic shock ruosepsis, hyperkalemia and found to have cirrhosis as well CRRT initially but now off.  1 AKI sepsis, bactrim, arb, NSAIDS. Oliguric , no recovery. Will plan HD tomorrow if not better. -Dr. Jimmy Footman already discussed with son limit trial HD to 1 wk and then Hospice if no signs of improvement. -  Will check bladder scan as well -> didn't see a recent one recorded. -  Last iHD Wednesday; will check tomorrow for any signs of recovery. We will give it till mid week to determine if there's any signs of recovery. If no recovery then will withdrawal HD as he's not a candidate for long term outpatient hemodialysis. 2 Anemia 3 Deccub 4 severe debill 5 DM controlled 6 CM 7 CAD    Subjective:   Denies f/c/n/v/dyspnea; family bedside    Objective:   BP 100/67 (BP Location: Right Arm)   Pulse 70   Temp 98.1 F (36.7 C) (Oral)   Resp (!) 24   Ht 5\' 9"  (1.753 m)   Wt 98 kg (216 lb 0.8 oz)   SpO2 97%   BMI 31.91 kg/m   Intake/Output Summary (Last 24 hours) at 01/14/2018 1255 Last data filed at 01/14/2018 0600 Gross per 24 hour  Intake 180 ml  Output 2000 ml  Net -1820 ml   Weight change:   Physical Exam: General appearance:no distress, pale andconfused Resp:diminished breath soundsbilaterallyand rhonchi bilaterally Cardio:S1, S2 normal and systolic murmur:systolic [id number] 2nd left intercostal space UQ:JFHL, non-tender; bowel sounds normal; no masses, no organomegaly Extremities:edema1+and L groin temp cath    Imaging: No results found.  Labs: BMET Recent Labs  Lab 01/08/18 0410 01/09/18 0408 01/10/18 0445 01/11/18 0426 01/12/18 0429 01/13/18 0433 01/14/18 0611  NA 134* 130* 128* 127* 128* 126*  127* 131*  K 3.7 3.9 4.1 4.5 4.9 5.1  5.2* 4.0  CL 98 96* 93* 92*  91* 89*  89* 91*  CO2 27 25 24  21* 19* 17*  18* 21*  GLUCOSE 87 100* 128* 120* 148* 119*  120* 144*  BUN 6* 15 27* 42* 51* 64*  64* 44*  CREATININE 1.37* 2.47* 3.72* 4.91* 5.99* 6.95*  6.91* 5.80*  CALCIUM 7.4* 7.5* 7.8* 7.8* 7.9* 8.0*  8.1* 7.9*  PHOS 2.4* 3.5 5.0* 6.6* 7.9* 9.2*  9.4* 6.5*   CBC Recent Labs  Lab 01/10/18 0445 01/11/18 0426 01/12/18 0429 01/13/18 0433 01/14/18 0611  WBC 5.2 5.5 5.7 6.0 6.3  NEUTROABS 3.9 3.9 4.3 4.5  --   HGB 7.0* 7.1* 7.4* 7.6* 7.6*  HCT 23.0* 23.6* 24.5* 24.9* 25.0*  MCV 99.1 100.4* 101.7* 100.8* 100.4*  PLT 51* 56* 66* 91* 111*    Medications:    . Chlorhexidine Gluconate Cloth  6 each Topical Daily  . collagenase   Topical BID  . feeding supplement (NEPRO CARB STEADY)  237 mL Oral BID BM  . insulin aspart  0-5 Units Subcutaneous QHS  . insulin aspart  0-9 Units Subcutaneous TID WC  . levothyroxine  100 mcg Oral QAC breakfast  . mouth rinse  15 mL Mouth Rinse BID  . QUEtiapine  50 mg Oral QHS  . sodium chloride flush  10-40 mL Intracatheter Q12H  . sodium chloride flush  10-40 mL Intracatheter Q12H      Otelia Santee, MD 01/14/2018, 12:55 PM

## 2018-01-14 NOTE — Progress Notes (Signed)
IV Team tried to placed an IV and was unsuccessful. MD notified

## 2018-01-14 NOTE — Consult Note (Signed)
[facility] Nurse wound consult note I spoke with primary RN, Nichola.  I clarified with her how to find the existing wound care orders.  No new needs identified for this patient at this time. Monitor the wound area(s) for worsening of condition such as: Signs/symptoms of infection,  Increase in size,  Development of or worsening of odor, Development of pain, or increased pain at the affected locations.  Notify the medical team if any of these develop.  Thank you for the consult.  Discussed plan of care with the patient and bedside nurse.  [facility] nurse will not follow at this time.  Please re-consult the [facility] team if needed.  Val Riles, RN, MSN, CWOCN, CNS-BC, pager [phone]

## 2018-01-14 NOTE — Progress Notes (Addendum)
Patient bladder scan was done, 600cc. MD verbal order an in and out  Cath,had difficulty there was blood clot and MD notified verbal order was given to Irrigate. Irrigation was done with 50cc of sterile water. Still no urine came out. MD notified. Will continue to monitor patient.   MD gave verbal order for US of the Abdomen for ascites.

## 2018-01-14 NOTE — Progress Notes (Signed)
Patient accidentally pulled out his central line. Covering Schor NP was notified. IV team RN placed a new PIV in his forearm.

## 2018-01-15 DIAGNOSIS — K7290 Hepatic failure, unspecified without coma: Secondary | ICD-10-CM

## 2018-01-15 LAB — RENAL FUNCTION PANEL
Albumin: 2.2 g/dL — ABNORMAL LOW (ref 3.5–5.0)
Anion gap: 22 — ABNORMAL HIGH (ref 5–15)
BUN: 57 mg/dL — AB (ref 8–23)
CALCIUM: 8.1 mg/dL — AB (ref 8.9–10.3)
CO2: 17 mmol/L — AB (ref 22–32)
Chloride: 93 mmol/L — ABNORMAL LOW (ref 98–111)
Creatinine, Ser: 6.73 mg/dL — ABNORMAL HIGH (ref 0.61–1.24)
GFR calc Af Amer: 9 mL/min — ABNORMAL LOW (ref >60)
GFR calc non Af Amer: 8 mL/min — ABNORMAL LOW (ref >60)
GLUCOSE: 95 mg/dL (ref 70–99)
PHOSPHORUS: 8.1 mg/dL — AB (ref 2.5–4.6)
Potassium: 4.5 mmol/L (ref 3.5–5.1)
Sodium: 132 mmol/L — ABNORMAL LOW (ref 135–145)

## 2018-01-15 LAB — CBC
HCT: 27.2 % — ABNORMAL LOW (ref 39.0–52.0)
Hemoglobin: 8.1 g/dL — ABNORMAL LOW (ref 13.0–17.0)
MCH: 30.8 pg (ref 26.0–34.0)
MCHC: 29.8 g/dL — ABNORMAL LOW (ref 30.0–36.0)
MCV: 103.4 fL — AB (ref 78.0–100.0)
PLATELETS: 116 10*3/uL — AB (ref 150–400)
RBC: 2.63 MIL/uL — AB (ref 4.22–5.81)
RDW: 26 % — AB (ref 11.5–15.5)
WBC: 6.1 10*3/uL (ref 4.0–10.5)

## 2018-01-15 LAB — GLUCOSE, CAPILLARY
GLUCOSE-CAPILLARY: 101 mg/dL — AB (ref 70–99)
Glucose-Capillary: 143 mg/dL — ABNORMAL HIGH (ref 70–99)
Glucose-Capillary: 164 mg/dL — ABNORMAL HIGH (ref 70–99)
Glucose-Capillary: 200 mg/dL — ABNORMAL HIGH (ref 70–99)

## 2018-01-15 LAB — MAGNESIUM: Magnesium: 2.7 mg/dL — ABNORMAL HIGH (ref 1.7–2.4)

## 2018-01-15 LAB — BASIC METABOLIC PANEL
Anion gap: 25 — ABNORMAL HIGH (ref 5–15)
BUN: 56 mg/dL — AB (ref 8–23)
CO2: 14 mmol/L — AB (ref 22–32)
Calcium: 8 mg/dL — ABNORMAL LOW (ref 8.9–10.3)
Chloride: 93 mmol/L — ABNORMAL LOW (ref 98–111)
Creatinine, Ser: 6.92 mg/dL — ABNORMAL HIGH (ref 0.61–1.24)
GFR calc Af Amer: 9 mL/min — ABNORMAL LOW (ref >60)
GFR, EST NON AFRICAN AMERICAN: 7 mL/min — AB (ref >60)
GLUCOSE: 93 mg/dL (ref 70–99)
Potassium: 4.6 mmol/L (ref 3.5–5.1)
Sodium: 132 mmol/L — ABNORMAL LOW (ref 135–145)

## 2018-01-15 NOTE — Progress Notes (Signed)
[facility] Progress Note    Assessment/ Plan:   66y/o man with osteo rt 1st toe s/p TMA, afib, CKD III, CHF EF 25-30%, CASHD here with septic shock ruosepsis, hyperkalemia and found to have cirrhosis as well CRRT initially but now off.  1 AKI sepsis, bactrim, arb, NSAIDS. Oliguric , no recovery. Will plan HD tomorrow if not better.-Dr. Deterding already discussed with son limit trial HD to 1 wk and then Hospiceif no signs of improvement. - Will check bladder scan as well -> didn't see a recent one recorded.  -  Last iHD Wednesday; will check tomorrow (Saturday)  for any signs of recovery. We will give it till mid week to determine if there's any signs of recovery. If no recovery then will withdrawal HD as he's not a candidate for long term outpatient hemodialysis.   - Family not bedside today and pt more lethargic.   2 Anemia 3 Deccub 4 severe debill 5 DM controlled 6 CM 7 CAD    Subjective:   More lethargic today but denies f/c/n/v/dyspnea   Objective:   BP (!) 103/57 (BP Location: Right Arm)   Pulse 69   Temp (!) 97.4 F (36.3 C) (Oral)   Resp 19   Ht 5\' 9"  (1.753 m)   Wt 98 kg (216 lb 0.8 oz)   SpO2 100%   BMI 31.91 kg/m   Intake/Output Summary (Last 24 hours) at 01/15/2018 1504 Last data filed at 01/15/2018 0902 Gross per 24 hour  Intake 260 ml  Output 0 ml  Net 260 ml   Weight change: -0.9 kg (-1 lb 15.8 oz)  Physical Exam: General appearance:no distress, pale andconfused Resp:diminished breath soundsbilaterallyand rhonchi bilaterally Cardio:S1, S2 normal and systolic murmur:systolic JJOACZYS0/6,[id number] 2nd left intercostal space DD:UKGU, non-tender; bowel sounds normal; no masses, no organomegaly Extremities:edema1+and L groin temp cath    Imaging: US Abdomen Limited  Result Date: 01/14/2018 CLINICAL DATA:  Ascites. EXAM: LIMITED ABDOMEN ULTRASOUND FOR ASCITES TECHNIQUE: Limited ultrasound survey for  ascites was performed in all four abdominal quadrants. COMPARISON:  None. FINDINGS: A small right pleural effusion is noted. Four-quadrant assessment for ascites demonstrates a moderate to large volume of ascites in all 4 quadrants spanning at least 8-10 cm in depth. IMPRESSION: Ascites is noted in all 4 quadrants of the abdomen imaged. Small right pleural effusion is also noted. Electronically Signed   By: Ashley Royalty M.D.   On: 01/14/2018 22:39    Labs: BMET Recent Labs  Lab 01/09/18 0408 01/10/18 0445 01/11/18 0426 01/12/18 0429 01/13/18 0433 01/14/18 0611 01/15/18 0447  NA 130* 128* 127* 128* 126*  127* 131* 132*  132*  K 3.9 4.1 4.5 4.9 5.1  5.2* 4.0 4.6  4.5  CL 96* 93* 92* 91* 89*  89* 91* 93*  93*  CO2 25 24 21* 19* 17*  18* 21* 14*  17*  GLUCOSE 100* 128* 120* 148* 119*  120* 144* 93  95  BUN 15 27* 42* 51* 64*  64* 44* 56*  57*  CREATININE 2.47* 3.72* 4.91* 5.99* 6.95*  6.91* 5.80* 6.92*  6.73*  CALCIUM 7.5* 7.8* 7.8* 7.9* 8.0*  8.1* 7.9* 8.0*  8.1*  PHOS 3.5 5.0* 6.6* 7.9* 9.2*  9.4* 6.5* 8.1*   CBC Recent Labs  Lab 01/10/18 0445 01/11/18 0426 01/12/18 0429 01/13/18 0433 01/14/18 0611 01/15/18 0447  WBC 5.2 5.5 5.7 6.0 6.3 6.1  NEUTROABS 3.9 3.9 4.3 4.5  --   --   HGB 7.0* 7.1*  7.4* 7.6* 7.6* 8.1*  HCT 23.0* 23.6* 24.5* 24.9* 25.0* 27.2*  MCV 99.1 100.4* 101.7* 100.8* 100.4* 103.4*  PLT 51* 56* 66* 91* 111* 116*    Medications:    . Chlorhexidine Gluconate Cloth  6 each Topical Daily  . collagenase   Topical BID  . feeding supplement (NEPRO CARB STEADY)  237 mL Oral BID BM  . insulin aspart  0-5 Units Subcutaneous QHS  . insulin aspart  0-9 Units Subcutaneous TID WC  . levothyroxine  100 mcg Oral QAC breakfast  . mouth rinse  15 mL Mouth Rinse BID  . QUEtiapine  50 mg Oral QHS  . sodium chloride flush  10-40 mL Intracatheter Q12H  . sodium chloride flush  10-40 mL Intracatheter Q12H      Otelia Santee, MD 01/15/2018, 3:04 PM

## 2018-01-15 NOTE — Progress Notes (Signed)
PROGRESS NOTE    Jose Heath  BRA:[id number] DOB: [birth year] DOA: 01/01/2018 PCP: Patient, No Pcp Per   Brief Narrative:  67 year old with a history of CAD status post CABG, systolic congestive heart failure ejection fraction 25-30% with AICD in place, diabetes mellitus type 2, osteomyelitis of the right first toe status post amputation, A. fib on Eliquis, CKD stage III came from [facility] with complaints of fatigue and no urine output.  He was found to be in septic shock secondary to urinary tract infection complicated by osteomyelitis and AKI on CKD.  He was also hyperkalemic.  Central line was placed, 2 L bolus was given, broad-spectrum antibiotics-vancomycin and Zosyn and pressors were started.  He was also started on CRRT with nephrology recommendation.  He was in biventricular heart failure requiring dobutamine drip.  He also required Precedex and Haldol given extreme agitation but was eventually transitioned to stepdown unit once she was stabilized and weaned off the drips.  Due to patient's chronic comorbidities and poor quality of life, palliative care service was consulted who recommended hospice care.  After prolonged discussion with nephrology team and palliative care service it was determined to try dialysis for about a week and then transition him to residential hospice.   Assessment & Plan:   Active Problems:   Septic shock (HCC)   Pressure injury of skin   Malnutrition of moderate degree   Acute respiratory distress with hypoxia, stable Acute systolic congestive heart failure with pulmonary edema, ejection fraction 25% -Appears Euvolemic.  Does not have any chest complaints at this time.  He is off inotropes.  Nebulizer treatments as needed.  Acute kidney injury on CKD stage III Intermittent acute metabolic encephalopathy, dementia/delirium with uremia - Intermittent dialysis per nephrology team.  Plan is to dialyze until next week and monitor for improvement.   If not any better will transition him to residential hospice.  Appreciate nephrology and palliative care team following. No urinary retention on bladder scan.  Its likely ascites is what we are seeing on bladder scan.  Agitation and delirium, intermittent -Currently he is on quetiapine 50 mg at bedtime.  Continue with this.  Continue sitter at bedside as patient is at risk of trying to get up get out of bed and falling and injuring himself.  History of liver cirrhosis Moderate to large volume ascites -Ammonia levels are normal LFTs are within normal limits. -Patient is not short of breath or having abdominal distention therefore hold off on paracentesis.  May choose to do palliative/therapeutic paracentesis if necessary.  Macrocytic anemia/thrombocytopenia - Closely monitor hemoglobin.  No clear signs of active bleeding.  If hemoglobin below 7 will transfuse him.  Monitor platelets as well.  Sacral decubitus ulcer - Wound care consult appreciated.  Treated with Gerhardt but cream.  Hypothyroidism -Continue Synthroid 100 mcg orally daily  Moderate protein calorie malnutrition -Encourage oral diet, feeding supplements- on Nepro  DVT prophylaxis: SCDs and Prevnar boots Code Status: Now DNR/DNI Family Communication: None at bedside Disposition Plan: Maintain inpatient stay  Consultants:   Nephrology  Palliative care  Procedures:   None  Antimicrobials:   Status post vancomycin and Zosyn treatment, currently off antibiotics   Subjective: No acute events overnight.  Bladder scan yesterday showed 600 cc but after flushing it showed some blood clots but no urine return.  Review of Systems Otherwise negative except as per HPI, including: General = no fevers, chills, dizziness, malaise, fatigue HEENT/EYES = negative for pain, redness, loss of vision,  double vision, blurred vision, loss of hearing, sore throat, hoarseness, dysphagia Cardiovascular= negative for chest pain,  palpitation, murmurs, lower extremity swelling Respiratory/lungs= negative for shortness of breath, cough, hemoptysis, wheezing, mucus production Gastrointestinal= negative for nausea, vomiting,, abdominal pain, melena, hematemesis Genitourinary= negative for Dysuria, Hematuria, Change in Urinary Frequency MSK = Negative for arthralgia, myalgias, Back Pain, Joint swelling  Neurology= Negative for headache, seizures, numbness, tingling  Psychiatry= Negative for anxiety, depression, suicidal and homocidal ideation Allergy/Immunology= Medication/Food allergy as listed  Skin= Negative for Rash, lesions, ulcers, itching    Objective: Vitals:   01/14/18 1709 01/14/18 2047 01/15/18 0600 01/15/18 0700  BP: 106/64 (!) 96/59 96/61   Pulse: 69 89 61   Resp: 20 20 16    Temp: 98.2 F (36.8 C) 98.3 F (36.8 C) (!) 97.5 F (36.4 C)   TempSrc: Oral Oral Oral   SpO2: 98% 98% 96%   Weight:    98 kg (216 lb 0.8 oz)  Height:        Intake/Output Summary (Last 24 hours) at 01/15/2018 1009 Last data filed at 01/15/2018 0902 Gross per 24 hour  Intake 440 ml  Output 0 ml  Net 440 ml   Filed Weights   01/13/18 1400 01/14/18 0500 01/15/18 0700  Weight: 98.9 kg (218 lb 0.6 oz) 98 kg (216 lb 0.8 oz) 98 kg (216 lb 0.8 oz)    Examination: Constitutional: NAD, calm, comfortable, chronically ill-appearing Eyes: PERRL, lids and conjunctivae normal ENMT: Mucous membranes are moist. Posterior pharynx clear of any exudate or lesions.Normal dentition.  Neck: normal, supple, no masses, no thyromegaly Respiratory: clear to auscultation bilaterally, no wheezing, no crackles. Normal respiratory effort. No accessory muscle use.  Cardiovascular: Regular rate and rhythm, no murmurs / rubs / gallops.  1+ bilateral lower extremity pitting edema 2+ pedal pulses. No carotid bruits.  Abdomen: Positive fluid in the abdomen.  No tenderness, no masses palpated. No hepatosplenomegaly. Bowel sounds positive.    Musculoskeletal: no clubbing / cyanosis. No joint deformity upper and lower extremities. Good ROM, no contractures. Normal muscle tone.  Skin: no rashes, lesions, ulcers. No induration Neurologic: CN 2-12 grossly intact. Sensation intact, DTR normal. Strength 5/5 in all 4.  Psychiatric: somwwhat poor judgment and insight. Alert and oriented x 2. Normal mood.    Data Reviewed:   CBC: Recent Labs  Lab 01/10/18 0445 01/11/18 0426 01/12/18 0429 01/13/18 0433 01/14/18 0611 01/15/18 0447  WBC 5.2 5.5 5.7 6.0 6.3 6.1  NEUTROABS 3.9 3.9 4.3 4.5  --   --   HGB 7.0* 7.1* 7.4* 7.6* 7.6* 8.1*  HCT 23.0* 23.6* 24.5* 24.9* 25.0* 27.2*  MCV 99.1 100.4* 101.7* 100.8* 100.4* 103.4*  PLT 51* 56* 66* 91* 111* 762*   Basic Metabolic Panel: Recent Labs  Lab 01/11/18 0426 01/12/18 0429 01/13/18 0433 01/14/18 0611 01/15/18 0447  NA 127* 128* 126*  127* 131* 132*  132*  K 4.5 4.9 5.1  5.2* 4.0 4.6  4.5  CL 92* 91* 89*  89* 91* 93*  93*  CO2 21* 19* 17*  18* 21* 14*  17*  GLUCOSE 120* 148* 119*  120* 144* 93  95  BUN 42* 51* 64*  64* 44* 56*  57*  CREATININE 4.91* 5.99* 6.95*  6.91* 5.80* 6.92*  6.73*  CALCIUM 7.8* 7.9* 8.0*  8.1* 7.9* 8.0*  8.1*  MG 2.4 2.8* 2.9* 2.5* 2.7*  PHOS 6.6* 7.9* 9.2*  9.4* 6.5* 8.1*   GFR: Estimated Creatinine Clearance: 12.5 mL/min (A) (by  C-G formula based on SCr of 6.73 mg/dL (H)). Liver Function Tests: Recent Labs  Lab 01/11/18 0426 01/12/18 0429 01/13/18 0433 01/14/18 0611 01/15/18 0447  AST  --  17 16  --   --   ALT  --  21 20  --   --   ALKPHOS  --  62 57  --   --   BILITOT  --  1.6* 1.6*  --   --   PROT  --  6.3* 6.0*  --   --   ALBUMIN 2.3* 2.2* 2.2*  2.2* 2.2* 2.2*   No results for input(s): LIPASE, AMYLASE in the last 168 hours. Recent Labs  Lab 01/09/18 1147  AMMONIA 43*   Coagulation Profile: No results for input(s): INR, PROTIME in the last 168 hours. Cardiac Enzymes: No results for input(s): CKTOTAL, CKMB,  CKMBINDEX, TROPONINI in the last 168 hours. BNP (last 3 results) No results for input(s): PROBNP in the last 8760 hours. HbA1C: No results for input(s): HGBA1C in the last 72 hours. CBG: Recent Labs  Lab 01/14/18 0757 01/14/18 1152 01/14/18 1701 01/14/18 2110 01/15/18 0808  GLUCAP 146* 141* 171* 87 101*   Lipid Profile: No results for input(s): CHOL, HDL, LDLCALC, TRIG, CHOLHDL, LDLDIRECT in the last 72 hours. Thyroid Function Tests: No results for input(s): TSH, T4TOTAL, FREET4, T3FREE, THYROIDAB in the last 72 hours. Anemia Panel: No results for input(s): VITAMINB12, FOLATE, FERRITIN, TIBC, IRON, RETICCTPCT in the last 72 hours. Sepsis Labs: No results for input(s): PROCALCITON, LATICACIDVEN in the last 168 hours.  No results found for this or any previous visit (from the past 240 hour(s)).       Radiology Studies: US Abdomen Limited  Result Date: 01/14/2018 CLINICAL DATA:  Ascites. EXAM: LIMITED ABDOMEN ULTRASOUND FOR ASCITES TECHNIQUE: Limited ultrasound survey for ascites was performed in all four abdominal quadrants. COMPARISON:  None. FINDINGS: A small right pleural effusion is noted. Four-quadrant assessment for ascites demonstrates a moderate to large volume of ascites in all 4 quadrants spanning at least 8-10 cm in depth. IMPRESSION: Ascites is noted in all 4 quadrants of the abdomen imaged. Small right pleural effusion is also noted. Electronically Signed   By: Ashley Royalty M.D.   On: 01/14/2018 22:39        Scheduled Meds: . Chlorhexidine Gluconate Cloth  6 each Topical Daily  . collagenase   Topical BID  . feeding supplement (NEPRO CARB STEADY)  237 mL Oral BID BM  . insulin aspart  0-5 Units Subcutaneous QHS  . insulin aspart  0-9 Units Subcutaneous TID WC  . levothyroxine  100 mcg Oral QAC breakfast  . mouth rinse  15 mL Mouth Rinse BID  . QUEtiapine  50 mg Oral QHS  . sodium chloride flush  10-40 mL Intracatheter Q12H  . sodium chloride flush  10-40 mL  Intracatheter Q12H   Continuous Infusions: . sodium chloride 250 mL (01/11/18 2212)     LOS: 14 days    I have spent 25 minutes face to face with the patient and on the ward discussing the patients care, assessment, plan and disposition with other care givers. >50% of the time was devoted counseling the patient about the risks and benefits of treatment and coordinating care.     Herb Beltre Arsenio Loader, MD [facility] Pager [phone]   If 7PM-7AM, please contact night-coverage [email or web address] Password [email or web address] 01/15/2018, 10:09 AM

## 2018-01-16 LAB — GLUCOSE, CAPILLARY
GLUCOSE-CAPILLARY: 140 mg/dL — AB (ref 70–99)
GLUCOSE-CAPILLARY: 190 mg/dL — AB (ref 70–99)
Glucose-Capillary: 174 mg/dL — ABNORMAL HIGH (ref 70–99)
Glucose-Capillary: 180 mg/dL — ABNORMAL HIGH (ref 70–99)

## 2018-01-16 LAB — BASIC METABOLIC PANEL
ANION GAP: 18 — AB (ref 5–15)
BUN: 67 mg/dL — ABNORMAL HIGH (ref 8–23)
CALCIUM: 8 mg/dL — AB (ref 8.9–10.3)
CO2: 20 mmol/L — ABNORMAL LOW (ref 22–32)
Chloride: 91 mmol/L — ABNORMAL LOW (ref 98–111)
Creatinine, Ser: 7.83 mg/dL — ABNORMAL HIGH (ref 0.61–1.24)
GFR, EST AFRICAN AMERICAN: 7 mL/min — AB (ref >60)
GFR, EST NON AFRICAN AMERICAN: 6 mL/min — AB (ref >60)
Glucose, Bld: 181 mg/dL — ABNORMAL HIGH (ref 70–99)
Potassium: 4.5 mmol/L (ref 3.5–5.1)
Sodium: 129 mmol/L — ABNORMAL LOW (ref 135–145)

## 2018-01-16 LAB — CBC
HCT: 26 % — ABNORMAL LOW (ref 39.0–52.0)
Hemoglobin: 7.9 g/dL — ABNORMAL LOW (ref 13.0–17.0)
MCH: 30.7 pg (ref 26.0–34.0)
MCHC: 30.4 g/dL (ref 30.0–36.0)
MCV: 101.2 fL — AB (ref 78.0–100.0)
PLATELETS: 132 10*3/uL — AB (ref 150–400)
RBC: 2.57 MIL/uL — ABNORMAL LOW (ref 4.22–5.81)
RDW: 25.6 % — AB (ref 11.5–15.5)
WBC: 6.7 10*3/uL (ref 4.0–10.5)

## 2018-01-16 LAB — RENAL FUNCTION PANEL
ALBUMIN: 2.1 g/dL — AB (ref 3.5–5.0)
Anion gap: 19 — ABNORMAL HIGH (ref 5–15)
BUN: 68 mg/dL — AB (ref 8–23)
CALCIUM: 8 mg/dL — AB (ref 8.9–10.3)
CO2: 19 mmol/L — AB (ref 22–32)
CREATININE: 7.79 mg/dL — AB (ref 0.61–1.24)
Chloride: 91 mmol/L — ABNORMAL LOW (ref 98–111)
GFR calc Af Amer: 7 mL/min — ABNORMAL LOW (ref >60)
GFR calc non Af Amer: 6 mL/min — ABNORMAL LOW (ref >60)
GLUCOSE: 179 mg/dL — AB (ref 70–99)
PHOSPHORUS: 8.4 mg/dL — AB (ref 2.5–4.6)
Potassium: 4.5 mmol/L (ref 3.5–5.1)
SODIUM: 129 mmol/L — AB (ref 135–145)

## 2018-01-16 LAB — MAGNESIUM: MAGNESIUM: 2.9 mg/dL — AB (ref 1.7–2.4)

## 2018-01-16 NOTE — Progress Notes (Signed)
HD tx initiated via HD cath w/ cath function issues, AP: pull/push/flush well, VP: very sluggish pull, push/flush well, VSS w/ soft bp, will cont to monitor while on HD tx

## 2018-01-16 NOTE — Progress Notes (Signed)
[facility] Progress Note    Assessment/ Plan:   66y/o man with osteo rt 1st toe s/p TMA, afib, CKD III, CHF EF 25-30%, CASHD here with septic shock ruosepsis, hyperkalemia and found to have cirrhosis as well CRRT initially but now off.  1 AKI sepsis, bactrim, arb, NSAIDS. Oliguric , no recovery.  - HD today (Last HD Wed)  - next consideration for HD ~Tues or Wed and then if no improvement I would not offer HD anymore. Dr. Jimmy Footman and I have already discussed with son limit trial HD to 1 wk and then Hospiceif no signs of improvement. 1 week mark would be mid week.  - One of the  bladder scans seems signif but not documented as a PVR - Will check a PVR today and if greater than 250 then foley.  2 Anemia 3 Deccub 4 severe debill 5 DM controlled 6 CM 7 CAD    Subjective:   Denies f/c/n/v/dyspnea; c/o thirst   Objective:   BP 102/66 (BP Location: Left Arm)   Pulse 69   Temp (!) 97.5 F (36.4 C) (Oral)   Resp 18   Ht 5\' 9"  (1.753 m)   Wt 98 kg (216 lb 0.8 oz)   SpO2 100%   BMI 31.91 kg/m   Intake/Output Summary (Last 24 hours) at 01/16/2018 0646 Last data filed at 01/16/2018 0234 Gross per 24 hour  Intake 300 ml  Output 0 ml  Net 300 ml   Weight change:   Physical Exam: General appearance:no distress, pale andconfused Resp:diminished breath soundsbilaterallyand rhonchi bilaterally Cardio:S1, S2 normal and systolic murmur:systolic [id number] 2nd left intercostal space NK:NLZJ, non-tender; bowel sounds normal; no masses, no organomegaly Extremities:edematrand L groin temp cath GU: no foley    Imaging: US Abdomen Limited  Result Date: 01/14/2018 CLINICAL DATA:  Ascites. EXAM: LIMITED ABDOMEN ULTRASOUND FOR ASCITES TECHNIQUE: Limited ultrasound survey for ascites was performed in all four abdominal quadrants. COMPARISON:  None. FINDINGS: A small right pleural effusion is noted. Four-quadrant assessment for ascites  demonstrates a moderate to large volume of ascites in all 4 quadrants spanning at least 8-10 cm in depth. IMPRESSION: Ascites is noted in all 4 quadrants of the abdomen imaged. Small right pleural effusion is also noted. Electronically Signed   By: Ashley Royalty M.D.   On: 01/14/2018 22:39    Labs: BMET Recent Labs  Lab 01/10/18 0445 01/11/18 0426 01/12/18 0429 01/13/18 0433 01/14/18 0611 01/15/18 0447 01/16/18 0347  NA 128* 127* 128* 126*  127* 131* 132*  132* 129*  129*  K 4.1 4.5 4.9 5.1  5.2* 4.0 4.6  4.5 4.5  4.5  CL 93* 92* 91* 89*  89* 91* 93*  93* 91*  91*  CO2 24 21* 19* 17*  18* 21* 14*  17* 20*  19*  GLUCOSE 128* 120* 148* 119*  120* 144* 93  95 181*  179*  BUN 27* 42* 51* 64*  64* 44* 56*  57* 67*  68*  CREATININE 3.72* 4.91* 5.99* 6.95*  6.91* 5.80* 6.92*  6.73* 7.83*  7.79*  CALCIUM 7.8* 7.8* 7.9* 8.0*  8.1* 7.9* 8.0*  8.1* 8.0*  8.0*  PHOS 5.0* 6.6* 7.9* 9.2*  9.4* 6.5* 8.1* 8.4*   CBC Recent Labs  Lab 01/10/18 0445 01/11/18 0426 01/12/18 0429 01/13/18 0433 01/14/18 0611 01/15/18 0447 01/16/18 0347  WBC 5.2 5.5 5.7 6.0 6.3 6.1 6.7  NEUTROABS 3.9 3.9 4.3 4.5  --   --   --  HGB 7.0* 7.1* 7.4* 7.6* 7.6* 8.1* 7.9*  HCT 23.0* 23.6* 24.5* 24.9* 25.0* 27.2* 26.0*  MCV 99.1 100.4* 101.7* 100.8* 100.4* 103.4* 101.2*  PLT 51* 56* 66* 91* 111* 116* 132*    Medications:    . Chlorhexidine Gluconate Cloth  6 each Topical Daily  . collagenase   Topical BID  . feeding supplement (NEPRO CARB STEADY)  237 mL Oral BID BM  . insulin aspart  0-5 Units Subcutaneous QHS  . insulin aspart  0-9 Units Subcutaneous TID WC  . levothyroxine  100 mcg Oral QAC breakfast  . mouth rinse  15 mL Mouth Rinse BID  . QUEtiapine  50 mg Oral QHS  . sodium chloride flush  10-40 mL Intracatheter Q12H  . sodium chloride flush  10-40 mL Intracatheter Q12H      Otelia Santee, MD 01/16/2018, 6:46 AM

## 2018-01-16 NOTE — Progress Notes (Signed)
PROGRESS NOTE    Jose Heath  YQI:[id number] DOB: [birth year] DOA: 01/01/2018 PCP: Patient, No Pcp Per   Brief Narrative:  67 year old with a history of CAD status post CABG, systolic congestive heart failure ejection fraction 25-30% with AICD in place, diabetes mellitus type 2, osteomyelitis of the right first toe status post amputation, A. fib on Eliquis, CKD stage III came from [facility] with complaints of fatigue and no urine output.  He was found to be in septic shock secondary to urinary tract infection complicated by osteomyelitis and AKI on CKD.  He was also hyperkalemic.  Central line was placed, 2 L bolus was given, broad-spectrum antibiotics-vancomycin and Zosyn and pressors were started.  He was also started on CRRT with nephrology recommendation.  He was in biventricular heart failure requiring dobutamine drip.  He also required Precedex and Haldol given extreme agitation but was eventually transitioned to stepdown unit once she was stabilized and weaned off the drips.  Due to patient's chronic comorbidities and poor quality of life, palliative care service was consulted who recommended hospice care.  After prolonged discussion with nephrology team and palliative care service it was determined to try dialysis for about a week and then transition him to residential hospice.   Assessment & Plan:   Active Problems:   Septic shock (HCC)   Pressure injury of skin   Malnutrition of moderate degree    AKI superimposed on CKD stage III--secondary to sepsis, bactrim, arb, NSAIDS. Oliguric , no renal recovery so far even with hemodialysis, last HD session on 01/16/2018, according to nephrology team plan is for additional hemodialysis session on  01/19/18 and  01/20/2018, if no renal recovery after that time nephrology team plan to discuss stopping hemodialysis and hospice involvement with family members , plan is to also check postvoid residual and if greater than 250 place a  Foley catheter for urinary retention. Patient continues to have episodes of delirium and encephalopathy superimposed on baseline dementia  Acute respiratory distress with hypoxia, stable Acute systolic congestive heart failure with pulmonary edema, ejection fraction 25% -Appears Euvolemic.  Does not have any chest complaints at this time.  He is off inotropes.  Nebulizer treatments as needed.   Agitation and delirium, intermittent/Patient continues to have episodes of delirium and encephalopathy superimposed on baseline dementia--continue  quetiapine 50 mg at bedtime.    Continue sitter at bedside as patient is at risk of trying to get up get out of bed and falling and injuring himself.  History of liver cirrhosis/Moderate to large volume ascites -Ammonia levels are normal LFTs are within normal limits. -Patient is not short of breath or having abdominal distention therefore hold off on paracentesis.  May choose to do palliative/therapeutic paracentesis if necessary.  Macrocytic anemia/thrombocytopenia - Closely monitor hemoglobin.  No clear signs of active bleeding.  If hemoglobin below 7 will transfuse him.  Monitor platelets as well.  Sacral decubitus ulcer - Wound care consult appreciated.  Treated with Gerhardt but cream.  Hypothyroidism -Continue Synthroid 100 mcg orally daily  Moderate protein calorie malnutrition -Encourage oral diet, feeding supplements- on Nepro  DVT prophylaxis: SCDs and Prevnar boots Code Status: Now DNR/DNI Family Communication: None at bedside Disposition Plan: Maintain inpatient stay, may need inpatient hospice if no renal recovery by next week  Consultants:   Nephrology  Palliative care  Antimicrobials:   Status post vancomycin and Zosyn treatment, currently off antibiotics   Subjective: One-to-one sitter at bedside, patient resting comfortably, no fevers, concerns about  urinary retention persist    Objective: Vitals:   01/16/18 0409  01/16/18 0812 01/16/18 1143 01/16/18 1626  BP: 102/66 (!) 109/58 97/61 103/63  Pulse: 69 60 70 69  Resp: 18 17 18 19   Temp: (!) 97.5 F (36.4 C) 98.2 F (36.8 C) 98 F (36.7 C) 97.8 F (36.6 C)  TempSrc: Oral Oral Oral Oral  SpO2: 100% 92% 97% 96%  Weight:      Height:        Intake/Output Summary (Last 24 hours) at 01/16/2018 2017 Last data filed at 01/16/2018 1700 Gross per 24 hour  Intake 380 ml  Output 0 ml  Net 380 ml   Filed Weights   01/13/18 1400 01/14/18 0500 01/15/18 0700  Weight: 98.9 kg (218 lb 0.6 oz) 98 kg (216 lb 0.8 oz) 98 kg (216 lb 0.8 oz)    Examination: Constitutional: NAD, calm, comfortable, chronically ill-appearing Eyes: PERRL, lids and conjunctivae normal ENMT: Mucous membranes are moist. Posterior pharynx clear of any exudate or lesions.Normal dentition.  Neck: normal, supple, no masses, no thyromegaly Respiratory: clear to auscultation bilaterally, no wheezing, no crackles. Normal respiratory effort. No accessory muscle use.  Cardiovascular: Regular rate and rhythm, no murmurs / rubs / gallops.  1+ bilateral lower extremity pitting edema 2+ pedal pulses. No carotid bruits.  Abdomen: Positive fluid in the abdomen.  No tenderness, no masses palpated.. Bowel sounds positive.  Neurologic:   Weakness without new focal deficits Psychiatric:  cognitive deficits noted,Patient continues to have episodes of delirium and encephalopathy superimposed on baseline dementia  Data Reviewed:   CBC: Recent Labs  Lab 01/10/18 0445 01/11/18 0426 01/12/18 0429 01/13/18 0433 01/14/18 0611 01/15/18 0447 01/16/18 0347  WBC 5.2 5.5 5.7 6.0 6.3 6.1 6.7  NEUTROABS 3.9 3.9 4.3 4.5  --   --   --   HGB 7.0* 7.1* 7.4* 7.6* 7.6* 8.1* 7.9*  HCT 23.0* 23.6* 24.5* 24.9* 25.0* 27.2* 26.0*  MCV 99.1 100.4* 101.7* 100.8* 100.4* 103.4* 101.2*  PLT 51* 56* 66* 91* 111* 116* 774*   Basic Metabolic Panel: Recent Labs  Lab 01/12/18 0429 01/13/18 0433 01/14/18 0611  01/15/18 0447 01/16/18 0347  NA 128* 126*  127* 131* 132*  132* 129*  129*  K 4.9 5.1  5.2* 4.0 4.6  4.5 4.5  4.5  CL 91* 89*  89* 91* 93*  93* 91*  91*  CO2 19* 17*  18* 21* 14*  17* 20*  19*  GLUCOSE 148* 119*  120* 144* 93  95 181*  179*  BUN 51* 64*  64* 44* 56*  57* 67*  68*  CREATININE 5.99* 6.95*  6.91* 5.80* 6.92*  6.73* 7.83*  7.79*  CALCIUM 7.9* 8.0*  8.1* 7.9* 8.0*  8.1* 8.0*  8.0*  MG 2.8* 2.9* 2.5* 2.7* 2.9*  PHOS 7.9* 9.2*  9.4* 6.5* 8.1* 8.4*   GFR: Estimated Creatinine Clearance: 10.8 mL/min (A) (by C-G formula based on SCr of 7.79 mg/dL (H)). Liver Function Tests: Recent Labs  Lab 01/12/18 0429 01/13/18 0433 01/14/18 0611 01/15/18 0447 01/16/18 0347  AST 17 16  --   --   --   ALT 21 20  --   --   --   ALKPHOS 62 57  --   --   --   BILITOT 1.6* 1.6*  --   --   --   PROT 6.3* 6.0*  --   --   --   ALBUMIN 2.2* 2.2*  2.2* 2.2* 2.2*  2.1*   No results for input(s): LIPASE, AMYLASE in the last 168 hours. No results for input(s): AMMONIA in the last 168 hours. Coagulation Profile: No results for input(s): INR, PROTIME in the last 168 hours. Cardiac Enzymes: No results for input(s): CKTOTAL, CKMB, CKMBINDEX, TROPONINI in the last 168 hours. BNP (last 3 results) No results for input(s): PROBNP in the last 8760 hours. HbA1C: No results for input(s): HGBA1C in the last 72 hours. CBG: Recent Labs  Lab 01/15/18 1735 01/15/18 2125 01/16/18 0802 01/16/18 1200 01/16/18 1705  GLUCAP 164* 200* 174* 180* 190*   Lipid Profile: No results for input(s): CHOL, HDL, LDLCALC, TRIG, CHOLHDL, LDLDIRECT in the last 72 hours. Thyroid Function Tests: No results for input(s): TSH, T4TOTAL, FREET4, T3FREE, THYROIDAB in the last 72 hours. Anemia Panel: No results for input(s): VITAMINB12, FOLATE, FERRITIN, TIBC, IRON, RETICCTPCT in the last 72 hours. Sepsis Labs: No results for input(s): PROCALCITON, LATICACIDVEN in the last 168 hours.  No results  found for this or any previous visit (from the past 240 hour(s)).   Radiology Studies: US Abdomen Limited  Result Date: 01/14/2018 CLINICAL DATA:  Ascites. EXAM: LIMITED ABDOMEN ULTRASOUND FOR ASCITES TECHNIQUE: Limited ultrasound survey for ascites was performed in all four abdominal quadrants. COMPARISON:  None. FINDINGS: A small right pleural effusion is noted. Four-quadrant assessment for ascites demonstrates a moderate to large volume of ascites in all 4 quadrants spanning at least 8-10 cm in depth. IMPRESSION: Ascites is noted in all 4 quadrants of the abdomen imaged. Small right pleural effusion is also noted. Electronically Signed   By: Ashley Royalty M.D.   On: 01/14/2018 22:39    Scheduled Meds: . Chlorhexidine Gluconate Cloth  6 each Topical Daily  . collagenase   Topical BID  . feeding supplement (NEPRO CARB STEADY)  237 mL Oral BID BM  . insulin aspart  0-5 Units Subcutaneous QHS  . insulin aspart  0-9 Units Subcutaneous TID WC  . levothyroxine  100 mcg Oral QAC breakfast  . mouth rinse  15 mL Mouth Rinse BID  . QUEtiapine  50 mg Oral QHS  . sodium chloride flush  10-40 mL Intracatheter Q12H  . sodium chloride flush  10-40 mL Intracatheter Q12H   Continuous Infusions: . sodium chloride 250 mL (01/11/18 2212)     LOS: 15 days    Roxan Hockey, MD [facility] Pager [phone]   If 7PM-7AM, please contact night-coverage [email or web address] Password [facility] 01/16/2018, 8:17 PM

## 2018-01-17 DIAGNOSIS — N183 Chronic kidney disease, stage 3 unspecified: Secondary | ICD-10-CM

## 2018-01-17 DIAGNOSIS — N179 Acute kidney failure, unspecified: Secondary | ICD-10-CM

## 2018-01-17 LAB — CBC
HEMATOCRIT: 25.5 % — AB (ref 39.0–52.0)
HEMOGLOBIN: 8 g/dL — AB (ref 13.0–17.0)
MCH: 31.6 pg (ref 26.0–34.0)
MCHC: 31.4 g/dL (ref 30.0–36.0)
MCV: 100.8 fL — AB (ref 78.0–100.0)
Platelets: 132 10*3/uL — ABNORMAL LOW (ref 150–400)
RBC: 2.53 MIL/uL — ABNORMAL LOW (ref 4.22–5.81)
RDW: 25.6 % — ABNORMAL HIGH (ref 11.5–15.5)
WBC: 6.3 10*3/uL (ref 4.0–10.5)

## 2018-01-17 LAB — RENAL FUNCTION PANEL
ALBUMIN: 2.2 g/dL — AB (ref 3.5–5.0)
Anion gap: 14 (ref 5–15)
BUN: 45 mg/dL — ABNORMAL HIGH (ref 8–23)
CHLORIDE: 98 mmol/L (ref 98–111)
CO2: 22 mmol/L (ref 22–32)
Calcium: 7.9 mg/dL — ABNORMAL LOW (ref 8.9–10.3)
Creatinine, Ser: 5.59 mg/dL — ABNORMAL HIGH (ref 0.61–1.24)
GFR calc Af Amer: 11 mL/min — ABNORMAL LOW (ref >60)
GFR, EST NON AFRICAN AMERICAN: 10 mL/min — AB (ref >60)
Glucose, Bld: 173 mg/dL — ABNORMAL HIGH (ref 70–99)
PHOSPHORUS: 6.2 mg/dL — AB (ref 2.5–4.6)
POTASSIUM: 3.9 mmol/L (ref 3.5–5.1)
Sodium: 134 mmol/L — ABNORMAL LOW (ref 135–145)

## 2018-01-17 LAB — BASIC METABOLIC PANEL
Anion gap: 15 (ref 5–15)
BUN: 46 mg/dL — ABNORMAL HIGH (ref 8–23)
CHLORIDE: 97 mmol/L — AB (ref 98–111)
CO2: 21 mmol/L — ABNORMAL LOW (ref 22–32)
CREATININE: 5.64 mg/dL — AB (ref 0.61–1.24)
Calcium: 7.9 mg/dL — ABNORMAL LOW (ref 8.9–10.3)
GFR calc non Af Amer: 9 mL/min — ABNORMAL LOW (ref >60)
GFR, EST AFRICAN AMERICAN: 11 mL/min — AB (ref >60)
Glucose, Bld: 172 mg/dL — ABNORMAL HIGH (ref 70–99)
POTASSIUM: 3.8 mmol/L (ref 3.5–5.1)
SODIUM: 133 mmol/L — AB (ref 135–145)

## 2018-01-17 LAB — GLUCOSE, CAPILLARY
GLUCOSE-CAPILLARY: 182 mg/dL — AB (ref 70–99)
GLUCOSE-CAPILLARY: 121 mg/dL — AB (ref 70–99)
Glucose-Capillary: 167 mg/dL — ABNORMAL HIGH (ref 70–99)
Glucose-Capillary: 157 mg/dL — ABNORMAL HIGH (ref 70–99)

## 2018-01-17 LAB — MAGNESIUM: Magnesium: 2.6 mg/dL — ABNORMAL HIGH (ref 1.7–2.4)

## 2018-01-17 MED ORDER — ALTEPLASE 2 MG IJ SOLR
2.0000 mg | Freq: Once | INTRAMUSCULAR | Status: AC
  Administered 2018-01-17: 2 mg

## 2018-01-17 NOTE — Progress Notes (Signed)
HD tx completed @ 2307 w/o problem, UF goal met, blood rinsed back, VSS, report called to Nena Polio, RN

## 2018-01-17 NOTE — Progress Notes (Signed)
Patient unable to void stating he doesn't feel the urge. Bladder scan performed demonstrating 441 mLs. Urinary catheter placed without difficulty. 12 mLs pink, cloudy urine returned. MD notified. Orders to discontinue urinary catheter. Will continue to monitor. Bartholomew Crews, RN

## 2018-01-17 NOTE — Progress Notes (Signed)
PROGRESS NOTE    Jose Heath  YOV:[id number] DOB: [birth year] DOA: 01/01/2018 PCP: Patient, No Pcp Per     Brief Narrative:  Jose Heath is a 67 year old with a history of CAD status post CABG, systolic congestive heart failure ejection fraction 25-30% with AICD in place, diabetes mellitus type 2, osteomyelitis of the right first toe status post amputation, A. fib on Eliquis, CKD stage III came from [facility] with complaints of fatigue and no urine output.  He was found to be in septic shock secondary to urinary tract infection complicated by osteomyelitis and AKI on CKD.  He was also hyperkalemic.  Central line was placed, 2 L bolus was given, broad-spectrum antibiotics-vancomycin and Zosyn and pressors were started.  He was also started on CRRT with nephrology recommendation.  He was in biventricular heart failure requiring dobutamine drip.  He also required Precedex and Haldol given extreme agitation but was eventually transitioned to stepdown unit once he was stabilized and weaned off the drips.  Due to patient's chronic comorbidities and poor quality of life, palliative care service was consulted who recommended hospice care.  After prolonged discussion with nephrology team and palliative care service it was determined to try dialysis for about a week and then transition him to residential hospice if without significant improvement.   New events last 24 hours / Subjective: No acute events, patient without complaints this morning.   Assessment & Plan:   Principal Problem:   AKI (acute kidney injury) ([place]) Active Problems:   Diabetes mellitus ([place])   Septic shock ([place])   Pressure injury of skin   Malnutrition of moderate degree   CKD (chronic kidney disease), stage III ([place])   AKI superimposed on CKD stage III -Secondary to sepsis, bactrim, ARB, NSAIDS. Oliguric, no renal recovery so far even with hemodialysis, last HD session on 01/16/2018. If no renal recovery by next  week, would recommend transition to hospice   Acute respiratory distress with hypoxia Acute systolic congestive heart failure with pulmonary edema, ejection fraction 25% -Stable today, remains on [place] O2   Agitation and delirium superimposed on baseline dementia -Continue Seroquel 50 mg at bedtime -Tele sitter for safety   History of liver cirrhosis/Moderate to large volume ascites -Stable  Macrocytic anemia/thrombocytopenia -Stable  Sacral decubitus ulcer -Wound care consult appreciated  Hypothyroidism -Continue Synthroid   DM 2 -SSI   Moderate protein calorie malnutrition -Encourage oral diet, feeding supplements- on Nepro   DVT prophylaxis: Subq hep  Code Status: DNR Family Communication: No family at bedside Disposition Plan: Pending observation, likely transition to hospice mid-week if without improvement   Consultants:   Nephrology  Palliative care   Antimicrobials:  Anti-infectives (From admission, onward)   Start     Dose/Rate Route Frequency Ordered Stop   01/11/18 2200  piperacillin-tazobactam (ZOSYN) IVPB 3.375 g     3.375 g 12.5 mL/hr over 240 Minutes Intravenous Every 12 hours 01/11/18 1414 01/12/18 1200   01/08/18 1200  piperacillin-tazobactam (ZOSYN) IVPB 2.25 g  Status:  Discontinued     2.25 g 100 mL/hr over 30 Minutes Intravenous Every 6 hours 01/08/18 0856 01/11/18 1414   01/06/18 2000  vancomycin (VANCOCIN) IVPB 750 mg/150 ml premix  Status:  Discontinued     750 mg 150 mL/hr over 60 Minutes Intravenous Every 24 hours 01/06/18 1531 01/08/18 0856   01/02/18 1400  vancomycin (VANCOCIN) IVPB 1000 mg/200 mL premix  Status:  Discontinued     1,000 mg 200 mL/hr over 60 Minutes  Intravenous Every 24 hours 01/01/18 2354 01/06/18 1531   01/02/18 0200  piperacillin-tazobactam (ZOSYN) IVPB 3.375 g  Status:  Discontinued     3.375 g 12.5 mL/hr over 240 Minutes Intravenous Every 12 hours 01/01/18 2049 01/01/18 2350   01/02/18 0200   piperacillin-tazobactam (ZOSYN) IVPB 3.375 g  Status:  Discontinued     3.375 g 100 mL/hr over 30 Minutes Intravenous Every 6 hours 01/01/18 2351 01/08/18 0856        Objective: Vitals:   01/16/18 2315 01/16/18 2352 01/17/18 0526 01/17/18 0748  BP: 113/60 (!) 105/56 (!) 100/58 108/65  Pulse: 69 (!) 59 (!) 59 70  Resp: (!) 22 17 20 17   Temp: 98.7 F (37.1 C) 98.5 F (36.9 C) 98.2 F (36.8 C) (!) 97.5 F (36.4 C)  TempSrc: Oral   Oral  SpO2: 100% 99% 96% 100%  Weight: 87.1 kg (192 lb 0.3 oz)  87.1 kg (192 lb 0.3 oz)   Height:        Intake/Output Summary (Last 24 hours) at 01/17/2018 1249 Last data filed at 01/17/2018 0847 Gross per 24 hour  Intake 440 ml  Output 15 ml  Net 425 ml   Filed Weights   01/16/18 1909 01/16/18 2315 01/17/18 0526  Weight: 87.1 kg (192 lb 0.3 oz) 87.1 kg (192 lb 0.3 oz) 87.1 kg (192 lb 0.3 oz)    Examination:  General exam: Appears calm and comfortable  Respiratory system: Clear to auscultation. Respiratory effort normal. Cardiovascular system: S1 & S2 heard, RRR. No JVD, murmurs, rubs, gallops or clicks. No pedal edema. Gastrointestinal system: Abdomen is nondistended, soft and nontender. No organomegaly or masses felt. Normal bowel sounds heard. Central nervous system: Alert. No focal neurological deficits. Extremities: Symmetric 5 x 5 power. Psychiatry: +Dementia   Data Reviewed: I have personally reviewed following labs and imaging studies  CBC: Recent Labs  Lab 01/11/18 0426 01/12/18 0429 01/13/18 0433 01/14/18 0611 01/15/18 0447 01/16/18 0347 01/17/18 0506  WBC 5.5 5.7 6.0 6.3 6.1 6.7 6.3  NEUTROABS 3.9 4.3 4.5  --   --   --   --   HGB 7.1* 7.4* 7.6* 7.6* 8.1* 7.9* 8.0*  HCT 23.6* 24.5* 24.9* 25.0* 27.2* 26.0* 25.5*  MCV 100.4* 101.7* 100.8* 100.4* 103.4* 101.2* 100.8*  PLT 56* 66* 91* 111* 116* 132* 160*   Basic Metabolic Panel: Recent Labs  Lab 01/13/18 0433 01/14/18 0611 01/15/18 0447 01/16/18 0347 01/17/18 0506    NA 126*  127* 131* 132*  132* 129*  129* 133*  134*  K 5.1  5.2* 4.0 4.6  4.5 4.5  4.5 3.8  3.9  CL 89*  89* 91* 93*  93* 91*  91* 97*  98  CO2 17*  18* 21* 14*  17* 20*  19* 21*  22  GLUCOSE 119*  120* 144* 93  95 181*  179* 172*  173*  BUN 64*  64* 44* 56*  57* 67*  68* 46*  45*  CREATININE 6.95*  6.91* 5.80* 6.92*  6.73* 7.83*  7.79* 5.64*  5.59*  CALCIUM 8.0*  8.1* 7.9* 8.0*  8.1* 8.0*  8.0* 7.9*  7.9*  MG 2.9* 2.5* 2.7* 2.9* 2.6*  PHOS 9.2*  9.4* 6.5* 8.1* 8.4* 6.2*   GFR: Estimated Creatinine Clearance: 14.2 mL/min (A) (by C-G formula based on SCr of 5.59 mg/dL (H)). Liver Function Tests: Recent Labs  Lab 01/12/18 0429 01/13/18 0433 01/14/18 7371 01/15/18 0447 01/16/18 0347 01/17/18 0506  AST 17 16  --   --   --   --  ALT 21 20  --   --   --   --   ALKPHOS 62 57  --   --   --   --   BILITOT 1.6* 1.6*  --   --   --   --   PROT 6.3* 6.0*  --   --   --   --   ALBUMIN 2.2* 2.2*  2.2* 2.2* 2.2* 2.1* 2.2*   No results for input(s): LIPASE, AMYLASE in the last 168 hours. No results for input(s): AMMONIA in the last 168 hours. Coagulation Profile: No results for input(s): INR, PROTIME in the last 168 hours. Cardiac Enzymes: No results for input(s): CKTOTAL, CKMB, CKMBINDEX, TROPONINI in the last 168 hours. BNP (last 3 results) No results for input(s): PROBNP in the last 8760 hours. HbA1C: No results for input(s): HGBA1C in the last 72 hours. CBG: Recent Labs  Lab 01/16/18 1200 01/16/18 1705 01/16/18 2351 01/17/18 0747 01/17/18 1147  GLUCAP 180* 190* 140* 167* 157*   Lipid Profile: No results for input(s): CHOL, HDL, LDLCALC, TRIG, CHOLHDL, LDLDIRECT in the last 72 hours. Thyroid Function Tests: No results for input(s): TSH, T4TOTAL, FREET4, T3FREE, THYROIDAB in the last 72 hours. Anemia Panel: No results for input(s): VITAMINB12, FOLATE, FERRITIN, TIBC, IRON, RETICCTPCT in the last 72 hours. Sepsis Labs: No results for  input(s): PROCALCITON, LATICACIDVEN in the last 168 hours.  No results found for this or any previous visit (from the past 240 hour(s)).     Radiology Studies: No results found.    Scheduled Meds: . Chlorhexidine Gluconate Cloth  6 each Topical Daily  . collagenase   Topical BID  . feeding supplement (NEPRO CARB STEADY)  237 mL Oral BID BM  . insulin aspart  0-5 Units Subcutaneous QHS  . insulin aspart  0-9 Units Subcutaneous TID WC  . levothyroxine  100 mcg Oral QAC breakfast  . mouth rinse  15 mL Mouth Rinse BID  . QUEtiapine  50 mg Oral QHS  . sodium chloride flush  10-40 mL Intracatheter Q12H  . sodium chloride flush  10-40 mL Intracatheter Q12H   Continuous Infusions: . sodium chloride 250 mL (01/11/18 2212)     LOS: 16 days    Time spent: 25 minutes   Dessa Phi, DO [facility] [email or web address] Password [facility] 01/17/2018, 12:49 PM

## 2018-01-17 NOTE — Progress Notes (Addendum)
[facility] Progress Note    Assessment/ Plan:   67y/o man with osteo rt 1st toe s/p TMA, afib, CKD III, CHF EF 25-30%, CASHD here with septic shock ruosepsis, hyperkalemia and found to have cirrhosis as well CRRT initially but now off.  1 AKI sepsis, bactrim, arb, NSAIDS. Oliguric , no recovery.  - HD Wed and then Sat (7/12)  - Dr. Jimmy Footman and I have already discussed with son limited trial HD of 1 wk and then Hospiceif no signs of improvement. 1 week mark would be mid week.   - Pt really just wants to go home. I would favor holding HD and checking for signs of improvement Mon/Tues and if no signs of recovery then discussing palliative again with the son. Another mid week treatment is not going to effect his prognosis.  - One of the  bladder scans seems signif but not documented as a PVR - Will check a PVR today and if greater than 300 then foley; not done yet so placed another order -> showed 500 but likely ascites as in and out foley had mucous return (small amt) only. D/w nursing and this happened previously also.  2 Anemia 3 Decub 4 severe debill 5 DM controlled 6 CM 7 CAD    Subjective:   Denies f/c/n/v/dyspnea; c/o thirst Tolerated HD last night.  He is asking to go home.     Objective:   BP (!) 100/58 (BP Location: Left Arm)   Pulse (!) 59   Temp 98.2 F (36.8 C)   Resp 20   Ht 5\' 9"  (1.753 m)   Wt 87.1 kg (192 lb 0.3 oz)   SpO2 96%   BMI 28.36 kg/m   Intake/Output Summary (Last 24 hours) at 01/17/2018 8315 Last data filed at 01/17/2018 0601 Gross per 24 hour  Intake 380 ml  Output 0 ml  Net 380 ml   Weight change:   Physical Exam: General appearance:no distress, pale andconfused Resp:diminished breath soundsbilaterallyand rhonchi bilaterally Cardio:S1, S2 normal and systolic murmur:systolic [id number]/1,[id number] 2nd left intercostal space JJ:KKXF, non-tender; bowel sounds normal; no masses, no  organomegaly Extremities:edematrand L groin temp cath GU: no foley    Imaging: No results found.  Labs: BMET Recent Labs  Lab 01/11/18 0426 01/12/18 0429 01/13/18 0433 01/14/18 0611 01/15/18 0447 01/16/18 0347 01/17/18 0506  NA 127* 128* 126*  127* 131* 132*  132* 129*  129* 133*  134*  K 4.5 4.9 5.1  5.2* 4.0 4.6  4.5 4.5  4.5 3.8  3.9  CL 92* 91* 89*  89* 91* 93*  93* 91*  91* 97*  98  CO2 21* 19* 17*  18* 21* 14*  17* 20*  19* 21*  22  GLUCOSE 120* 148* 119*  120* 144* 93  95 181*  179* 172*  173*  BUN 42* 51* 64*  64* 44* 56*  57* 67*  68* 46*  45*  CREATININE 4.91* 5.99* 6.95*  6.91* 5.80* 6.92*  6.73* 7.83*  7.79* 5.64*  5.59*  CALCIUM 7.8* 7.9* 8.0*  8.1* 7.9* 8.0*  8.1* 8.0*  8.0* 7.9*  7.9*  PHOS 6.6* 7.9* 9.2*  9.4* 6.5* 8.1* 8.4* 6.2*   CBC Recent Labs  Lab 01/11/18 0426 01/12/18 0429 01/13/18 0433 01/14/18 0611 01/15/18 0447 01/16/18 0347 01/17/18 0506  WBC 5.5 5.7 6.0 6.3 6.1 6.7 6.3  NEUTROABS 3.9 4.3 4.5  --   --   --   --   HGB 7.1* 7.4* 7.6* 7.6*  8.1* 7.9* 8.0*  HCT 23.6* 24.5* 24.9* 25.0* 27.2* 26.0* 25.5*  MCV 100.4* 101.7* 100.8* 100.4* 103.4* 101.2* 100.8*  PLT 56* 66* 91* 111* 116* 132* 132*    Medications:    . Chlorhexidine Gluconate Cloth  6 each Topical Daily  . collagenase   Topical BID  . feeding supplement (NEPRO CARB STEADY)  237 mL Oral BID BM  . insulin aspart  0-5 Units Subcutaneous QHS  . insulin aspart  0-9 Units Subcutaneous TID WC  . levothyroxine  100 mcg Oral QAC breakfast  . mouth rinse  15 mL Mouth Rinse BID  . QUEtiapine  50 mg Oral QHS  . sodium chloride flush  10-40 mL Intracatheter Q12H  . sodium chloride flush  10-40 mL Intracatheter Q12H      Otelia Santee, MD 01/17/2018, 7:09 AM

## 2018-01-18 LAB — GLUCOSE, CAPILLARY
GLUCOSE-CAPILLARY: 155 mg/dL — AB (ref 70–99)
GLUCOSE-CAPILLARY: 114 mg/dL — AB (ref 70–99)
GLUCOSE-CAPILLARY: 147 mg/dL — AB (ref 70–99)
Glucose-Capillary: 143 mg/dL — ABNORMAL HIGH (ref 70–99)

## 2018-01-18 LAB — CBC
HEMATOCRIT: 26.5 % — AB (ref 39.0–52.0)
Hemoglobin: 8.1 g/dL — ABNORMAL LOW (ref 13.0–17.0)
MCH: 31.4 pg (ref 26.0–34.0)
MCHC: 30.6 g/dL (ref 30.0–36.0)
MCV: 102.7 fL — ABNORMAL HIGH (ref 78.0–100.0)
PLATELETS: 114 10*3/uL — AB (ref 150–400)
RBC: 2.58 MIL/uL — ABNORMAL LOW (ref 4.22–5.81)
RDW: 25.9 % — AB (ref 11.5–15.5)
WBC: 6.8 10*3/uL (ref 4.0–10.5)

## 2018-01-18 LAB — RENAL FUNCTION PANEL
ALBUMIN: 2.2 g/dL — AB (ref 3.5–5.0)
Anion gap: 15 (ref 5–15)
BUN: 55 mg/dL — AB (ref 8–23)
CALCIUM: 8.1 mg/dL — AB (ref 8.9–10.3)
CHLORIDE: 97 mmol/L — AB (ref 98–111)
CO2: 22 mmol/L (ref 22–32)
CREATININE: 6.59 mg/dL — AB (ref 0.61–1.24)
GFR, EST AFRICAN AMERICAN: 9 mL/min — AB (ref >60)
GFR, EST NON AFRICAN AMERICAN: 8 mL/min — AB (ref >60)
Glucose, Bld: 154 mg/dL — ABNORMAL HIGH (ref 70–99)
Phosphorus: 7.4 mg/dL — ABNORMAL HIGH (ref 2.5–4.6)
Potassium: 4.5 mmol/L (ref 3.5–5.1)
SODIUM: 134 mmol/L — AB (ref 135–145)

## 2018-01-18 LAB — MAGNESIUM: MAGNESIUM: 2.5 mg/dL — AB (ref 1.7–2.4)

## 2018-01-18 NOTE — Progress Notes (Signed)
PROGRESS NOTE    Jose Heath  JZP:[id number] DOB: [birth year] DOA: 01/01/2018 PCP: Patient, No Pcp Per     Brief Narrative:  Jose Heath is a 67 year old with a history of CAD status post CABG, systolic congestive heart failure ejection fraction 25-30% with AICD in place, diabetes mellitus type 2, osteomyelitis of the right first toe status post amputation, A. fib on Eliquis, CKD stage III came from [facility] with complaints of fatigue and no urine output.  He was found to be in septic shock secondary to urinary tract infection complicated by osteomyelitis and AKI on CKD.  He was also hyperkalemic.  Central line was placed, 2 L bolus was given, broad-spectrum antibiotics-vancomycin and Zosyn and pressors were started.  He was also started on CRRT with nephrology recommendation.  He was in biventricular heart failure requiring dobutamine drip.  He also required Precedex and Haldol given extreme agitation but was eventually transitioned to stepdown unit once he was stabilized and weaned off the drips.  Due to patient's chronic comorbidities and poor quality of life, palliative care service was consulted who recommended hospice care.  After prolonged discussion with nephrology team and palliative care service it was determined to try dialysis for about a week and then transition him to residential hospice if without significant improvement.   New events last 24 hours / Subjective: No issues. No complaints today.   Assessment & Plan:   Principal Problem:   AKI (acute kidney injury) ([place]) Active Problems:   Diabetes mellitus ( [place])   Septic shock ([place])   Pressure injury of skin   Malnutrition of moderate degree   CKD (chronic kidney disease), stage III ([place])   AKI superimposed on CKD stage III -Secondary to sepsis, bactrim, ARB, NSAIDS. Oliguric, no renal recovery so far even with hemodialysis, last HD session on 01/16/2018. If no renal recovery by mid-week, would recommend  transition to hospice   Acute respiratory distress with hypoxia Acute systolic congestive heart failure with pulmonary edema, ejection fraction 25% -Stable today  Agitation and delirium superimposed on baseline dementia -Continue Seroquel 50 mg at bedtime -Tele sitter for safety   History of liver cirrhosis/Moderate to large volume ascites -Stable  Macrocytic anemia/thrombocytopenia -Stable  Sacral decubitus ulcer -Wound care consult appreciated  Hypothyroidism -Continue Synthroid   DM 2 -SSI   Moderate protein calorie malnutrition -Encourage oral diet, feeding supplements- on Nepro   DVT prophylaxis: Subq hep  Code Status: DNR Family Communication: No family at bedside Disposition Plan: Pending observation, likely transition to hospice mid-week if without improvement   Consultants:   Nephrology  Palliative care   Antimicrobials:  Anti-infectives (From admission, onward)   Start     Dose/Rate Route Frequency Ordered Stop   01/11/18 2200  piperacillin-tazobactam (ZOSYN) IVPB 3.375 g     3.375 g 12.5 mL/hr over 240 Minutes Intravenous Every 12 hours 01/11/18 1414 01/12/18 1200   01/08/18 1200  piperacillin-tazobactam (ZOSYN) IVPB 2.25 g  Status:  Discontinued     2.25 g 100 mL/hr over 30 Minutes Intravenous Every 6 hours 01/08/18 0856 01/11/18 1414   01/06/18 2000  vancomycin (VANCOCIN) IVPB 750 mg/150 ml premix  Status:  Discontinued     750 mg 150 mL/hr over 60 Minutes Intravenous Every 24 hours 01/06/18 1531 01/08/18 0856   01/02/18 1400  vancomycin (VANCOCIN) IVPB 1000 mg/200 mL premix  Status:  Discontinued     1,000 mg 200 mL/hr over 60 Minutes Intravenous Every 24 hours 01/01/18 2354 01/06/18 1531  01/02/18 0200  piperacillin-tazobactam (ZOSYN) IVPB 3.375 g  Status:  Discontinued     3.375 g 12.5 mL/hr over 240 Minutes Intravenous Every 12 hours 01/01/18 2049 01/01/18 2350   01/02/18 0200  piperacillin-tazobactam (ZOSYN) IVPB 3.375 g  Status:   Discontinued     3.375 g 100 mL/hr over 30 Minutes Intravenous Every 6 hours 01/01/18 2351 01/08/18 0856       Objective: Vitals:   01/17/18 1640 01/17/18 2144 01/18/18 0547 01/18/18 0913  BP: 103/71 103/68 106/65 110/69  Pulse: 70 69 70 60  Resp: 18 (!) 21 16 17   Temp:  (!) 97.5 F (36.4 C) 98.3 F (36.8 C) 98.8 F (37.1 C)  TempSrc:  Oral  Axillary  SpO2: 96% 91%  91%  Weight:  87.1 kg (192 lb 0.2 oz)    Height:        Intake/Output Summary (Last 24 hours) at 01/18/2018 1112 Last data filed at 01/18/2018 0900 Gross per 24 hour  Intake 760 ml  Output 0 ml  Net 760 ml   Filed Weights   01/16/18 2315 01/17/18 0526 01/17/18 2144  Weight: 87.1 kg (192 lb 0.3 oz) 87.1 kg (192 lb 0.3 oz) 87.1 kg (192 lb 0.2 oz)    Examination: General exam: Appears calm and comfortable  Respiratory system: Clear to auscultation. Respiratory effort normal. Cardiovascular system: S1 & S2 heard, RRR. No JVD, murmurs, rubs, gallops or clicks. No pedal edema. Gastrointestinal system: Abdomen is nondistended, soft and nontender. No organomegaly or masses felt. Normal bowel sounds heard. Central nervous system: Alert, nonfocal  Extremities: Symmetric 5 x 5 power. Skin: No rashes, lesions or ulcers Psychiatry: Judgement and insight appear stable, poor, dementia    Data Reviewed: I have personally reviewed following labs and imaging studies  CBC: Recent Labs  Lab 01/12/18 0429 01/13/18 0433 01/14/18 0611 01/15/18 0447 01/16/18 0347 01/17/18 0506 01/18/18 0211  WBC 5.7 6.0 6.3 6.1 6.7 6.3 6.8  NEUTROABS 4.3 4.5  --   --   --   --   --   HGB 7.4* 7.6* 7.6* 8.1* 7.9* 8.0* 8.1*  HCT 24.5* 24.9* 25.0* 27.2* 26.0* 25.5* 26.5*  MCV 101.7* 100.8* 100.4* 103.4* 101.2* 100.8* 102.7*  PLT 66* 91* 111* 116* 132* 132* 962*   Basic Metabolic Panel: Recent Labs  Lab 01/14/18 0611 01/15/18 0447 01/16/18 0347 01/17/18 0506 01/18/18 0211  NA 131* 132*  132* 129*  129* 133*  134* 134*  K 4.0  4.6  4.5 4.5  4.5 3.8  3.9 4.5  CL 91* 93*  93* 91*  91* 97*  98 97*  CO2 21* 14*  17* 20*  19* 21*  22 22  GLUCOSE 144* 93  95 181*  179* 172*  173* 154*  BUN 44* 56*  57* 67*  68* 46*  45* 55*  CREATININE 5.80* 6.92*  6.73* 7.83*  7.79* 5.64*  5.59* 6.59*  CALCIUM 7.9* 8.0*  8.1* 8.0*  8.0* 7.9*  7.9* 8.1*  MG 2.5* 2.7* 2.9* 2.6* 2.5*  PHOS 6.5* 8.1* 8.4* 6.2* 7.4*   GFR: Estimated Creatinine Clearance: 12.1 mL/min (A) (by C-G formula based on SCr of 6.59 mg/dL (H)). Liver Function Tests: Recent Labs  Lab 01/12/18 0429 01/13/18 0433 01/14/18 0611 01/15/18 0447 01/16/18 0347 01/17/18 0506 01/18/18 0211  AST 17 16  --   --   --   --   --   ALT 21 20  --   --   --   --   --  ALKPHOS 62 57  --   --   --   --   --   BILITOT 1.6* 1.6*  --   --   --   --   --   PROT 6.3* 6.0*  --   --   --   --   --   ALBUMIN 2.2* 2.2*  2.2* 2.2* 2.2* 2.1* 2.2* 2.2*   No results for input(s): LIPASE, AMYLASE in the last 168 hours. No results for input(s): AMMONIA in the last 168 hours. Coagulation Profile: No results for input(s): INR, PROTIME in the last 168 hours. Cardiac Enzymes: No results for input(s): CKTOTAL, CKMB, CKMBINDEX, TROPONINI in the last 168 hours. BNP (last 3 results) No results for input(s): PROBNP in the last 8760 hours. HbA1C: No results for input(s): HGBA1C in the last 72 hours. CBG: Recent Labs  Lab 01/17/18 0747 01/17/18 1147 01/17/18 1635 01/17/18 2117 01/18/18 0731  GLUCAP 167* 157* 182* 121* 155*   Lipid Profile: No results for input(s): CHOL, HDL, LDLCALC, TRIG, CHOLHDL, LDLDIRECT in the last 72 hours. Thyroid Function Tests: No results for input(s): TSH, T4TOTAL, FREET4, T3FREE, THYROIDAB in the last 72 hours. Anemia Panel: No results for input(s): VITAMINB12, FOLATE, FERRITIN, TIBC, IRON, RETICCTPCT in the last 72 hours. Sepsis Labs: No results for input(s): PROCALCITON, LATICACIDVEN in the last 168 hours.  No results found for  this or any previous visit (from the past 240 hour(s)).     Radiology Studies: No results found.    Scheduled Meds: . Chlorhexidine Gluconate Cloth  6 each Topical Daily  . collagenase   Topical BID  . feeding supplement (NEPRO CARB STEADY)  237 mL Oral BID BM  . insulin aspart  0-5 Units Subcutaneous QHS  . insulin aspart  0-9 Units Subcutaneous TID WC  . levothyroxine  100 mcg Oral QAC breakfast  . mouth rinse  15 mL Mouth Rinse BID  . QUEtiapine  50 mg Oral QHS  . sodium chloride flush  10-40 mL Intracatheter Q12H  . sodium chloride flush  10-40 mL Intracatheter Q12H   Continuous Infusions: . sodium chloride 250 mL (01/11/18 2212)     LOS: 17 days    Time spent: 20 minutes   Dessa Phi, DO [facility] [email or web address] Password [email or web address] 01/18/2018, 11:12 AM

## 2018-01-18 NOTE — Progress Notes (Signed)
[facility] ROUNDING NOTE   Subjective:   Interval History: has no complaint of sacral pain in the area of his sacral decubitus ulcer. He otherwise wishes to be discharged home as soon as he is permitted to do so.    Objective:  Vital signs in last 24 hours:  Temp:  [97.5 F (36.4 C)-98.3 F (36.8 C)] 98.3 F (36.8 C) (07/15 0547) Pulse Rate:  [69-70] 70 (07/15 0547) Resp:  [16-21] 16 (07/15 0547) BP: (103-106)/(65-71) 106/65 (07/15 0547) SpO2:  [91 %-96 %] 91 % (07/14 2144) Weight:  [192 lb 0.2 oz (87.1 kg)] 192 lb 0.2 oz (87.1 kg) (07/14 2144)  Weight change: -0.1 oz (-0.004 kg) Filed Weights   01/16/18 2315 01/17/18 0526 01/17/18 2144  Weight: 192 lb 0.3 oz (87.1 kg) 192 lb 0.3 oz (87.1 kg) 192 lb 0.2 oz (87.1 kg)    Intake/Output: I/O last 3 completed shifts: In: 640 [P.O.:640] Out: 15 [Urine:15]   Intake/Output this shift:  Total I/O In: 240 [P.O.:240] Out: -   GEN: A&O x 4, in no acute distress, afebrile, nondiaphoretic CVS- RRR, no murmurs rubs of gallops RS- CTA bilaterally ABD- BS present soft non-distended, non tender EXT- no edema bilateral distal lower extremities   Basic Metabolic Panel: Recent Labs  Lab 01/14/18 0611 01/15/18 0447 01/16/18 0347 01/17/18 0506 01/18/18 0211  NA 131* 132*  132* 129*  129* 133*  134* 134*  K 4.0 4.6  4.5 4.5  4.5 3.8  3.9 4.5  CL 91* 93*  93* 91*  91* 97*  98 97*  CO2 21* 14*  17* 20*  19* 21*  22 22  GLUCOSE 144* 93  95 181*  179* 172*  173* 154*  BUN 44* 56*  57* 67*  68* 46*  45* 55*  CREATININE 5.80* 6.92*  6.73* 7.83*  7.79* 5.64*  5.59* 6.59*  CALCIUM 7.9* 8.0*  8.1* 8.0*  8.0* 7.9*  7.9* 8.1*  MG 2.5* 2.7* 2.9* 2.6* 2.5*  PHOS 6.5* 8.1* 8.4* 6.2* 7.4*    Liver Function Tests: Recent Labs  Lab 01/12/18 0429 01/13/18 0433 01/14/18 0611 01/15/18 0447 01/16/18 0347 01/17/18 0506 01/18/18 0211  AST 17 16  --   --   --   --   --   ALT 21 20  --   --   --   --    --   ALKPHOS 62 57  --   --   --   --   --   BILITOT 1.6* 1.6*  --   --   --   --   --   PROT 6.3* 6.0*  --   --   --   --   --   ALBUMIN 2.2* 2.2*  2.2* 2.2* 2.2* 2.1* 2.2* 2.2*   CBC: Recent Labs  Lab 01/12/18 0429 01/13/18 0433 01/14/18 0611 01/15/18 0447 01/16/18 0347 01/17/18 0506 01/18/18 0211  WBC 5.7 6.0 6.3 6.1 6.7 6.3 6.8  NEUTROABS 4.3 4.5  --   --   --   --   --   HGB 7.4* 7.6* 7.6* 8.1* 7.9* 8.0* 8.1*  HCT 24.5* 24.9* 25.0* 27.2* 26.0* 25.5* 26.5*  MCV 101.7* 100.8* 100.4* 103.4* 101.2* 100.8* 102.7*  PLT 66* 91* 111* 116* 132* 132* 114*    Cardiac Enzymes: No results for input(s): CKTOTAL, CKMB, CKMBINDEX, TROPONINI in the last 168 hours.  BNP: Invalid input(s): POCBNP  CBG: Recent Labs  Lab 01/17/18 0747 01/17/18 1147 01/17/18  1635 01/17/18 2117 01/18/18 0731  GLUCAP 167* 157* 182* 121* 155*     Coagulation Studies: No results for input(s): LABPROT, INR in the last 72 hours.  Urinalysis: No results for input(s): COLORURINE, LABSPEC, PHURINE, GLUCOSEU, HGBUR, BILIRUBINUR, KETONESUR, PROTEINUR, UROBILINOGEN, NITRITE, LEUKOCYTESUR in the last 72 hours.  Invalid input(s): APPERANCEUR    Imaging: No results found.   Medications:   . sodium chloride 250 mL (01/11/18 2212)   . Chlorhexidine Gluconate Cloth  6 each Topical Daily  . collagenase   Topical BID  . feeding supplement (NEPRO CARB STEADY)  237 mL Oral BID BM  . insulin aspart  0-5 Units Subcutaneous QHS  . insulin aspart  0-9 Units Subcutaneous TID WC  . levothyroxine  100 mcg Oral QAC breakfast  . mouth rinse  15 mL Mouth Rinse BID  . QUEtiapine  50 mg Oral QHS  . sodium chloride flush  10-40 mL Intracatheter Q12H  . sodium chloride flush  10-40 mL Intracatheter Q12H   sodium chloride, acetaminophen, albuterol, Gerhardt's butt cream, haloperidol lactate, ondansetron (ZOFRAN) IV, sodium chloride flush, sodium chloride flush, traMADol  Assessment/ Plan:  Jose Heath is a 67  yo M w/ a PMHx notable for CAD s/p CABG, HFrEF of 25-30% w/ AICD placed, DMII, A-fib on Eliquis, CKD III and osteomyelitis of the right first toe s/p amputation who presented from [facility] w/ fatigue and anuria. Noted to be in septic shock 2/2 urinary source w/ ARI on CKD and marked hyperkalemia. Tx w/ Vanc/Zosyn w/ pressors started. Plan was to attempt a week of HD and monitor for improvement where without such a palliative approach would then be taken.      Acute renal injury 2/2 urosepsis-One week trial of HD attempted w/ Last HD session 07/13, plan for one more early this week and if no improvement as per Dr.'s Deterding/Lin agree that HD would not benefit long term given his comorbidities. . Cr 6.59 with BUN 55 both up today. K+/Na 4.5/134. Mag 2.5  ANEMIA-Hgb stable at 8.1 today, continue to monitor   HTN/VOL- Patient is +273ml today with BP on the soft side at 106/65.   ACCESS-Left femoral HD catheter site appears nonedematous, nonerythematous    LOS: 17 Kathi Ludwig, MD Internal Medicine Resident, PGY-2   Renal Attending:   He reports increase UOP, but this is not verified. He has markedly diminished skin turgor/pos tenting> Creat continues to rise. Will see if UOP increases any and plan for next HD, as needed, for Wed.  Estanislado Emms, MD

## 2018-01-19 LAB — RENAL FUNCTION PANEL
ALBUMIN: 2.2 g/dL — AB (ref 3.5–5.0)
Anion gap: 17 — ABNORMAL HIGH (ref 5–15)
BUN: 68 mg/dL — AB (ref 8–23)
CO2: 20 mmol/L — ABNORMAL LOW (ref 22–32)
Calcium: 8.2 mg/dL — ABNORMAL LOW (ref 8.9–10.3)
Chloride: 97 mmol/L — ABNORMAL LOW (ref 98–111)
Creatinine, Ser: 7.48 mg/dL — ABNORMAL HIGH (ref 0.61–1.24)
GFR calc Af Amer: 8 mL/min — ABNORMAL LOW (ref >60)
GFR, EST NON AFRICAN AMERICAN: 7 mL/min — AB (ref >60)
Glucose, Bld: 143 mg/dL — ABNORMAL HIGH (ref 70–99)
PHOSPHORUS: 8.9 mg/dL — AB (ref 2.5–4.6)
Potassium: 5.1 mmol/L (ref 3.5–5.1)
Sodium: 134 mmol/L — ABNORMAL LOW (ref 135–145)

## 2018-01-19 LAB — GLUCOSE, CAPILLARY
GLUCOSE-CAPILLARY: 138 mg/dL — AB (ref 70–99)
Glucose-Capillary: 126 mg/dL — ABNORMAL HIGH (ref 70–99)
Glucose-Capillary: 115 mg/dL — ABNORMAL HIGH (ref 70–99)
Glucose-Capillary: 182 mg/dL — ABNORMAL HIGH (ref 70–99)

## 2018-01-19 MED ORDER — CHLORHEXIDINE GLUCONATE CLOTH 2 % EX PADS
6.0000 | MEDICATED_PAD | Freq: Every day | CUTANEOUS | Status: DC
  Administered 2018-01-21: 6 via TOPICAL

## 2018-01-19 NOTE — Progress Notes (Signed)
PROGRESS NOTE    Jose Heath  MSX:[id number] DOB: 1950/10/16 DOA: 01/01/2018 PCP: Patient, No Pcp Per     Brief Narrative:  Jose Heath is a 67 year old with a history of CAD status post CABG, systolic congestive heart failure ejection fraction 25-30% with AICD in place, diabetes mellitus type 2, osteomyelitis of the right first toe status post amputation, A. fib on Eliquis, CKD stage III came from [facility] with complaints of fatigue and no urine output.  He was found to be in septic shock secondary to urinary tract infection complicated by osteomyelitis and AKI on CKD.  He was also hyperkalemic.  Central line was placed, 2 L bolus was given, broad-spectrum antibiotics-vancomycin and Zosyn and pressors were started.  He was also started on CRRT with nephrology recommendation.  He was in biventricular heart failure requiring dobutamine drip.  He also required Precedex and Haldol given extreme agitation but was eventually transitioned to stepdown unit once he was stabilized and weaned off the drips.  Due to patient's chronic comorbidities and poor quality of life, palliative care service was consulted who recommended hospice care.  After prolonged discussion with nephrology team and palliative care service it was determined to try dialysis for about a week and then transition him to residential hospice if without significant improvement.   New events last 24 hours / Subjective: No change, patient remains more sleepy today. No documented urine output noted.   I spoke with son over the phone today. Our options are to continue HD (looks like Nephrology plans for one more session tomorrow) and see if there is any improvement vs proceeding with comfort care and hospice. Family plans to visit with patient today and discuss amongst themselves. I also reached out to palliative care so they can follow up today or tomorrow.   Assessment & Plan:   Principal Problem:   AKI (acute kidney  injury) ([place]) Active Problems:   Diabetes mellitus ([place])   Septic shock ([place])   Pressure injury of skin   Malnutrition of moderate degree   CKD (chronic kidney disease), stage III ([place])   AKI superimposed on CKD stage III -Secondary to sepsis, bactrim, ARB, NSAIDS. Oliguric, no renal recovery so far even with hemodialysis, last HD session on 01/16/2018. If no renal recovery by mid-week, would recommend transition to hospice   Acute respiratory distress with hypoxia Acute systolic congestive heart failure with pulmonary edema, ejection fraction 25% -Stable today  Agitation and delirium superimposed on baseline dementia -Continue Seroquel 50 mg at bedtime -Tele sitter for safety   History of liver cirrhosis/Moderate to large volume ascites -Stable  Macrocytic anemia/thrombocytopenia -Stable  Sacral decubitus ulcer -Wound care consult appreciated  Hypothyroidism -Continue Synthroid   DM 2 -SSI   Moderate protein calorie malnutrition -Encourage oral diet, feeding supplements- on Nepro   DVT prophylaxis: Subq hep  Code Status: DNR Family Communication: No family at bedside, spoke with son over the phone today  Disposition Plan: Pending observation, likely transition to hospice mid-week if without improvement   Consultants:   Nephrology  Palliative care   Antimicrobials:  Anti-infectives (From admission, onward)   Start     Dose/Rate Route Frequency Ordered Stop   01/11/18 2200  piperacillin-tazobactam (ZOSYN) IVPB 3.375 g     3.375 g 12.5 mL/hr over 240 Minutes Intravenous Every 12 hours 01/11/18 1414 01/12/18 1200   01/08/18 1200  piperacillin-tazobactam (ZOSYN) IVPB 2.25 g  Status:  Discontinued     2.25 g 100 mL/hr over 30  Minutes Intravenous Every 6 hours 01/08/18 0856 01/11/18 1414   01/06/18 2000  vancomycin (VANCOCIN) IVPB 750 mg/150 ml premix  Status:  Discontinued     750 mg 150 mL/hr over 60 Minutes Intravenous Every 24 hours 01/06/18 1531  01/08/18 0856   01/02/18 1400  vancomycin (VANCOCIN) IVPB 1000 mg/200 mL premix  Status:  Discontinued     1,000 mg 200 mL/hr over 60 Minutes Intravenous Every 24 hours 01/01/18 2354 01/06/18 1531   01/02/18 0200  piperacillin-tazobactam (ZOSYN) IVPB 3.375 g  Status:  Discontinued     3.375 g 12.5 mL/hr over 240 Minutes Intravenous Every 12 hours 01/01/18 2049 01/01/18 2350   01/02/18 0200  piperacillin-tazobactam (ZOSYN) IVPB 3.375 g  Status:  Discontinued     3.375 g 100 mL/hr over 30 Minutes Intravenous Every 6 hours 01/01/18 2351 01/08/18 0856       Objective: Vitals:   01/18/18 1812 01/18/18 2026 01/19/18 0457 01/19/18 0945  BP: 104/62 (!) 141/101 108/61 (!) 100/59  Pulse: (!) 59 70 69 70  Resp: 18 16 20 18   Temp: (!) 97.5 F (36.4 C) (!) 97.4 F (36.3 C) 98.3 F (36.8 C) 97.7 F (36.5 C)  TempSrc: Oral Oral Oral Oral  SpO2: 98% 100% 99% 100%  Weight:   85.7 kg (189 lb)   Height:        Intake/Output Summary (Last 24 hours) at 01/19/2018 1025 Last data filed at 01/19/2018 0600 Gross per 24 hour  Intake 402 ml  Output 0 ml  Net 402 ml   Filed Weights   01/17/18 0526 01/17/18 2144 01/19/18 0457  Weight: 87.1 kg (192 lb 0.3 oz) 87.1 kg (192 lb 0.2 oz) 85.7 kg (189 lb)    Examination: General exam: Appears calm and comfortable, fatigued  Respiratory system: Clear to auscultation. Respiratory effort normal. Cardiovascular system: S1 & S2 heard, RRR. No JVD, murmurs, rubs, gallops or clicks. No pedal edema. Gastrointestinal system: Abdomen is nondistended, soft and nontender. No organomegaly or masses felt. Normal bowel sounds heard. Central nervous system: Alert to voice, less interactive today, very sleepy  Extremities: Symmetric Psychiatry: Dementia     Data Reviewed: I have personally reviewed following labs and imaging studies  CBC: Recent Labs  Lab 01/13/18 0433 01/14/18 0611 01/15/18 0447 01/16/18 0347 01/17/18 0506 01/18/18 0211  WBC 6.0 6.3 6.1  6.7 6.3 6.8  NEUTROABS 4.5  --   --   --   --   --   HGB 7.6* 7.6* 8.1* 7.9* 8.0* 8.1*  HCT 24.9* 25.0* 27.2* 26.0* 25.5* 26.5*  MCV 100.8* 100.4* 103.4* 101.2* 100.8* 102.7*  PLT 91* 111* 116* 132* 132* 782*   Basic Metabolic Panel: Recent Labs  Lab 01/14/18 0611 01/15/18 0447 01/16/18 0347 01/17/18 0506 01/18/18 0211 01/19/18 0404  NA 131* 132*  132* 129*  129* 133*  134* 134* 134*  K 4.0 4.6  4.5 4.5  4.5 3.8  3.9 4.5 5.1  CL 91* 93*  93* 91*  91* 97*  98 97* 97*  CO2 21* 14*  17* 20*  19* 21*  22 22 20*  GLUCOSE 144* 93  95 181*  179* 172*  173* 154* 143*  BUN 44* 56*  57* 67*  68* 46*  45* 55* 68*  CREATININE 5.80* 6.92*  6.73* 7.83*  7.79* 5.64*  5.59* 6.59* 7.48*  CALCIUM 7.9* 8.0*  8.1* 8.0*  8.0* 7.9*  7.9* 8.1* 8.2*  MG 2.5* 2.7* 2.9* 2.6* 2.5*  --  PHOS 6.5* 8.1* 8.4* 6.2* 7.4* 8.9*   GFR: Estimated Creatinine Clearance: 10.5 mL/min (A) (by C-G formula based on SCr of 7.48 mg/dL (H)). Liver Function Tests: Recent Labs  Lab 01/13/18 0433  01/15/18 0447 01/16/18 0347 01/17/18 0506 01/18/18 0211 01/19/18 0404  AST 16  --   --   --   --   --   --   ALT 20  --   --   --   --   --   --   ALKPHOS 57  --   --   --   --   --   --   BILITOT 1.6*  --   --   --   --   --   --   PROT 6.0*  --   --   --   --   --   --   ALBUMIN 2.2*  2.2*   < > 2.2* 2.1* 2.2* 2.2* 2.2*   < > = values in this interval not displayed.   No results for input(s): LIPASE, AMYLASE in the last 168 hours. No results for input(s): AMMONIA in the last 168 hours. Coagulation Profile: No results for input(s): INR, PROTIME in the last 168 hours. Cardiac Enzymes: No results for input(s): CKTOTAL, CKMB, CKMBINDEX, TROPONINI in the last 168 hours. BNP (last 3 results) No results for input(s): PROBNP in the last 8760 hours. HbA1C: No results for input(s): HGBA1C in the last 72 hours. CBG: Recent Labs  Lab 01/18/18 0731 01/18/18 1210 01/18/18 1728 01/18/18 2148  01/19/18 0747  GLUCAP 155* 114* 147* 143* 138*   Lipid Profile: No results for input(s): CHOL, HDL, LDLCALC, TRIG, CHOLHDL, LDLDIRECT in the last 72 hours. Thyroid Function Tests: No results for input(s): TSH, T4TOTAL, FREET4, T3FREE, THYROIDAB in the last 72 hours. Anemia Panel: No results for input(s): VITAMINB12, FOLATE, FERRITIN, TIBC, IRON, RETICCTPCT in the last 72 hours. Sepsis Labs: No results for input(s): PROCALCITON, LATICACIDVEN in the last 168 hours.  No results found for this or any previous visit (from the past 240 hour(s)).     Radiology Studies: No results found.    Scheduled Meds: . Chlorhexidine Gluconate Cloth  6 each Topical Daily  . collagenase   Topical BID  . feeding supplement (NEPRO CARB STEADY)  237 mL Oral BID BM  . insulin aspart  0-5 Units Subcutaneous QHS  . insulin aspart  0-9 Units Subcutaneous TID WC  . levothyroxine  100 mcg Oral QAC breakfast  . mouth rinse  15 mL Mouth Rinse BID  . QUEtiapine  50 mg Oral QHS  . sodium chloride flush  10-40 mL Intracatheter Q12H  . sodium chloride flush  10-40 mL Intracatheter Q12H   Continuous Infusions: . sodium chloride 250 mL (01/11/18 2212)     LOS: 18 days    Time spent: 30 minutes   Dessa Phi, DO [facility] [email or web address] Password [facility] 01/19/2018, 10:25 AM

## 2018-01-19 NOTE — Progress Notes (Signed)
Nutrition Follow-up  DOCUMENTATION CODES:   Non-severe (moderate) malnutrition in context of chronic illness  INTERVENTION:   Continue Nepro Shake po BID, each supplement provides 425 kcal and 19 grams protein  Continue Magic cup TID with meals, each supplement provides 290 kcal and 9 grams of protein  NUTRITION DIAGNOSIS:   Moderate Malnutrition related to chronic illness(CKD, CHF, CAD) as evidenced by mild fat depletion, mild muscle depletion, moderate muscle depletion.  Continues but being addressed via supplements  GOAL:   Patient will meet greater than or equal to 90% of their needs  Not Met  MONITOR:   PO intake, Supplement acceptance  REASON FOR ASSESSMENT:   Low Braden    ASSESSMENT:   67 yo male with PMH of AICD, CHF, DM, CKD, A fib, osteomyelitis of foot (s/p transmetatarsal amputation), CAD, cardiomyopathy, anemia who was admitted on 6/28 with septic shock.  Palliative care following Noted plan for final HD session tomorrow. Noted further discussion with family regarding poc post last HD tomorrow  Pt sleeping a lot today. Arousable Family at bedside. Son reports pt woke up to take meds this AM but did not eat breakfast and otherwise has been sleeping. Recorded po intake 0-50% of meals. Son reports pt likes Magic Cup (orange cream one) and that he eats this well. Pt also drinks some of the Nepro but noted per [facility], pt only taking 1 per day and unsure if pt drinking the whole thing  Labs: sodium 134, phosphorus 8.9 (H), Creatinine 7.48, BUN 68 Meds: reviewed  Diet Order:   Diet Order           Diet renal with fluid restriction Fluid restriction: 1200 mL Fluid; Room service appropriate? Yes; Fluid consistency: Thin  Diet effective now          EDUCATION NEEDS:   No education needs have been identified at this time  Skin:  Skin Assessment: Skin Integrity Issues: Skin Integrity Issues:: Unstageable, Other (Comment) Unstageable: sacrum Other: MASD; R  foot large non-healing wound  Last BM:  7/9  Height:   Ht Readings from Last 1 Encounters:  01/02/18 '5\' 9"'$  (1.753 m)    Weight:   Wt Readings from Last 1 Encounters:  01/19/18 189 lb (85.7 kg)    Ideal Body Weight:  72.7 kg  BMI:  Body mass index is 27.91 kg/m.  Estimated Nutritional Needs:   Kcal:  2100-2300  Protein:  120-145 gm  Fluid:  1000 mL plus UOP   [employer] MS, RD, LDN, CNSC [phone] Pager  [phone] Weekend/On-Call Pager

## 2018-01-19 NOTE — Progress Notes (Addendum)
Daily Progress Note   Patient Name: Jose Heath       Date: 01/19/2018 DOB: [birth year]  Age: 67 y.o. MRN#: [id number] Attending Physician: Dessa Phi, DO Primary Care Physician: Patient, No Pcp Per Admit Date: 01/01/2018  Reason for Consultation/Follow-up: Establishing goals of care  Subjective: Patient is asleep in bed. He is easily awaken but drifts back to sleep.  He did respond "No" when asked if he was experiencing any pain or discomfort. Otherwise,would not answer questions or respond to commands other than to squeezing to my hands. No family is at bedside. Staff continues to report minimum to poor appetite. Continues with tele sitter for safety.   Length of Stay: 18  Current Medications: Scheduled Meds:  . Chlorhexidine Gluconate Cloth  6 each Topical Daily  . collagenase   Topical BID  . feeding supplement (NEPRO CARB STEADY)  237 mL Oral BID BM  . insulin aspart  0-5 Units Subcutaneous QHS  . insulin aspart  0-9 Units Subcutaneous TID WC  . levothyroxine  100 mcg Oral QAC breakfast  . mouth rinse  15 mL Mouth Rinse BID  . QUEtiapine  50 mg Oral QHS  . sodium chloride flush  10-40 mL Intracatheter Q12H  . sodium chloride flush  10-40 mL Intracatheter Q12H    Continuous Infusions: . sodium chloride 250 mL (01/11/18 2212)    PRN Meds: sodium chloride, acetaminophen, albuterol, Gerhardt's butt cream, haloperidol lactate, ondansetron (ZOFRAN) IV, sodium chloride flush, sodium chloride flush, traMADol  Physical Exam  Constitutional: Vital signs are normal. He is easily aroused. He has a sickly appearance.  Fragile and thin in appearance.   Cardiovascular: Normal rate, regular rhythm and normal heart sounds. Exam reveals decreased pulses.  Pulmonary/Chest: Effort  normal. He has decreased breath sounds.  Abdominal: Normal appearance and bowel sounds are normal.  Musculoskeletal:  Generalized weakness, bilateral boots   Neurological: He is alert and easily aroused. He displays no atrophy.  Skin: Skin is warm. Bruising noted.  Right foot dressing clean, dry, intact, scattered bruising,  Psychiatric:  dementia   Nursing note and vitals reviewed.            Vital Signs: BP (!) 100/59 (BP Location: Left Arm)   Pulse 70   Temp 97.7 F (36.5 C) (Oral)   Resp 18  Ht 5\' 9"  (1.753 m)   Wt 85.7 kg (189 lb)   SpO2 100%   BMI 27.91 kg/m  SpO2: SpO2: 100 % O2 Device: O2 Device: Nasal Cannula O2 Flow Rate: O2 Flow Rate (L/min): 2 L/min  Intake/output summary:   Intake/Output Summary (Last 24 hours) at 01/19/2018 1019 Last data filed at 01/19/2018 0600 Gross per 24 hour  Intake 402 ml  Output 0 ml  Net 402 ml   LBM: Last BM Date: 01/18/18 Baseline Weight: Weight: 98.9 kg (218 lb 0.6 oz) Most recent weight: Weight: 85.7 kg (189 lb)       Palliative Assessment/Data: PPS 20%   Flowsheet Rows     Most Recent Value  Intake Tab  Referral Department  Hospitalist  Unit at Time of Referral  ICU  Palliative Care Primary Diagnosis  Nephrology  Date Notified  01/08/18  Palliative Care Type  New Palliative care  Reason for referral  Clarify Goals of Care  Date of Admission  01/01/18  Date first seen by Palliative Care  01/10/18  # of days Palliative referral response time  2 Day(s)  # of days IP prior to Palliative referral  7  Clinical Assessment  Psychosocial & Spiritual Assessment  Palliative Care Outcomes     Patient Active Problem List   Diagnosis Date Noted  . AKI (acute kidney injury) ([place]) 01/17/2018  . CKD (chronic kidney disease), stage III ([place]) 01/17/2018  . Malnutrition of moderate degree 01/07/2018  . Pressure injury of skin 01/02/2018  . Septic shock ([place]) 01/01/2018  . Arthritis 09/17/2016  . Atrial flutter ([place])  09/17/2016  . Coronary artery disease 09/17/2016  . Diabetes mellitus ([place]) 09/17/2016  . Disease of thyroid gland 09/17/2016  . HOH (hard of hearing) 09/17/2016  . Hyperlipidemia 09/17/2016  . ICD (implantable cardioverter-defibrillator) in place 09/17/2016  . Left foot pain 09/17/2016  . Skin cancer 09/17/2016  . TSH (thyroid-stimulating hormone deficiency) 08/13/2016  . Anasarca 07/30/2016  . Charcot foot due to diabetes mellitus ([place]) 02/13/2016  . Atypical atrial flutter ([place]) 10/04/2015  . Essential hypertension 10/04/2015  . Ischemic cardiomyopathy 10/04/2015  . Pure hypercholesterolemia 10/04/2015  . Amputated toe of left foot ([place]) 08/15/2015  . Diabetic peripheral neuropathy ([place]) 08/15/2015  . Type 2 diabetes mellitus with Charcot's joint of left foot ([place]) 08/15/2015  . Cardiac defibrillator in place 05/29/2015  . Cardiomyopathy ([place]) 05/29/2015  . Dual ICD (implantable cardioverter-defibrillator) in place 05/29/2015   Palliative Care Assessment & Plan   Patient Profile: 67 y.o.malead[facility] on [facility] with complaints of fatigue and no urine output x1day. He has a past medical history significant for AICD, diabetes, A-fib (on eliquis), CKD stage 3, Osteomyelitis of right first toe s/p transmetatarsal amputation, CHF (EF 25-30%), and CAD s/p CABG. Since admission was found to be in septic shockdue to UTI +/- Osteo, AKI-on-CKD, and Hyperkalemia. A Central line was placed. Patient was given 2L IVF bolus and started on broad spectrum antibiotics (Vanc and Zosyn) and vasopressors (Levophed and Dobutamine). [facility] [place] [facility] for continued care and consideration of dialysis. He is now being followed by Nephrology. He was started on CVVHD on 6/28 and came off on 7/5. BUN/CR continues to increase despite discontinuation of pressors. Palliative Medicine consulted for goals of care discussion.  Assessment: Patient asleep but  easily awakened. According to Nephrology, will continue with week limited HD trial. Most likely last HD will be tomorrow 7/17. Patient continues to not be a  candidate for long-term HD. Spoke to son and he is aware of current situation and plan. He plans to visit tomorrow and possibly today. We will discuss plans once HD is completed on tomorrow. Son verbalized awareness of options and states he most likely would want his father placed at residential hospice for end-of-life care once the decision is made to shift to comfort measures. He will continue discussion with patient's sisters as well. Patient continues to have poor po intake and urinary retention/output. Continues to have Oncologist for safety.   Recommendations/Plan:  DNR/DNI-at son's request  Continue to treat the treatable without escalation of care. Son is aware the plan is to continue week long trial of HD. Per Nephrology may receive HD on tomorrow 7/17. Plan to discuss  plans for comfort measures and disposition with son after HD tomorrow. Son has been in conversation with his aunts (patient's sisters) and states he would like him place at residential facility in [place] if possible. States he would like to proceed with HD tomorrow if Nephro is willing to proceed. Will continue to closely follow and support.   Palliative will continue to support patient, family, and medical team during hospitalization.   Goals of Care and Additional Recommendations:  Limitations on Scope of Treatment: Full Scope Treatment-continue to treat the treatable without escalation of care.   Code Status:    Code Status Orders  (From admission, onward)        Start     Ordered   01/12/18 1316  Do not attempt resuscitation (DNR)  Continuous    Question Answer Comment  In the event of cardiac or respiratory ARREST Do not call a "code blue"   In the event of cardiac or respiratory ARREST Do not perform Intubation, CPR, defibrillation or ACLS   In the  event of cardiac or respiratory ARREST Use medication by any route, position, wound care, and other measures to relive pain and suffering. May use oxygen, suction and manual treatment of airway obstruction as needed for comfort.      01/12/18 1315    Code Status History    Date Active Date Inactive Code Status Order ID Comments User Context   01/01/2018 1938 01/12/2018 1315 Full Code [id number]  Reyne Dumas, MD Inpatient       Prognosis:   < 6 months-in the setting of continuation of HD. If discontinuation of dialysis 2-3 weeks or less in the setting ofAKI,chronic kidney disease, poor p.o. intake, immobility, acute on chronic biventricular CHF, EF 25 -30%, altered mental status, shock, anemia, osteomyelitis status post amputation with poor wound healing, decubitus ulcers, moderate protein malnutrition, and poor functional status.  Discharge Planning:  To Be Determined Once HD has been d/c'ed son would like patient placed in residential hospice facility for end-of-life care. If continues with HD (which he is not a candidate for long-term per Nephrology) would need SNF placement with Palliative vs. Hospice.   Care plan was discussed with patient's son and bedside RN.   Thank you for allowing the Palliative Medicine Team to assist in the care of this patient.   Total Time 25 min.  Prolonged Time Billed NO       Greater than 50%  of this time was spent counseling and coordinating care related to the above assessment and plan.  Alda Lea, NP-BC Palliative Medicine Team  Phone: [phone] Fax: [phone] Pager: [phone] Amion: Bjorn Pippin   Please contact Palliative Medicine Team phone at [phone] for questions and concerns.

## 2018-01-19 NOTE — Plan of Care (Signed)
  Problem: Activity: Goal: Risk for activity intolerance will decrease Outcome: Not Progressing   Problem: Activity: Goal: Activity intolerance will improve Outcome: Not Progressing

## 2018-01-19 NOTE — Progress Notes (Signed)
[facility] ROUNDING NOTE   Subjective:   Interval History: has no complaint today. Patient was difficult to arose. As per nursing patient was awake and alert ~1 hour prior. Will reassess again this morning.   Objective:  Vital signs in last 24 hours:  Temp:  [97.4 F (36.3 C)-98.8 F (37.1 C)] 98.3 F (36.8 C) (07/16 0457) Pulse Rate:  [59-70] 69 (07/16 0457) Resp:  [16-20] 20 (07/16 0457) BP: (104-141)/(61-101) 108/61 (07/16 0457) SpO2:  [91 %-100 %] 99 % (07/16 0457) Weight:  [189 lb (85.7 kg)] 189 lb (85.7 kg) (07/16 0457)  Weight change: -3 lb 0.2 oz (-1.366 kg) Filed Weights   01/17/18 0526 01/17/18 2144 01/19/18 0457  Weight: 192 lb 0.3 oz (87.1 kg) 192 lb 0.2 oz (87.1 kg) 189 lb (85.7 kg)    Intake/Output: I/O last 3 completed shifts: In: 762 [P.O.:762] Out: 0    Intake/Output this shift:  No intake/output data recorded.  Physical Exam: General: In no acute distress, afebrile, nondiaphoretic CVS- RRR  RS- Crackles present bilaterally  ABD- BS present soft non-distended, nontender SKIN- turgor improved, may be hypervolemic today EXT- no edema bilaterally    Basic Metabolic Panel: Recent Labs  Lab 01/14/18 0611 01/15/18 0447 01/16/18 0347 01/17/18 0506 01/18/18 0211 01/19/18 0404  NA 131* 132*  132* 129*  129* 133*  134* 134* 134*  K 4.0 4.6  4.5 4.5  4.5 3.8  3.9 4.5 5.1  CL 91* 93*  93* 91*  91* 97*  98 97* 97*  CO2 21* 14*  17* 20*  19* 21*  22 22 20*  GLUCOSE 144* 93  95 181*  179* 172*  173* 154* 143*  BUN 44* 56*  57* 67*  68* 46*  45* 55* 68*  CREATININE 5.80* 6.92*  6.73* 7.83*  7.79* 5.64*  5.59* 6.59* 7.48*  CALCIUM 7.9* 8.0*  8.1* 8.0*  8.0* 7.9*  7.9* 8.1* 8.2*  MG 2.5* 2.7* 2.9* 2.6* 2.5*  --   PHOS 6.5* 8.1* 8.4* 6.2* 7.4* 8.9*    Liver Function Tests: Recent Labs  Lab 01/13/18 0433  01/15/18 0447 01/16/18 0347 01/17/18 0506 01/18/18 0211 01/19/18 0404  AST 16  --   --   --   --   --    --   ALT 20  --   --   --   --   --   --   ALKPHOS 57  --   --   --   --   --   --   BILITOT 1.6*  --   --   --   --   --   --   PROT 6.0*  --   --   --   --   --   --   ALBUMIN 2.2*  2.2*   < > 2.2* 2.1* 2.2* 2.2* 2.2*   < > = values in this interval not displayed.   No results for input(s): LIPASE, AMYLASE in the last 168 hours. No results for input(s): AMMONIA in the last 168 hours.  CBC: Recent Labs  Lab 01/13/18 0433 01/14/18 0611 01/15/18 0447 01/16/18 0347 01/17/18 0506 01/18/18 0211  WBC 6.0 6.3 6.1 6.7 6.3 6.8  NEUTROABS 4.5  --   --   --   --   --   HGB 7.6* 7.6* 8.1* 7.9* 8.0* 8.1*  HCT 24.9* 25.0* 27.2* 26.0* 25.5* 26.5*  MCV 100.8* 100.4* 103.4* 101.2* 100.8* 102.7*  PLT 91* 111*  116* 132* 132* 114*    Cardiac Enzymes: No results for input(s): CKTOTAL, CKMB, CKMBINDEX, TROPONINI in the last 168 hours.  BNP: Invalid input(s): POCBNP  CBG: Recent Labs  Lab 01/17/18 2117 01/18/18 0731 01/18/18 1210 01/18/18 1728 01/18/18 2148  GLUCAP 121* 155* 114* 147* 143*    Microbiology: Results for orders placed or performed during the hospital encounter of 01/01/18  MRSA PCR Screening     Status: None   Collection Time: 01/01/18  7:32 PM  Result Value Ref Range Status   MRSA by PCR NEGATIVE NEGATIVE Final    Comment:        The GeneXpert MRSA Assay (FDA approved for NASAL specimens only), is one component of a comprehensive MRSA colonization surveillance program. It is not intended to diagnose MRSA infection nor to guide or monitor treatment for MRSA infections. Performed at [facility], [place] [address], [place], [place] [address]   Culture, blood (routine x 2)     Status: None   Collection Time: 01/01/18  9:23 PM  Result Value Ref Range Status   Specimen Description BLOOD LEFT WRIST  Final   Special Requests   Final    BOTTLES DRAWN AEROBIC AND ANAEROBIC Blood Culture adequate volume   Culture   Final    NO GROWTH 5 DAYS Performed at [facility], [address] [place], [place] [address]    Report Status 01/06/2018 FINAL  Final  Culture, blood (routine x 2)     Status: None   Collection Time: 01/01/18  9:33 PM  Result Value Ref Range Status   Specimen Description BLOOD RIGHT WRIST  Final   Special Requests   Final    BOTTLES DRAWN AEROBIC ONLY Blood Culture results may not be optimal due to an inadequate volume of blood received in culture bottles   Culture   Final    NO GROWTH 5 DAYS Performed at [facility], [place] [address] [place],  [address]    Report Status 01/06/2018 FINAL  Final    Coagulation Studies: No results for input(s): LABPROT, INR in the last 72 hours.  Urinalysis: No results for input(s): COLORURINE, LABSPEC, PHURINE, GLUCOSEU, HGBUR, BILIRUBINUR, KETONESUR, PROTEINUR, UROBILINOGEN, NITRITE, LEUKOCYTESUR in the last 72 hours.  Invalid input(s): APPERANCEUR    Imaging: No results found.   Medications:   . sodium chloride 250 mL (01/11/18 2212)   . Chlorhexidine Gluconate Cloth  6 each Topical Daily  . collagenase   Topical BID  . feeding supplement (NEPRO CARB STEADY)  237 mL Oral BID BM  . insulin aspart  0-5 Units Subcutaneous QHS  . insulin aspart  0-9 Units Subcutaneous TID WC  . levothyroxine  100 mcg Oral QAC breakfast  . mouth rinse  15 mL Mouth Rinse BID  . QUEtiapine  50 mg Oral QHS  . sodium chloride flush  10-40 mL Intracatheter Q12H  . sodium chloride flush  10-40 mL Intracatheter Q12H   sodium chloride, acetaminophen, albuterol, Gerhardt's butt cream, haloperidol lactate, ondansetron (ZOFRAN) IV, sodium chloride flush, sodium chloride flush, traMADol  Assessment/ Plan:  Arlee Santosuosso is a 67 yo M w/ a PMHx notable for CAD s/p CABG, HFrEF of 25-30% w/ AICD placed, DMII, A-fib on Eliquis, CKD III and osteomyelitis of the right first toe s/p amputation who presented from [facility] w/ fatigue and anuria. Noted to be in septic shock 2/2 urinary  source w/ ARI on CKD and marked hyperkalemia. Tx w/  Vanc/Zosyn w/ pressors started. Plan was to attempt a week of HD and monitor for improvement where without such a palliative approach would then be taken.      Acute renal injury 2/2 urosepsis-One week trial of HD attempted w/ Last HD session 07/13, plan for one more early this week and if no improvement as per Dr.'s Deterding/Lin prior statement that HD would not benefit long term given his comorbidities. Cr & BUN both up today. Dialysis on 07/17  ANEMIA-Hgb stable at 8.1 on 07/15, continue to monitor as indicated  HTN/VOL- Patient is +778ml/24hrs with BP remaining on the soft side at (104-141)/(61-101).   ACCESS-Left femoral HD catheter site appears nonedematous, nonerythematous   LOS: 18 Kathi Ludwig, MD Internal Medicine Resident, PGY-2  Renal Attending: More somnolent this AM ? Medication related.  For HD Wednesday per plans. Erling Cruz, MD

## 2018-01-20 ENCOUNTER — Encounter: Payer: Medicare HMO | Admitting: Sports Medicine

## 2018-01-20 DIAGNOSIS — R451 Restlessness and agitation: Secondary | ICD-10-CM

## 2018-01-20 DIAGNOSIS — E0801 Diabetes mellitus due to underlying condition with hyperosmolarity with coma: Secondary | ICD-10-CM

## 2018-01-20 LAB — RENAL FUNCTION PANEL
ALBUMIN: 2.2 g/dL — AB (ref 3.5–5.0)
Anion gap: 18 — ABNORMAL HIGH (ref 5–15)
BUN: 76 mg/dL — AB (ref 8–23)
CALCIUM: 8 mg/dL — AB (ref 8.9–10.3)
CO2: 18 mmol/L — ABNORMAL LOW (ref 22–32)
Chloride: 97 mmol/L — ABNORMAL LOW (ref 98–111)
Creatinine, Ser: 8.06 mg/dL — ABNORMAL HIGH (ref 0.61–1.24)
GFR calc Af Amer: 7 mL/min — ABNORMAL LOW (ref >60)
GFR calc non Af Amer: 6 mL/min — ABNORMAL LOW (ref >60)
GLUCOSE: 189 mg/dL — AB (ref 70–99)
PHOSPHORUS: 10.4 mg/dL — AB (ref 2.5–4.6)
Potassium: 5.6 mmol/L — ABNORMAL HIGH (ref 3.5–5.1)
SODIUM: 133 mmol/L — AB (ref 135–145)

## 2018-01-20 LAB — CBC
HCT: 29.4 % — ABNORMAL LOW (ref 39.0–52.0)
Hemoglobin: 8.7 g/dL — ABNORMAL LOW (ref 13.0–17.0)
MCH: 30.6 pg (ref 26.0–34.0)
MCHC: 29.6 g/dL — AB (ref 30.0–36.0)
MCV: 103.5 fL — ABNORMAL HIGH (ref 78.0–100.0)
Platelets: 131 10*3/uL — ABNORMAL LOW (ref 150–400)
RBC: 2.84 MIL/uL — ABNORMAL LOW (ref 4.22–5.81)
RDW: 25.6 % — AB (ref 11.5–15.5)
WBC: 5.6 10*3/uL (ref 4.0–10.5)

## 2018-01-20 LAB — GLUCOSE, CAPILLARY
GLUCOSE-CAPILLARY: 174 mg/dL — AB (ref 70–99)
GLUCOSE-CAPILLARY: 142 mg/dL — AB (ref 70–99)
Glucose-Capillary: 102 mg/dL — ABNORMAL HIGH (ref 70–99)
Glucose-Capillary: 103 mg/dL — ABNORMAL HIGH (ref 70–99)

## 2018-01-20 MED ORDER — HEPARIN SODIUM (PORCINE) 1000 UNIT/ML DIALYSIS: 1000.0000 [IU] | INTRAMUSCULAR | Status: DC | PRN

## 2018-01-20 MED ORDER — SODIUM CHLORIDE 0.9 % IV SOLN: 100.0000 mL | INTRAVENOUS | Status: DC | PRN

## 2018-01-20 MED ORDER — LIDOCAINE HCL (PF) 1 % IJ SOLN: 5.0000 mL | INTRAMUSCULAR | Status: DC | PRN

## 2018-01-20 MED ORDER — PENTAFLUOROPROP-TETRAFLUOROETH EX AERO: 1.0000 "application " | INHALATION_SPRAY | CUTANEOUS | Status: DC | PRN

## 2018-01-20 MED ORDER — ALTEPLASE 2 MG IJ SOLR: 2.0000 mg | Freq: Once | INTRAMUSCULAR | Status: DC | PRN

## 2018-01-20 MED ORDER — LIDOCAINE-PRILOCAINE 2.5-2.5 % EX CREA: 1.0000 "application " | TOPICAL_CREAM | CUTANEOUS | Status: DC | PRN

## 2018-01-20 NOTE — Progress Notes (Signed)
Daily Progress Note   Patient Name: Jose Heath       Date: 01/20/2018 DOB: 1951-01-11  Age: 67 y.o. MRN#: [id number] Attending Physician: Jose Corning, MD Primary Care Physician: Patient, No Pcp Per Admit Date: 01/01/2018  Reason for Consultation/Follow-up: Establishing goals of care  Subjective: Patient is asleep in bed. He continues to be lethargic. Will awaken when name called but does not respond. No family is at the bedside. Patient received last HD treatment today per Nephrology.   Length of Stay: 19  Current Medications: Scheduled Meds:  . Chlorhexidine Gluconate Cloth  6 each Topical Daily  . Chlorhexidine Gluconate Cloth  6 each Topical Q0600  . collagenase   Topical BID  . feeding supplement (NEPRO CARB STEADY)  237 mL Oral BID BM  . insulin aspart  0-5 Units Subcutaneous QHS  . insulin aspart  0-9 Units Subcutaneous TID WC  . levothyroxine  100 mcg Oral QAC breakfast  . mouth rinse  15 mL Mouth Rinse BID  . QUEtiapine  50 mg Oral QHS  . sodium chloride flush  10-40 mL Intracatheter Q12H  . sodium chloride flush  10-40 mL Intracatheter Q12H    Continuous Infusions: . sodium chloride 250 mL (01/11/18 2212)  . sodium chloride    . sodium chloride      PRN Meds: sodium chloride, sodium chloride, sodium chloride, acetaminophen, albuterol, alteplase, Gerhardt's butt cream, haloperidol lactate, heparin, lidocaine (PF), lidocaine-prilocaine, ondansetron (ZOFRAN) IV, pentafluoroprop-tetrafluoroeth, sodium chloride flush, sodium chloride flush, traMADol  Physical Exam  Constitutional: Vital signs are normal. He appears lethargic. He has a sickly appearance.  Fragile and thin in appearance.   Cardiovascular: Normal rate, regular rhythm and normal heart sounds. Exam  reveals decreased pulses.  Pulmonary/Chest: Effort normal. He has decreased breath sounds.  Abdominal: Normal appearance and bowel sounds are normal.  Musculoskeletal:  Generalized weakness, bilateral boots   Neurological: He appears lethargic. He displays atrophy.  Skin: Skin is warm. Bruising noted.  Right foot dressing clean, dry, intact, scattered bruising,  Psychiatric:  dementia   Nursing note and vitals reviewed.           Vital Signs: BP (!) 105/59 (BP Location: Left Arm)   Pulse 69   Temp 98.9 F (37.2 C) (Oral)   Resp 16   Ht 5\' 9"  (  1.753 m)   Wt 86.9 kg (191 lb 8 oz)   SpO2 100%   BMI 28.28 kg/m  SpO2: SpO2: 100 % O2 Device: O2 Device: Nasal Cannula O2 Flow Rate: O2 Flow Rate (L/min): 2 L/min  Intake/output summary:   Intake/Output Summary (Last 24 hours) at 01/20/2018 1047 Last data filed at 01/19/2018 2200 Gross per 24 hour  Intake 350 ml  Output 0 ml  Net 350 ml   LBM: Last BM Date: 01/18/18 Baseline Weight: Weight: 98.9 kg (218 lb 0.6 oz) Most recent weight: Weight: 86.9 kg (191 lb 8 oz)       Palliative Assessment/Data: PPS 20%   Flowsheet Rows     Most Recent Value  Intake Tab  Referral Department  Hospitalist  Unit at Time of Referral  ICU  Palliative Care Primary Diagnosis  Nephrology  Date Notified  01/08/18  Palliative Care Type  New Palliative care  Reason for referral  Clarify Goals of Care  Date of Admission  01/01/18  Date first seen by Palliative Care  01/10/18  # of days Palliative referral response time  2 Day(s)  # of days IP prior to Palliative referral  7  Clinical Assessment  Psychosocial & Spiritual Assessment  Palliative Care Outcomes     Patient Active Problem List   Diagnosis Date Noted  . AKI (acute kidney injury) ([place]) 01/17/2018  . CKD (chronic kidney disease), stage III ([place]) 01/17/2018  . Malnutrition of moderate degree 01/07/2018  . Pressure injury of skin 01/02/2018  . Septic shock ([place]) 01/01/2018  .  Arthritis 09/17/2016  . Atrial flutter ([place]) 09/17/2016  . Coronary artery disease 09/17/2016  . Diabetes mellitus ([place]) 09/17/2016  . Disease of thyroid gland 09/17/2016  . HOH (hard of hearing) 09/17/2016  . Hyperlipidemia 09/17/2016  . ICD (implantable cardioverter-defibrillator) in place 09/17/2016  . Left foot pain 09/17/2016  . Skin cancer 09/17/2016  . TSH (thyroid-stimulating hormone deficiency) 08/13/2016  . Anasarca 07/30/2016  . Charcot foot due to diabetes mellitus ([place]) 02/13/2016  . Atypical atrial flutter ([place]) 10/04/2015  . Essential hypertension 10/04/2015  . Ischemic cardiomyopathy 10/04/2015  . Pure hypercholesterolemia 10/04/2015  . Amputated toe of left foot ([place]) 08/15/2015  . Diabetic peripheral neuropathy ([place]) 08/15/2015  . Type 2 diabetes mellitus with Charcot's joint of left foot ([place]) 08/15/2015  . Cardiac defibrillator in place 05/29/2015  . Cardiomyopathy ([place]) 05/29/2015  . Dual ICD (implantable cardioverter-defibrillator) in place 05/29/2015   Palliative Care Assessment & Plan   Patient Profile: 67 y.o.maleadmitted on 6[facility]28/[facility] with complaints of fatigue and no urine output x1day. He has a past medical history significant for AICD, diabetes, A-fib (on eliquis), CKD stage 3, Osteomyelitis of right first toe s/p transmetatarsal amputation, CHF (EF 25-30%), and CAD s/p CABG. Since admission was found to be in septic shockdue to UTI +/- Osteo, AKI-on-CKD, and Hyperkalemia. A Central line was placed. Patient was given 2L IVF bolus and started on broad spectrum antibiotics (Vanc and Zosyn) and vasopressors (Levophed and Dobutamine). [facility] [place] [facility] for continued care and consideration of dialysis. He is now being followed by Nephrology. He was started on CVVHD on 6/28 and came off on 7/5. BUN/CR continues to increase despite discontinuation of pressors. Palliative Medicine consulted for goals of care  discussion.  Assessment: Patient continues to be lethargic. Less arousal today versus yesterday. Per Nephrology the plan is to complete his last HD treatment on today. Continues to have very poor  intake. I have spoken in detail with the son and he would like for Mr. Jose Heath to transition to residential hospice tomorrow or once bed is available. He states it would be more convenient if patient is able to be placed at facility in or closest to [place]. We re-discussed the goals of hospice and the focus on symptom management and end-of-life care. Son verbalized understanding.   Recommendations/Plan:  DNR/DNI-at son's request  Continue to treat the treatable without escalation of care. Son verbalizes he would like patient transferred to residential hospice in the [place] area for end-of-life care.   CSW consult has been placed for disposition to hospice home.   At discharge would recommend the following:   continuation of Seroquel at bedtime if patient can tolerate.   Haldol 1mg  every 4-6hrs as needed for agitation/anxiety  Zofran 4mg  SL every 6hrs as needed for nausea/vomiting  Robinul 1mg  po every 4 hrs as needed for excessive secretions.  Tylenol 650 mg PO/PR every 6hrs as needed for mild pain/fever.  Dilaudid 0.5 mg every 2hrs as needed for severe pain/shortness of breath  Palliative will continue to support patient, family, and medical team during hospitalization.   Goals of Care and Additional Recommendations:  Limitations on Scope of Treatment: Full Scope Treatment-continue to treat the treatable without escalation of care.   Code Status:    Code Status Orders  (From admission, onward)        Start     Ordered   01/12/18 1316  Do not attempt resuscitation (DNR)  Continuous    Question Answer Comment  In the event of cardiac or respiratory ARREST Do not call a "code blue"   In the event of cardiac or respiratory ARREST Do not perform Intubation, CPR, defibrillation  or ACLS   In the event of cardiac or respiratory ARREST Use medication by any route, position, wound care, and other measures to relive pain and suffering. May use oxygen, suction and manual treatment of airway obstruction as needed for comfort.      01/12/18 1315    Code Status History    Date Active Date Inactive Code Status Order ID Comments User Context   01/01/2018 1938 01/12/2018 1315 Full Code [id number]  Reyne Dumas, MD Inpatient       Prognosis:   Days-Weeks- in the setting ofAKI,chronic kidney disease, discontinuation of dialysis, poor p.o. intake, immobility, acute on chronic biventricular CHF, EF 25 -30%, altered mental status, shock, anemia, osteomyelitis status post amputation with poor wound healing, decubitus ulcers, moderate protein malnutrition, and poor functional status.  Discharge Planning:  Hospice facility  Per son would like patient to be placed in facility closest to if not in [place], [place].   Care plan was discussed with patient's son, Dr. Tana Coast and bedside RN.   Thank you for allowing the Palliative Medicine Team to assist in the care of this patient.   Total Time 25 min.  Prolonged Time Billed NO       Greater than 50%  of this time was spent counseling and coordinating care related to the above assessment and plan.  Alda Lea, NP-BC Palliative Medicine Team  Phone: [phone] Fax: [phone] Pager: [phone] Amion: Bjorn Pippin   Please contact Palliative Medicine Team phone at [phone] for questions and concerns.

## 2018-01-20 NOTE — Clinical Social Work Note (Addendum)
Patient has his last dialysis treatment on today and is now comfort care. Son Jose Heath contacted [phone]) regarding a hospice facility and [facility] chosen. Facility contacted and talked with referral staff person Ramiro Harvest to make referral. Clinicals transmitted to facility and CSW will be contacted on 7/18 after clinicals reviewed by MD per [facility]. Son Jose Heath contacted and updated.  Jose Heath, MSW, LCSW Licensed Clinical Social Worker [place] [phone]

## 2018-01-20 NOTE — Procedures (Signed)
On hemodialysis with the last treatment planned. No current instability.  He is very lethargic and difficult to arouse.  Hgb8.7gm, K 5.6 We will sign off.  Erling Cruz, MD

## 2018-01-20 NOTE — Progress Notes (Signed)
[facility]                                                                              Patient Demographics  Jose Heath, is a 67 y.o. male, DOB - 07/23/1950, XHB:[id number]  Admit date - 01/01/2018   Admitting Physician Rush Farmer, MD  Outpatient Primary MD for the patient is Patient, No Pcp Per  Outpatient specialists:   LOS - 19  days   Medical records reviewed and are as summarized below:    No chief complaint on file.      Brief summary    Jose Heath is a 67 year old with a history of CAD status post CABG, systolic congestive heart failure ejection fraction 25-30% with AICD in place, diabetes mellitus type 2, osteomyelitis of the right first toe status post amputation, A. fib on Eliquis, CKD stage III came from [facility] with complaints of fatigue and no urine output. He was found to be in septic shock secondary to urinary tract infection complicated by osteomyelitis and AKI on CKD. He was also hyperkalemic. Central line was placed, 2 L bolus was given, broad-spectrum antibiotics-vancomycin and Zosyn and pressors were started. He was also started on CRRT with nephrology recommendation. He was in biventricular heart failure requiring dobutamine drip. He also required Precedex and Haldol given extreme agitation but was eventually transitioned to stepdown unit once he was stabilized and weaned off the drips. Due to patient's chronic comorbidities and poor quality of life, palliative care service was consulted who recommended hospice care. After prolonged discussion with nephrology team and palliative care service it was determined to try dialysis for about a week and then transition him to residential hospice if without significant improvement.    Assessment & Plan    Principal Problem:   AKI (acute kidney injury) ([place]) on CKD stage III -Secondary to sepsis, Bactrim, ARB, NSAIDs  - Nephrology was consulted and patient was  started on a brief trial of hemodialysis-, undergoing dialysis on 7/17 -Per nephrology, no current instability, very lethargic and difficult to arouse, no meaningful recovery, last treatment as planned -Palliative care is consulted, awaiting further recommendations for transitioning to residential hospice, comfort care  Active Problems: Acute respiratory failure/distress with hypoxia. Acute pulmonary edema with acute systolic CHF -Volume bolus managed with hemodialysis however no meaningful recovery -Patient had a 2D echo on 01/02/2018 which showed EF of 20 to 25% with diffuse hypokinesis.     Diabetes mellitus ([place]) -Continue sliding scale insulin    Septic shock ([place]) -Sepsis physiology currently resolved, had required pressors, broad-spectrum antibiotics with aggressive IV fluid hydration  - patient was followed by critical care.at the time of admission.      Pressure injury of skin, sacral decub ulcer, right medial foot, coccygeal unstageable pressure injury POA -Wound care consult appreciated    Malnutrition of moderate degree -Per dietitian, continue supplements  History of liver cirrhosis/ascites -Patient received hemodialysis for a week prior with no significant improvement  Delirium superimposed on baseline dementia Continue Seroquel   Code Status: DNR DVT Prophylaxis:  SCD's Family Communication: No family member at the bedside   Disposition Plan:  Awaiting palliative recommendations regarding comfort care at this time she will hospice  Time Spent in minutes  25 minutes  Procedures:  Hemodialysis  Consultants:   Pulmonary critical care Nephrology Palliative medicine Cardiology  Antimicrobials:      Medications  Scheduled Meds: . Chlorhexidine Gluconate Cloth  6 each Topical Daily  . Chlorhexidine Gluconate Cloth  6 each Topical Q0600  . collagenase   Topical BID  . feeding supplement (NEPRO CARB STEADY)  237 mL Oral BID BM  . insulin aspart  0-5  Units Subcutaneous QHS  . insulin aspart  0-9 Units Subcutaneous TID WC  . levothyroxine  100 mcg Oral QAC breakfast  . mouth rinse  15 mL Mouth Rinse BID  . QUEtiapine  50 mg Oral QHS  . sodium chloride flush  10-40 mL Intracatheter Q12H  . sodium chloride flush  10-40 mL Intracatheter Q12H   Continuous Infusions: . sodium chloride 250 mL (01/11/18 2212)  . sodium chloride    . sodium chloride     PRN Meds:.sodium chloride, sodium chloride, sodium chloride, acetaminophen, albuterol, alteplase, Gerhardt's butt cream, haloperidol lactate, heparin, lidocaine (PF), lidocaine-prilocaine, ondansetron (ZOFRAN) IV, pentafluoroprop-tetrafluoroeth, sodium chloride flush, sodium chloride flush, traMADol   Antibiotics   Anti-infectives (From admission, onward)   Start     Dose/Rate Route Frequency Ordered Stop   01/11/18 2200  piperacillin-tazobactam (ZOSYN) IVPB 3.375 g     3.375 g 12.5 mL/hr over 240 Minutes Intravenous Every 12 hours 01/11/18 1414 01/12/18 1200   01/08/18 1200  piperacillin-tazobactam (ZOSYN) IVPB 2.25 g  Status:  Discontinued     2.25 g 100 mL/hr over 30 Minutes Intravenous Every 6 hours 01/08/18 0856 01/11/18 1414   01/06/18 2000  vancomycin (VANCOCIN) IVPB 750 mg/150 ml premix  Status:  Discontinued     750 mg 150 mL/hr over 60 Minutes Intravenous Every 24 hours 01/06/18 1531 01/08/18 0856   01/02/18 1400  vancomycin (VANCOCIN) IVPB 1000 mg/200 mL premix  Status:  Discontinued     1,000 mg 200 mL/hr over 60 Minutes Intravenous Every 24 hours 01/01/18 2354 01/06/18 1531   01/02/18 0200  piperacillin-tazobactam (ZOSYN) IVPB 3.375 g  Status:  Discontinued     3.375 g 12.5 mL/hr over 240 Minutes Intravenous Every 12 hours 01/01/18 2049 01/01/18 2350   01/02/18 0200  piperacillin-tazobactam (ZOSYN) IVPB 3.375 g  Status:  Discontinued     3.375 g 100 mL/hr over 30 Minutes Intravenous Every 6 hours 01/01/18 2351 01/08/18 0856        Subjective:   Jose Heath was  seen and examined today.  Continues to remain sleepy, arousable difficult to comprehend.  No acute issues overnight. Objective:   Vitals:   01/20/18 0905 01/20/18 0930 01/20/18 1000 01/20/18 1030  BP: (!) 100/45 (!) 104/59 109/61 (!) 105/59  Pulse: 63 69 (!) 59 69  Resp: 17 16 17 16   Temp:      TempSrc:      SpO2: 100% 99% 99% 100%  Weight:      Height:        Intake/Output Summary (Last 24 hours) at 01/20/2018 1332 Last data filed at 01/19/2018 2200 Gross per 24 hour  Intake 350 ml  Output 0 ml  Net 350 ml     Wt Readings from Last 3 Encounters:  01/20/18 86.9 kg (191 lb 8 oz)     Exam  General: Lethargic but arousable  Eyes:   HEENT:  Atraumatic, normocephalic  Cardiovascular: S1 S2 auscultated Regular rate  and rhythm.  Respiratory: Decreased breath sound at the bases  Gastrointestinal: Soft, nontender, nondistended, + bowel sounds  Ext: no pedal edema bilaterally  Neuro: not cooperative  Musculoskeletal: No digital cyanosis, clubbing  Skin: No rashes  Psych: dementia   Data Reviewed:  I have personally reviewed following labs and imaging studies  Micro Results No results found for this or any previous visit (from the past 240 hour(s)).  Radiology Reports US Renal  Result Date: 01/01/2018 CLINICAL DATA:  67 year old male with acute renal insufficiency. EXAM: RENAL / URINARY TRACT ULTRASOUND COMPLETE COMPARISON:  None. FINDINGS: Evaluation is limited due to patient's body habitus. Right Kidney: Length: 10 cm. Mild parenchymal atrophy. No hydronephrosis or shadowing stone. Left Kidney: Length: 11 cm. The left kidney is poorly visualized. No definite hydronephrosis. Bladder: Partially contracted around a Foley catheter. There is ascites with irregular appearance of the liver likely cirrhosis. Clinical correlation is recommended. IMPRESSION: 1. Poorly visualized kidneys secondary to body habitus. No definite hydronephrosis. 2. Findings of cirrhosis as well as  a small ascites. Electronically Signed   By: Anner Crete M.D.   On: 01/01/2018 22:50   US Abdomen Limited  Result Date: 01/14/2018 CLINICAL DATA:  Ascites. EXAM: LIMITED ABDOMEN ULTRASOUND FOR ASCITES TECHNIQUE: Limited ultrasound survey for ascites was performed in all four abdominal quadrants. COMPARISON:  None. FINDINGS: A small right pleural effusion is noted. Four-quadrant assessment for ascites demonstrates a moderate to large volume of ascites in all 4 quadrants spanning at least 8-10 cm in depth. IMPRESSION: Ascites is noted in all 4 quadrants of the abdomen imaged. Small right pleural effusion is also noted. Electronically Signed   By: Ashley Royalty M.D.   On: 01/14/2018 22:39   Dg Chest Port 1 View  Result Date: 01/12/2018 CLINICAL DATA:  67 year old male with shortness breath. Subsequent encounter. EXAM: PORTABLE CHEST 1 VIEW COMPARISON:  01/11/2018 chest x-ray. FINDINGS: Right costophrenic angle not included on present exam. AICD in place with leads unchanged in position. Post CABG. Cardiomegaly. Right central line tip mid to distal superior vena cava. Slightly asymmetric diffuse airspace disease suggestive of pulmonary edema. No gross pneumothorax. No acute osseous abnormality. IMPRESSION: Similar appearance of diffuse slightly asymmetric airspace disease suggestive of pulmonary edema. Cardiomegaly with pacer in place.  Post CABG. Electronically Signed   By: Genia Del M.D.   On: 01/12/2018 08:40   Dg Chest Port 1 View  Result Date: 01/11/2018 CLINICAL DATA:  Respiratory failure EXAM: PORTABLE CHEST 1 VIEW COMPARISON:  01/10/2018 FINDINGS: AICD unchanged. CABG. Central venous catheter tip in the SVC unchanged Bilateral airspace disease with mild interval improvement. Small bilateral effusions unchanged. IMPRESSION: Improvement in bilateral airspace disease most likely pulmonary edema. Small pleural effusions. Electronically Signed   By: Franchot Gallo M.D.   On: 01/11/2018 07:27   Dg  Chest Port 1 View  Result Date: 01/10/2018 CLINICAL DATA:  Respiratory failure. EXAM: PORTABLE CHEST 1 VIEW COMPARISON:  01/08/2018. FINDINGS: Unchanged cardiac silhouette, and dual lead pacer. Central venous catheter device from RIGHT IJ approach lies at the cavoatrial junction. BILATERAL pulmonary opacities, likely edema, stable. IMPRESSION: Stable chest. Electronically Signed   By: Staci Righter M.D.   On: 01/10/2018 07:20   Dg Chest Port 1 View  Result Date: 01/08/2018 CLINICAL DATA:  Agitation.  Respiratory failure. EXAM: PORTABLE CHEST 1 VIEW COMPARISON:  01/06/2018. FINDINGS: Right IJ line stable position. Cardiac pacer in stable position. Prior CABG. Cardiomegaly with diffuse bilateral pulmonary infiltrates/edema and small bilateral pleural effusions. No  significant interim change. IMPRESSION: 1.  Right IJ line in stable position. 2. Cardiac pacer in stable position. Prior CABG. Cardiomegaly with diffuse bilateral pulmonary infiltrates/edema and small bilateral pleural effusions. No significant interim change. Electronically Signed   By: Marcello Moores  Register   On: 01/08/2018 05:59   Dg Chest Port 1 View  Result Date: 01/06/2018 CLINICAL DATA:  Acute respiratory failure. EXAM: PORTABLE CHEST 1 VIEW COMPARISON:  Radiograph of January 05, 2018. FINDINGS: Stable cardiomegaly. Status post coronary artery bypass graft. Left-sided pacemaker is unchanged in position. Stable right internal jugular catheter. No pneumothorax is noted. Stable bilateral lung opacities are noted most concerning for pulmonary edema or possibly pneumonia. Bony thorax is unremarkable. IMPRESSION: Stable cardiomegaly and bilateral lung opacities as described above. Electronically Signed   By: Marijo Conception, M.D.   On: 01/06/2018 07:39   Dg Chest Port 1 View  Result Date: 01/05/2018 CLINICAL DATA:  Acute respiratory failure.  Shortness of breath. EXAM: PORTABLE CHEST 1 VIEW COMPARISON:  01/03/2018 FINDINGS: Previous median sternotomy and  CABG. Right internal jugular central line tip in the SVC 3 cm above the right atrium. Pacemaker/AICD remains in place. Interstitial and alveolar edema persists, with more focal density in the lower lungs that could represent resistant. No measurable effusion is seen. IMPRESSION: Persistent findings of congestive heart failure. More pronounced density in the lower lobes could also represent coexistent pneumonia. Electronically Signed   By: Nelson Chimes M.D.   On: 01/05/2018 07:01   Dg Chest Portable 1 View  Result Date: 01/03/2018 CLINICAL DATA:  Pulmonary edema EXAM: PORTABLE CHEST 1 VIEW COMPARISON:  Chest radiograph 01/02/2018 FINDINGS: Right IJ central venous catheter tip projects over superior vena cava. AST leads project over the heart, unchanged. Stable cardiomegaly. Low lung volumes. Similar-appearing diffuse bilateral interstitial pulmonary opacities with more patchy consolidation within the right mid lower lung. Moderate right and small left pleural effusions. No pneumothorax. Remote right rib fracture. IMPRESSION: Interval increase in right mid lower lung consolidation with persistent bilateral interstitial opacities suggestive of worsening pulmonary edema. Moderate right and small left pleural effusions. Electronically Signed   By: Lovey Newcomer M.D.   On: 01/03/2018 07:13   Dg Chest Port 1 View  Result Date: 01/02/2018 CLINICAL DATA:  Central line placement attempt. EXAM: PORTABLE CHEST 1 VIEW COMPARISON:  Chest radiograph 01/01/2018 FINDINGS: Unchanged position of left chest wall AICD leads. There is a right internal jugular vein approach central venous catheter with tip in the lower SVC. There is no pneumothorax. Unchanged small pleural effusions with associated atelectasis. Cardiomegaly with remote median sternotomy and CABG. IMPRESSION: No pneumothorax. Unchanged appearance of the chest with small pleural effusions. Electronically Signed   By: Ulyses Jarred M.D.   On: 01/02/2018 02:06   Dg  Chest Port 1 View  Result Date: 01/01/2018 CLINICAL DATA:  Acute respiratory failure EXAM: PORTABLE CHEST 1 VIEW COMPARISON:  None. FINDINGS: A right central line terminates in the central SVC. A dual lead AICD device is identified. Mild cardiomegaly. The hila and mediastinum are unremarkable. No pneumothorax. Interstitial opacities on the right greater than left. No focal infiltrate. IMPRESSION: 1. Cardiomegaly. 2. Right greater than left primarily interstitial opacities suggest the possibility of asymmetric edema. Developing pneumonia on the right is not completely excluded. Recommend clinical correlation and attention on follow-up. Electronically Signed   By: Dorise Bullion III M.D   On: 01/01/2018 20:21    Lab Data:  CBC: Recent Labs  Lab 01/15/18 9833 01/16/18 8250 01/17/18 5397  01/18/18 0211 01/20/18 0427  WBC 6.1 6.7 6.3 6.8 5.6  HGB 8.1* 7.9* 8.0* 8.1* 8.7*  HCT 27.2* 26.0* 25.5* 26.5* 29.4*  MCV 103.4* 101.2* 100.8* 102.7* 103.5*  PLT 116* 132* 132* 114* 161*   Basic Metabolic Panel: Recent Labs  Lab 01/14/18 0611 01/15/18 0447 01/16/18 0347 01/17/18 0506 01/18/18 0211 01/19/18 0404 01/20/18 0427  NA 131* 132*  132* 129*  129* 133*  134* 134* 134* 133*  K 4.0 4.6  4.5 4.5  4.5 3.8  3.9 4.5 5.1 5.6*  CL 91* 93*  93* 91*  91* 97*  98 97* 97* 97*  CO2 21* 14*  17* 20*  19* 21*  22 22 20* 18*  GLUCOSE 144* 93  95 181*  179* 172*  173* 154* 143* 189*  BUN 44* 56*  57* 67*  68* 46*  45* 55* 68* 76*  CREATININE 5.80* 6.92*  6.73* 7.83*  7.79* 5.64*  5.59* 6.59* 7.48* 8.06*  CALCIUM 7.9* 8.0*  8.1* 8.0*  8.0* 7.9*  7.9* 8.1* 8.2* 8.0*  MG 2.5* 2.7* 2.9* 2.6* 2.5*  --   --   PHOS 6.5* 8.1* 8.4* 6.2* 7.4* 8.9* 10.4*   GFR: Estimated Creatinine Clearance: 9.8 mL/min (A) (by C-G formula based on SCr of 8.06 mg/dL (H)). Liver Function Tests: Recent Labs  Lab 01/16/18 0347 01/17/18 0506 01/18/18 0211 01/19/18 0404 01/20/18 0427  ALBUMIN 2.1*  2.2* 2.2* 2.2* 2.2*   No results for input(s): LIPASE, AMYLASE in the last 168 hours. No results for input(s): AMMONIA in the last 168 hours. Coagulation Profile: No results for input(s): INR, PROTIME in the last 168 hours. Cardiac Enzymes: No results for input(s): CKTOTAL, CKMB, CKMBINDEX, TROPONINI in the last 168 hours. BNP (last 3 results) No results for input(s): PROBNP in the last 8760 hours. HbA1C: No results for input(s): HGBA1C in the last 72 hours. CBG: Recent Labs  Lab 01/19/18 0747 01/19/18 1124 01/19/18 1642 01/19/18 2152 01/20/18 0731  GLUCAP 138* 126* 115* 182* 174*   Lipid Profile: No results for input(s): CHOL, HDL, LDLCALC, TRIG, CHOLHDL, LDLDIRECT in the last 72 hours. Thyroid Function Tests: No results for input(s): TSH, T4TOTAL, FREET4, T3FREE, THYROIDAB in the last 72 hours. Anemia Panel: No results for input(s): VITAMINB12, FOLATE, FERRITIN, TIBC, IRON, RETICCTPCT in the last 72 hours. Urine analysis:    Component Value Date/Time   COLORURINE AMBER (A) 01/02/2018 0431   APPEARANCEUR TURBID (A) 01/02/2018 0431   LABSPEC 1.020 01/02/2018 0431   PHURINE 5.0 01/02/2018 0431   GLUCOSEU NEGATIVE 01/02/2018 0431   HGBUR LARGE (A) 01/02/2018 0431   BILIRUBINUR SMALL (A) 01/02/2018 0431   KETONESUR NEGATIVE 01/02/2018 0431   PROTEINUR 30 (A) 01/02/2018 0431   NITRITE NEGATIVE 01/02/2018 0431   LEUKOCYTESUR MODERATE (A) 01/02/2018 0431     Kellis Mcadam M.D. [facility] 01/20/2018, 1:32 PM  Pager: [phone] Between 7am to 7pm - call Pager - [phone]  After 7pm go to [email or web address] - password [email or web address]  Call night coverage person covering after 7pm

## 2018-01-20 NOTE — Progress Notes (Addendum)
[facility] ROUNDING NOTE   Subjective:   Interval History: has no complaint today. Patient is less somnolent today. He is oriented but continue to "feel" tired.  Objective:  Vital signs in last 24 hours:  Temp:  [97.7 F (36.5 C)-98.4 F (36.9 C)] 98.3 F (36.8 C) (07/17 0737) Pulse Rate:  [60-72] 70 (07/17 0737) Resp:  [17-20] 18 (07/17 0737) BP: (91-100)/(54-59) 98/54 (07/17 0737) SpO2:  [96 %-100 %] 100 % (07/17 0737) Weight:  [189 lb (85.7 kg)] 189 lb (85.7 kg) (07/16 2152)  Weight change: 0 lb (0 kg) Filed Weights   01/17/18 2144 01/19/18 0457 01/19/18 2152  Weight: 192 lb 0.2 oz (87.1 kg) 189 lb (85.7 kg) 189 lb (85.7 kg)    Intake/Output: I/O last 3 completed shifts: In: 350 [P.O.:350] Out: 0    Intake/Output this shift:  No intake/output data recorded.  Physical Exam: General: O x 3, in no acute distress, afebrile.  CVS- RRR, Systolic murmur RS- CTA bilaterally but in the prone position due to dialysis ABD- BS present soft non-distended EXT- no edema   Basic Metabolic Panel: Recent Labs  Lab 01/14/18 0611 01/15/18 0447 01/16/18 0347 01/17/18 0506 01/18/18 0211 01/19/18 0404 01/20/18 0427  NA 131* 132*  132* 129*  129* 133*  134* 134* 134* 133*  K 4.0 4.6  4.5 4.5  4.5 3.8  3.9 4.5 5.1 5.6*  CL 91* 93*  93* 91*  91* 97*  98 97* 97* 97*  CO2 21* 14*  17* 20*  19* 21*  22 22 20* 18*  GLUCOSE 144* 93  95 181*  179* 172*  173* 154* 143* 189*  BUN 44* 56*  57* 67*  68* 46*  45* 55* 68* 76*  CREATININE 5.80* 6.92*  6.73* 7.83*  7.79* 5.64*  5.59* 6.59* 7.48* 8.06*  CALCIUM 7.9* 8.0*  8.1* 8.0*  8.0* 7.9*  7.9* 8.1* 8.2* 8.0*  MG 2.5* 2.7* 2.9* 2.6* 2.5*  --   --   PHOS 6.5* 8.1* 8.4* 6.2* 7.4* 8.9* 10.4*    Liver Function Tests: Recent Labs  Lab 01/16/18 0347 01/17/18 0506 01/18/18 0211 01/19/18 0404 01/20/18 0427  ALBUMIN 2.1* 2.2* 2.2* 2.2* 2.2*   No results for input(s): LIPASE, AMYLASE in the last  168 hours. No results for input(s): AMMONIA in the last 168 hours.  CBC: Recent Labs  Lab 01/15/18 0447 01/16/18 0347 01/17/18 0506 01/18/18 0211 01/20/18 0427  WBC 6.1 6.7 6.3 6.8 5.6  HGB 8.1* 7.9* 8.0* 8.1* 8.7*  HCT 27.2* 26.0* 25.5* 26.5* 29.4*  MCV 103.4* 101.2* 100.8* 102.7* 103.5*  PLT 116* 132* 132* 114* 131*    Cardiac Enzymes: No results for input(s): CKTOTAL, CKMB, CKMBINDEX, TROPONINI in the last 168 hours.  BNP: Invalid input(s): POCBNP  CBG: Recent Labs  Lab 01/19/18 0747 01/19/18 1124 01/19/18 1642 01/19/18 2152 01/20/18 0731  GLUCAP 138* 126* 115* 182* 174*    Microbiology: Results for orders placed or performed during the hospital encounter of 01/01/18  MRSA PCR Screening     Status: None   Collection Time: 01/01/18  7:32 PM  Result Value Ref Range Status   MRSA by PCR NEGATIVE NEGATIVE Final    Comment:        The GeneXpert MRSA Assay (FDA approved for NASAL specimens only), is one component of a comprehensive MRSA colonization surveillance program. It is not intended to diagnose MRSA infection nor to guide or monitor treatment for MRSA infections. Performed at [facility], [place] [address], [place], [place] [address]   Culture, blood (routine x 2)     Status: None   Collection Time: 01/01/18  9:23 PM  Result Value Ref Range Status   Specimen Description BLOOD LEFT WRIST  Final   Special Requests   Final    BOTTLES DRAWN AEROBIC AND ANAEROBIC Blood Culture adequate volume   Culture   Final    NO GROWTH 5 DAYS Performed at [facility], [address] [place], [place] [address]    Report Status 01/06/2018 FINAL  Final  Culture, blood (routine x 2)     Status: None   Collection Time: 01/01/18  9:33 PM  Result Value Ref Range Status   Specimen Description BLOOD RIGHT WRIST  Final   Special Requests   Final    BOTTLES DRAWN AEROBIC ONLY Blood Culture results may not be optimal due to an inadequate volume of blood received  in culture bottles   Culture   Final    NO GROWTH 5 DAYS Performed at [facility], [place] [address] [place], [place] [address]    Report Status 01/06/2018 FINAL  Final    Coagulation Studies: No results for input(s): LABPROT, INR in the last 72 hours.  Urinalysis: No results for input(s): COLORURINE, LABSPEC, PHURINE, GLUCOSEU, HGBUR, BILIRUBINUR, KETONESUR, PROTEINUR, UROBILINOGEN, NITRITE, LEUKOCYTESUR in the last 72 hours.  Invalid input(s): APPERANCEUR    Imaging: No results found.   Medications:   . sodium chloride 250 mL (01/11/18 2212)   . Chlorhexidine Gluconate Cloth  6 each Topical Daily  . Chlorhexidine Gluconate Cloth  6 each Topical Q0600  . collagenase   Topical BID  . feeding supplement (NEPRO CARB STEADY)  237 mL Oral BID BM  . insulin aspart  0-5 Units Subcutaneous QHS  . insulin aspart  0-9 Units Subcutaneous TID WC  . levothyroxine  100 mcg Oral QAC breakfast  . mouth rinse  15 mL Mouth Rinse BID  . QUEtiapine  50 mg Oral QHS  . sodium chloride flush  10-40 mL Intracatheter Q12H  . sodium chloride flush  10-40 mL Intracatheter Q12H   sodium chloride, acetaminophen, albuterol, Gerhardt's butt cream, haloperidol lactate, ondansetron (ZOFRAN) IV, sodium chloride flush, sodium chloride flush, traMADol  Assessment/ Plan:  Jose Heath is a 67 yo M w/ a PMHx notable for CAD s/p CABG, HFrEF of 25-30% w/ AICD placed, DMII, A-fib on Eliquis, CKD III and osteomyelitis of the right first toe s/p amputation who presented from [facility] w/ fatigue and anuria. Noted to be in septic shock 2/2 urinary source w/ ARI on CKD and marked hyperkalemia. Tx w/ Vanc/Zosyn w/ pressors started. Plan was to attempt a week of HD and monitor for improvement where without such a palliative approach would then be taken.   Acute renal injury 2/2 urosepsis, NSAIDS, ARB multifactorial-One week trial of HD attempted w/HDtoday. If no improvementas perDr.'s  Deterding/Linprior statementthat HD would not benefit long term given his comorbidities.Cr & BUN both up again today. Dialysis on 07/17.  ANEMIA-Hgb stable at 8.7 on 07/17, continue to monitor as indicated  HTN/VOL-Patient is +357ml/24hrs with BP remaining on the soft side at.  ACCESS-Left femoral HD catheter site appearsnonedematous, nonerythematous   LOS: [place], MD Internal MedicineResident, PGY-2

## 2018-01-21 DIAGNOSIS — R188 Other ascites: Secondary | ICD-10-CM

## 2018-01-21 LAB — GLUCOSE, CAPILLARY
GLUCOSE-CAPILLARY: 144 mg/dL — AB (ref 70–99)
Glucose-Capillary: 154 mg/dL — ABNORMAL HIGH (ref 70–99)

## 2018-01-21 MED ORDER — GERHARDT'S BUTT CREAM: 1.0000 "application " | TOPICAL_CREAM | CUTANEOUS | Status: AC | PRN

## 2018-01-21 MED ORDER — TRAMADOL HCL 50 MG PO TABS: 50.0000 mg | ORAL_TABLET | Freq: Three times a day (TID) | ORAL | 0 refills | Status: AC | PRN

## 2018-01-21 MED ORDER — QUETIAPINE FUMARATE 50 MG PO TABS: 50.0000 mg | ORAL_TABLET | Freq: Every day | ORAL | Status: AC

## 2018-01-21 MED ORDER — INSULIN ASPART 100 UNIT/ML ~~LOC~~ SOLN: 0.0000 [IU] | Freq: Three times a day (TID) | SUBCUTANEOUS | 11 refills | Status: AC

## 2018-01-21 MED ORDER — COLLAGENASE 250 UNIT/GM EX OINT: TOPICAL_OINTMENT | Freq: Two times a day (BID) | CUTANEOUS | 0 refills | Status: AC

## 2018-01-21 NOTE — Discharge Summary (Signed)
Physician Discharge Summary   Patient ID: Jose Heath MRN: [id number] DOB/AGE: 1951/04/09 67 y.o.  Admit date: 01/01/2018 Discharge date: 01/21/2018  Primary Care Physician:  Patient, No Pcp Per   Recommendations for Outpatient Follow-up:  1. Follow up with PCP in 1-2 weeks or as needed  Home Health: Patient is being discharged to residential hospice Equipment/Devices: None  Discharge Condition: Guarded, prognosis less than 6 months  CODE STATUS:  DNR   Diet recommendation: Heart healthy diet   Discharge Diagnoses:    Acute on chronic CHF with related end stage renal failure and CAD  Acute kidney injury on CKD stage III, now progressed to ESRD Acute respiratory failure/distress with hypoxia Acute pulmonary edema with acute systolic CHF Diabetes mellitus type 2 . Septic shock (HCC) Pressure injury of the skin, sacral decub, right medial foot, coccygeal POA Moderate malnutrition History of liver cirrhosis/ascites Delirium superimposed on baseline dementia     Consults:   Nephrology Pulmonary critical care Palliative medicine Cardiology   Allergies:  No Known Allergies   DISCHARGE MEDICATIONS: Allergies as of 01/21/2018   No Known Allergies     Medication List    STOP taking these medications   acetaminophen-codeine 300-30 MG tablet Commonly known as:  TYLENOL #3   amiodarone 200 MG tablet Commonly known as:  PACERONE   apixaban 5 MG Tabs tablet Commonly known as:  ELIQUIS   bethanechol 25 MG tablet Commonly known as:  URECHOLINE   carvedilol 3.125 MG tablet Commonly known as:  COREG   furosemide 40 MG tablet Commonly known as:  LASIX   INVANZ 1 g injection Generic drug:  ertapenem   IRON PO   metFORMIN 500 MG tablet Commonly known as:  GLUCOPHAGE   NONFORMULARY OR COMPOUNDED ITEM   silodosin 8 MG Caps capsule Commonly known as:  RAPAFLO     TAKE these medications   acetaminophen 500 MG tablet Commonly known as:  TYLENOL Take  500 mg by mouth every 6 (six) hours as needed.   albuterol 108 (90 Base) MCG/ACT inhaler Commonly known as:  PROVENTIL HFA;VENTOLIN HFA Inhale 1-2 puffs into the lungs every 4 (four) hours as needed for shortness of breath.   collagenase ointment Commonly known as:  SANTYL Apply topically 2 (two) times daily. Apply to coccygeal Unstageable ulceration twice daily and PRN soiling.   Gerhardt's butt cream Crea Apply 1 application topically every 2 (two) hours as needed for irritation.   insulin aspart 100 UNIT/ML injection Commonly known as:  novoLOG Inject 0-9 Units into the skin 3 (three) times daily with meals. Sliding scale CBG 70 - 120: 0 units CBG 121 - 150: 1 unit,  CBG 151 - 200: 2 units,  CBG 201 - 250: 3 units,  CBG 251 - 300: 5 units,  CBG 301 - 350: 7 units,  CBG 351 - 400: 9 units   CBG > 400: 9 units and notify your MD   levothyroxine 100 MCG tablet Commonly known as:  SYNTHROID, LEVOTHROID Take 100 mcg by mouth every morning.   QUEtiapine 50 MG tablet Commonly known as:  SEROQUEL Take 1 tablet (50 mg total) by mouth at bedtime.   QVAR REDIHALER 40 MCG/ACT inhaler Generic drug:  beclomethasone Inhale 2 puffs into the lungs 2 (two) times daily.   traMADol 50 MG tablet Commonly known as:  ULTRAM Take 1 tablet (50 mg total) by mouth every 8 (eight) hours as needed for moderate pain.        Brief H  and P: For complete details please refer to admission H and P, but in brief  Jose Heath a 67 year old with a history of CAD status post CABG, systolic congestive heart failure ejection fraction 25-30% with AICD in place, diabetes mellitus type 2, osteomyelitis of the right first toe status post amputation, A. fib on Eliquis, CKD stage III came from [facility] with complaints of fatigue and no urine output. He was found to be in septic shock secondary to urinary tract infection complicated by osteomyelitis and AKI on CKD. He was also hyperkalemic. Central  line was placed, 2 L bolus was given, broad-spectrum antibiotics-vancomycin and Zosyn and pressors were started. He was also started on CRRT with nephrology recommendation. He was in biventricular heart failure requiring dobutamine drip. He also required Precedex and Haldol given extreme agitation but was eventually transitioned to stepdown unit once he was stabilized and weaned off the drips. Due to patient's chronic comorbidities and poor quality of life, palliative care service was consulted who recommended hospice care. After prolonged discussion with nephrology team and palliative care service it was determined to try dialysis for about a week and then transition him to residential hospice if without significant improvement.     Hospital Course:  AKI (acute kidney injury) ([place]) on CKD stage III, now progressed to end stage  -Secondary to sepsis, Bactrim, ARB, NSAIDs  - Nephrology was consulted and patient was started on a brief trial of hemodialysis, patient underwent last dialysis on 7/17. -Per nephrology, no current instability, very lethargic and difficult to arouse, no meaningful recovery with trial of hemodialysis for a week. -Palliative care was consulted.  At this time, patient's family requested comfort care and residential hospice.    Acute respiratory failure/distress with hypoxia. Acute pulmonary edema with acute on chronic systolic CHF, related to renal failure and CAD -Volume was managed with hemodialysis however no meaningful recovery with trial of hemodialysis -Patient had a 2D echo on 01/02/2018 which showed EF of 20 to 25% with diffuse hypokinesis.     Diabetes mellitus ([place]) -Discontinued metformin, for now continue sliding scale insulin, sensitive    Septic shock ([place]) -Sepsis physiology currently resolved, had required pressors, broad-spectrum antibiotics with aggressive IV fluid hydration  - patient was followed by critical care.at the time of admission.       Pressure injury of skin, sacral decub ulcer, right medial foot, coccygeal unstageable pressure injury POA -Wound care consult appreciated, continue wound care    Malnutrition of moderate degree -Per dietitian, continue supplements  History of liver cirrhosis/ascites -Patient received hemodialysis for a week prior with no significant improvement  Delirium superimposed on baseline dementia Continue Seroquel  Day of Discharge S: Quite alert and present today, hoping to go home.  No acute issues overnight.  BP 105/67 (BP Location: Right Arm)   Pulse (!) 59   Temp 98.1 F (36.7 C)   Resp 18   Ht 5\' 9"  (1.753 m)   Wt 86.2 kg (190 lb)   SpO2 100%   BMI 28.06 kg/m   Physical Exam: General: Alert and awake oriented x3 not in any acute distress.,  Appears comfortable HEENT: anicteric sclera, pupils reactive to light and accommodation CVS: S1-S2 clear no murmur rubs or gallops Chest: clear to auscultation bilaterally, no wheezing rales or rhonchi Abdomen: soft nontender, nondistended, normal bowel sounds Extremities: no cyanosis, clubbing or edema noted bilaterally    The results of significant diagnostics from this hospitalization (including imaging, microbiology, ancillary and laboratory) are  listed below for reference.      Procedures/Studies:  US Renal  Result Date: 02-Jan-2018 CLINICAL DATA:  67 year old male with acute renal insufficiency. EXAM: RENAL / URINARY TRACT ULTRASOUND COMPLETE COMPARISON:  None. FINDINGS: Evaluation is limited due to patient's body habitus. Right Kidney: Length: 10 cm. Mild parenchymal atrophy. No hydronephrosis or shadowing stone. Left Kidney: Length: 11 cm. The left kidney is poorly visualized. No definite hydronephrosis. Bladder: Partially contracted around a Foley catheter. There is ascites with irregular appearance of the liver likely cirrhosis. Clinical correlation is recommended. IMPRESSION: 1. Poorly visualized kidneys secondary to body  habitus. No definite hydronephrosis. 2. Findings of cirrhosis as well as a small ascites. Electronically Signed   By: Anner Crete M.D.   On: 01-02-2018 22:50   US Abdomen Limited  Result Date: 01/14/2018 CLINICAL DATA:  Ascites. EXAM: LIMITED ABDOMEN ULTRASOUND FOR ASCITES TECHNIQUE: Limited ultrasound survey for ascites was performed in all four abdominal quadrants. COMPARISON:  None. FINDINGS: A small right pleural effusion is noted. Four-quadrant assessment for ascites demonstrates a moderate to large volume of ascites in all 4 quadrants spanning at least 8-10 cm in depth. IMPRESSION: Ascites is noted in all 4 quadrants of the abdomen imaged. Small right pleural effusion is also noted. Electronically Signed   By: Ashley Royalty M.D.   On: 01/14/2018 22:39   Dg Chest Port 1 View  Result Date: 01/12/2018 CLINICAL DATA:  67 year old male with shortness breath. Subsequent encounter. EXAM: PORTABLE CHEST 1 VIEW COMPARISON:  01/11/2018 chest x-ray. FINDINGS: Right costophrenic angle not included on present exam. AICD in place with leads unchanged in position. Post CABG. Cardiomegaly. Right central line tip mid to distal superior vena cava. Slightly asymmetric diffuse airspace disease suggestive of pulmonary edema. No gross pneumothorax. No acute osseous abnormality. IMPRESSION: Similar appearance of diffuse slightly asymmetric airspace disease suggestive of pulmonary edema. Cardiomegaly with pacer in place.  Post CABG. Electronically Signed   By: Genia Del M.D.   On: 01/12/2018 08:40   Dg Chest Port 1 View  Result Date: 01/11/2018 CLINICAL DATA:  Respiratory failure EXAM: PORTABLE CHEST 1 VIEW COMPARISON:  01/10/2018 FINDINGS: AICD unchanged. CABG. Central venous catheter tip in the SVC unchanged Bilateral airspace disease with mild interval improvement. Small bilateral effusions unchanged. IMPRESSION: Improvement in bilateral airspace disease most likely pulmonary edema. Small pleural effusions.  Electronically Signed   By: Franchot Gallo M.D.   On: 01/11/2018 07:27   Dg Chest Port 1 View  Result Date: 01/10/2018 CLINICAL DATA:  Respiratory failure. EXAM: PORTABLE CHEST 1 VIEW COMPARISON:  01/08/2018. FINDINGS: Unchanged cardiac silhouette, and dual lead pacer. Central venous catheter device from RIGHT IJ approach lies at the cavoatrial junction. BILATERAL pulmonary opacities, likely edema, stable. IMPRESSION: Stable chest. Electronically Signed   By: Staci Righter M.D.   On: 01/10/2018 07:20   Dg Chest Port 1 View  Result Date: 01/08/2018 CLINICAL DATA:  Agitation.  Respiratory failure. EXAM: PORTABLE CHEST 1 VIEW COMPARISON:  01/06/2018. FINDINGS: Right IJ line stable position. Cardiac pacer in stable position. Prior CABG. Cardiomegaly with diffuse bilateral pulmonary infiltrates/edema and small bilateral pleural effusions. No significant interim change. IMPRESSION: 1.  Right IJ line in stable position. 2. Cardiac pacer in stable position. Prior CABG. Cardiomegaly with diffuse bilateral pulmonary infiltrates/edema and small bilateral pleural effusions. No significant interim change. Electronically Signed   By: Marcello Moores  Register   On: 01/08/2018 05:59   Dg Chest Port 1 View  Result Date: 01/06/2018 CLINICAL DATA:  Acute respiratory  failure. EXAM: PORTABLE CHEST 1 VIEW COMPARISON:  Radiograph of January 05, 2018. FINDINGS: Stable cardiomegaly. Status post coronary artery bypass graft. Left-sided pacemaker is unchanged in position. Stable right internal jugular catheter. No pneumothorax is noted. Stable bilateral lung opacities are noted most concerning for pulmonary edema or possibly pneumonia. Bony thorax is unremarkable. IMPRESSION: Stable cardiomegaly and bilateral lung opacities as described above. Electronically Signed   By: Marijo Conception, M.D.   On: 01/06/2018 07:39   Dg Chest Port 1 View  Result Date: 01/05/2018 CLINICAL DATA:  Acute respiratory failure.  Shortness of breath. EXAM: PORTABLE  CHEST 1 VIEW COMPARISON:  01/03/2018 FINDINGS: Previous median sternotomy and CABG. Right internal jugular central line tip in the SVC 3 cm above the right atrium. Pacemaker/AICD remains in place. Interstitial and alveolar edema persists, with more focal density in the lower lungs that could represent resistant. No measurable effusion is seen. IMPRESSION: Persistent findings of congestive heart failure. More pronounced density in the lower lobes could also represent coexistent pneumonia. Electronically Signed   By: Nelson Chimes M.D.   On: 01/05/2018 07:01   Dg Chest Portable 1 View  Result Date: 01/03/2018 CLINICAL DATA:  Pulmonary edema EXAM: PORTABLE CHEST 1 VIEW COMPARISON:  Chest radiograph 01/02/2018 FINDINGS: Right IJ central venous catheter tip projects over superior vena cava. AST leads project over the heart, unchanged. Stable cardiomegaly. Low lung volumes. Similar-appearing diffuse bilateral interstitial pulmonary opacities with more patchy consolidation within the right mid lower lung. Moderate right and small left pleural effusions. No pneumothorax. Remote right rib fracture. IMPRESSION: Interval increase in right mid lower lung consolidation with persistent bilateral interstitial opacities suggestive of worsening pulmonary edema. Moderate right and small left pleural effusions. Electronically Signed   By: Lovey Newcomer M.D.   On: 01/03/2018 07:13   Dg Chest Port 1 View  Result Date: 01/02/2018 CLINICAL DATA:  Central line placement attempt. EXAM: PORTABLE CHEST 1 VIEW COMPARISON:  Chest radiograph 01/01/2018 FINDINGS: Unchanged position of left chest wall AICD leads. There is a right internal jugular vein approach central venous catheter with tip in the lower SVC. There is no pneumothorax. Unchanged small pleural effusions with associated atelectasis. Cardiomegaly with remote median sternotomy and CABG. IMPRESSION: No pneumothorax. Unchanged appearance of the chest with small pleural effusions.  Electronically Signed   By: Ulyses Jarred M.D.   On: 01/02/2018 02:06   Dg Chest Port 1 View  Result Date: 01/01/2018 CLINICAL DATA:  Acute respiratory failure EXAM: PORTABLE CHEST 1 VIEW COMPARISON:  None. FINDINGS: A right central line terminates in the central SVC. A dual lead AICD device is identified. Mild cardiomegaly. The hila and mediastinum are unremarkable. No pneumothorax. Interstitial opacities on the right greater than left. No focal infiltrate. IMPRESSION: 1. Cardiomegaly. 2. Right greater than left primarily interstitial opacities suggest the possibility of asymmetric edema. Developing pneumonia on the right is not completely excluded. Recommend clinical correlation and attention on follow-up. Electronically Signed   By: Dorise Bullion III M.D   On: 01/01/2018 20:21      LAB RESULTS: Basic Metabolic Panel: Recent Labs  Lab 01/18/18 0211 01/19/18 0404 01/20/18 0427  NA 134* 134* 133*  K 4.5 5.1 5.6*  CL 97* 97* 97*  CO2 22 20* 18*  GLUCOSE 154* 143* 189*  BUN 55* 68* 76*  CREATININE 6.59* 7.48* 8.06*  CALCIUM 8.1* 8.2* 8.0*  MG 2.5*  --   --   PHOS 7.4* 8.9* 10.4*   Liver Function Tests: Recent Labs  Lab 01/19/18 0404 01/20/18 0427  ALBUMIN 2.2* 2.2*   No results for input(s): LIPASE, AMYLASE in the last 168 hours. No results for input(s): AMMONIA in the last 168 hours. CBC: Recent Labs  Lab 01/18/18 0211 01/20/18 0427  WBC 6.8 5.6  HGB 8.1* 8.7*  HCT 26.5* 29.4*  MCV 102.7* 103.5*  PLT 114* 131*   Cardiac Enzymes: No results for input(s): CKTOTAL, CKMB, CKMBINDEX, TROPONINI in the last 168 hours. BNP: Invalid input(s): POCBNP CBG: Recent Labs  Lab 01/21/18 0737 01/21/18 1144  GLUCAP 144* 154*      Disposition and Follow-up: Discharge Instructions    Diet Carb Modified   Complete by:  As directed    Increase activity slowly   Complete by:  As directed        DISPOSITION: Residential hospice   DISCHARGE FOLLOW-UP    Time  coordinating discharge:  35 minutes  Signed:   Estill Cotta M.D. [facility] 01/21/2018, 1:23 PM Pager: [phone]

## 2018-01-21 NOTE — Progress Notes (Signed)
Jose Heath to be D/C'dHospice per MD order.  Discussed prescriptions and follow up appointments with the patient. Prescriptions given to patient, medication list explained in detail. Pt verbalized understanding.  Allergies as of 01/21/2018   No Known Allergies     Medication List    STOP taking these medications   acetaminophen-codeine 300-30 MG tablet Commonly known as:  TYLENOL #3   amiodarone 200 MG tablet Commonly known as:  PACERONE   apixaban 5 MG Tabs tablet Commonly known as:  ELIQUIS   bethanechol 25 MG tablet Commonly known as:  URECHOLINE   carvedilol 3.125 MG tablet Commonly known as:  COREG   furosemide 40 MG tablet Commonly known as:  LASIX   INVANZ 1 g injection Generic drug:  ertapenem   IRON PO   metFORMIN 500 MG tablet Commonly known as:  GLUCOPHAGE   NONFORMULARY OR COMPOUNDED ITEM   silodosin 8 MG Caps capsule Commonly known as:  RAPAFLO     TAKE these medications   acetaminophen 500 MG tablet Commonly known as:  TYLENOL Take 500 mg by mouth every 6 (six) hours as needed.   albuterol 108 (90 Base) MCG/ACT inhaler Commonly known as:  PROVENTIL HFA;VENTOLIN HFA Inhale 1-2 puffs into the lungs every 4 (four) hours as needed for shortness of breath.   collagenase ointment Commonly known as:  SANTYL Apply topically 2 (two) times daily. Apply to coccygeal Unstageable ulceration twice daily and PRN soiling.   Gerhardt's butt cream Crea Apply 1 application topically every 2 (two) hours as needed for irritation.   insulin aspart 100 UNIT/ML injection Commonly known as:  novoLOG Inject 0-9 Units into the skin 3 (three) times daily with meals. Sliding scale CBG 70 - 120: 0 units CBG 121 - 150: 1 unit,  CBG 151 - 200: 2 units,  CBG 201 - 250: 3 units,  CBG 251 - 300: 5 units,  CBG 301 - 350: 7 units,  CBG 351 - 400: 9 units   CBG > 400: 9 units and notify your MD   levothyroxine 100 MCG tablet Commonly known as:  SYNTHROID, LEVOTHROID Take 100  mcg by mouth every morning.   QUEtiapine 50 MG tablet Commonly known as:  SEROQUEL Take 1 tablet (50 mg total) by mouth at bedtime.   QVAR REDIHALER 40 MCG/ACT inhaler Generic drug:  beclomethasone Inhale 2 puffs into the lungs 2 (two) times daily.   traMADol 50 MG tablet Commonly known as:  ULTRAM Take 1 tablet (50 mg total) by mouth every 8 (eight) hours as needed for moderate pain.       Vitals:   01/21/18 0608 01/21/18 0739  BP: (!) 99/54 105/67  Pulse: (!) 58 (!) 59  Resp: 18 18  Temp: 97.8 F (36.6 C) 98.1 F (36.7 C)  SpO2: 100% 100%    Skin clean, dry and intact without evidence of skin break down, no evidence of skin tears noted. IV catheter discontinued intact. Site without signs and symptoms of complications. Dressing and pressure applied. Pt denies pain at this time. No complaints noted.  An After Visit Summary was printed and given to the patient. Patient escorted via stretcher, and D/C to [place] via [place].  Dixie Dials RN, BSN

## 2018-01-21 NOTE — Progress Notes (Signed)
Report called and given to RN at [facility].

## 2018-01-21 NOTE — Clinical Social Work Note (Signed)
Patient discharging to the [facility] today, transported by ambulance. Discharge clinicals transmitted to facility and son at bedside and is aware of discharge. CSW signing off as no other SW intervention services needed.  Jaretzy Lhommedieu Givens, MSW, LCSW Licensed Clinical Social Worker [place] [phone]

## 2018-01-21 NOTE — Progress Notes (Signed)
Daily Progress Note   Patient Name: Jose Heath       Date: 01/21/2018 DOB: Nov 28, 1950  Age: 67 y.o. MRN#: [id number] Attending Physician: Mendel Corning, MD Primary Care Physician: Patient, No Pcp Per Admit Date: 01/01/2018  Reason for Consultation/Follow-up: Establishing goals of care  Subjective: Patient is more awake and alert on today. Continues with tele-sitter for safety. He reports some sacral discomfort at this time. Otherwise denies pain. Asking for something to drink. Bedside RN at bed performing dressing change. No family is at the bedside. Patient received last HD treatment on yesterday. Continues to ask about leaving hospital. Discussed with patient that the plan is for him to be discharged today if bed is available in [place]. He was appreciative. Called and spoke with his son. We discussed the plan for discharging him today via non-emergent ems to [facility] as requested. Son verbalized appreciation. We re-discussed the goals and care to be received at hospice home. Son is aware due the discontinuation of dialysis and poor appetite patient may continue to do well for a few days, however as toxins continue to build up in his blood he may become more confused, lethargic, and will eventually pass away. He is assured that the team at hospice home will take great care of his dad and are well trained to manage any type of symptoms such as agitation, anxiety, shortness of breath, or pain. He is aware that patient would no longer have telemetry monitoring, frequent vital signs or IV fluids. He will also not have a restricted diet and be allowed to have whatever foods that he would like promoting comfort. Son verbalized understanding and was very appreciative of services.   Length  of Stay: 20  Current Medications: Scheduled Meds:  . collagenase   Topical BID  . insulin aspart  0-5 Units Subcutaneous QHS  . insulin aspart  0-9 Units Subcutaneous TID WC  . mouth rinse  15 mL Mouth Rinse BID  . QUEtiapine  50 mg Oral QHS  . sodium chloride flush  10-40 mL Intracatheter Q12H  . sodium chloride flush  10-40 mL Intracatheter Q12H    Continuous Infusions: . sodium chloride 250 mL (01/11/18 2212)    PRN Meds: sodium chloride, acetaminophen, albuterol, Gerhardt's butt cream, haloperidol lactate, ondansetron (ZOFRAN) IV, sodium chloride flush, sodium  chloride flush, traMADol  Physical Exam  Constitutional: Vital signs are normal. He appears ill.  Fragile and thin appearance.   Cardiovascular: Normal rate, regular rhythm and normal heart sounds. Exam reveals decreased pulses.  Pulmonary/Chest: Effort normal. He has decreased breath sounds.  Abdominal: Normal appearance and bowel sounds are normal.  Musculoskeletal:       Right foot: There is decreased range of motion.       Left foot: There is decreased range of motion.  Weakness, bilateral boots  Neurological: He is alert. He displays atrophy.  Skin: Skin is warm and dry. Bruising noted.  Right great toe amputation, dressing changed by RN, some yellow drainage, multiple skin tears on bilateral upper extremities.   Psychiatric: He has a normal mood and affect. His speech is normal and behavior is normal. Cognition and memory are impaired. He expresses inappropriate judgment.  dementia   Nursing note and vitals reviewed.           Vital Signs: BP 105/67 (BP Location: Right Arm)   Pulse (!) 59   Temp 98.1 F (36.7 C)   Resp 18   Ht 5\' 9"  (1.753 m)   Wt 86.2 kg (190 lb)   SpO2 100%   BMI 28.06 kg/m  SpO2: SpO2: 100 % O2 Device: O2 Device: Nasal Cannula O2 Flow Rate: O2 Flow Rate (L/min): 4 L/min  Intake/output summary:   Intake/Output Summary (Last 24 hours) at 01/21/2018 1220 Last data filed at  01/21/2018 0900 Gross per 24 hour  Intake 780 ml  Output 0 ml  Net 780 ml   LBM: Last BM Date: 01/20/18 Baseline Weight: Weight: 98.9 kg (218 lb 0.6 oz) Most recent weight: Weight: 86.2 kg (190 lb)       Palliative Assessment/Data: PPS 20%   Flowsheet Rows     Most Recent Value  Intake Tab  Referral Department  Hospitalist  Unit at Time of Referral  ICU  Palliative Care Primary Diagnosis  Nephrology  Date Notified  01/08/18  Palliative Care Type  New Palliative care  Reason for referral  Clarify Goals of Care  Date of Admission  01/01/18  Date first seen by Palliative Care  01/10/18  # of days Palliative referral response time  2 Day(s)  # of days IP prior to Palliative referral  7  Clinical Assessment  Psychosocial & Spiritual Assessment  Palliative Care Outcomes     Patient Active Problem List   Diagnosis Date Noted  . AKI (acute kidney injury) ([place]) 01/17/2018  . CKD (chronic kidney disease), stage III ([place]) 01/17/2018  . Malnutrition of moderate degree 01/07/2018  . Pressure injury of skin 01/02/2018  . Septic shock ([place]) 01/01/2018  . Arthritis 09/17/2016  . Atrial flutter ([place]) 09/17/2016  . Coronary artery disease 09/17/2016  . Diabetes mellitus ([place]) 09/17/2016  . Disease of thyroid gland 09/17/2016  . HOH (hard of hearing) 09/17/2016  . Hyperlipidemia 09/17/2016  . ICD (implantable cardioverter-defibrillator) in place 09/17/2016  . Left foot pain 09/17/2016  . Skin cancer 09/17/2016  . TSH (thyroid-stimulating hormone deficiency) 08/13/2016  . Anasarca 07/30/2016  . Charcot foot due to diabetes mellitus ([place]) 02/13/2016  . Atypical atrial flutter ([place]) 10/04/2015  . Essential hypertension 10/04/2015  . Ischemic cardiomyopathy 10/04/2015  . Pure hypercholesterolemia 10/04/2015  . Amputated toe of left foot ([place]) 08/15/2015  . Diabetic peripheral neuropathy ([place]) 08/15/2015  . Type 2 diabetes mellitus with Charcot's joint of left foot ([place]) 08/15/2015    . Cardiac defibrillator  in place 05/29/2015  . Cardiomyopathy ([place]) 05/29/2015  . Dual ICD (implantable cardioverter-defibrillator) in place 05/29/2015   Palliative Care Assessment & Plan   Patient Profile: 67 y.o.maleadmitted on [facility]28/[facility] with complaints of fatigue and no urine output x1day. He has a past medical history significant for AICD, diabetes, A-fib (on eliquis), CKD stage 3, Osteomyelitis of right first toe s/p transmetatarsal amputation, CHF (EF 25-30%), and CAD s/p CABG. Since admission was found to be in septic shockdue to UTI +/- Osteo, AKI-on-CKD, and Hyperkalemia. A Central line was placed. Patient was given 2L IVF bolus and started on broad spectrum antibiotics (Vanc and Zosyn) and vasopressors (Levophed and Dobutamine). [facility] [place] [facility] for continued care and consideration of dialysis. He is now being followed by Nephrology. He was started on CVVHD on 6/28 and came off on 7/5. BUN/CR continues to increase despite discontinuation of pressors. Palliative Medicine consulted for goals of care discussion.  Recommendations/Plan:  DNR/DNI-at son's request  Continue to treat the treatable without escalation of care. Spoke with son and we re-discussed goals of hospice care. Plan is for patient to be discharge today if bed available the hospice home of [place]. Son verbalized understanding and agreement.   CSW securing placement for disposition to hospice home in [facility].   At discharge would recommend the following for symptom management:   continuation of Seroquel at bedtime if patient can tolerate.   Haldol 1mg  every 4-6hrs as needed for agitation/anxiety  Zofran 4mg  SL every 6hrs as needed for nausea/vomiting  Robinul 1mg  po every 4 hrs as needed for excessive secretions.  Tylenol 650 mg PO/PR every 6hrs as needed for mild pain/fever.  Dilaudid 0.5 mg every 2hrs as needed for severe pain/shortness of  breath  Palliative will continue to support patient, family, and medical team during hospitalization.   Goals of Care and Additional Recommendations:  Limitations on Scope of Treatment: Full Scope Treatment-continue to treat the treatable without escalation of care.   Code Status:    Code Status Orders  (From admission, onward)        Start     Ordered   01/12/18 1316  Do not attempt resuscitation (DNR)  Continuous    Question Answer Comment  In the event of cardiac or respiratory ARREST Do not call a "code blue"   In the event of cardiac or respiratory ARREST Do not perform Intubation, CPR, defibrillation or ACLS   In the event of cardiac or respiratory ARREST Use medication by any route, position, wound care, and other measures to relive pain and suffering. May use oxygen, suction and manual treatment of airway obstruction as needed for comfort.      01/12/18 1315    Code Status History    Date Active Date Inactive Code Status Order ID Comments User Context   01/01/2018 1938 01/12/2018 1315 Full Code [id number]  Reyne Dumas, MD Inpatient       Prognosis:   Days-Weeks- in the setting ofAKI,chronic kidney disease, discontinuation of dialysis, poor p.o. intake, immobility, acute on chronic biventricular CHF, EF 25 -30%, altered mental status, shock, anemia, osteomyelitis status post amputation with poor wound healing, decubitus ulcers, moderate protein malnutrition, and poor functional status.  Discharge Planning:  Hospice facility  Per son's request in [place], [place].   Care plan was discussed with patient's son, Dr. Tana Coast and bedside RN.   Thank you for allowing the Palliative Medicine Team to assist in the care of this  patient.   Total Time: 59min.   Greater than 50% of this time was spent counseling and coordinating care related to the above assessment and plan.   Alda Lea, NP-BC Palliative Medicine Team  Phone: [phone] Fax:  [phone] Pager: [phone] Amion: Bjorn Pippin    Please contact Palliative Medicine Team phone at [phone] for questions and concerns.

## 2018-01-21 NOTE — Progress Notes (Addendum)
[facility] ROUNDING NOTE   Subjective:   Interval History: has no complaints today. He denied pain stating that he was comfortable. He would only like to know when he will be able to go home.  Objective:  Vital signs in last 24 hours:  Temp:  [97.8 F (36.6 C)-98.9 F (37.2 C)] 98.1 F (36.7 C) (07/18 0739) Pulse Rate:  [57-70] 59 (07/18 0739) Resp:  [16-18] 18 (07/18 0739) BP: (96-110)/(54-67) 105/67 (07/18 0739) SpO2:  [95 %-100 %] 100 % (07/18 0739) Weight:  [190 lb (86.2 kg)] 190 lb (86.2 kg) (07/17 1200)  Weight change: 2 lb 8 oz (1.134 kg) Filed Weights   01/19/18 2152 01/20/18 0840 01/20/18 1200  Weight: 189 lb (85.7 kg) 191 lb 8 oz (86.9 kg) 190 lb (86.2 kg)    Intake/Output: I/O last 3 completed shifts: In: 300 [P.O.:300] Out: 1500 [Other:1500]   Intake/Output this shift:  Total I/O In: 480 [P.O.:480] Out: -   Physical Exam: General: A/O x4, in no acute distress. Appears comfortable CVS- RRR RS- CTA ABD- BS present soft non-distended EXT- no edema   Basic Metabolic Panel: Recent Labs  Lab 01/15/18 0447 01/16/18 0347 01/17/18 0506 01/18/18 0211 01/19/18 0404 01/20/18 0427  NA 132*  132* 129*  129* 133*  134* 134* 134* 133*  K 4.6  4.5 4.5  4.5 3.8  3.9 4.5 5.1 5.6*  CL 93*  93* 91*  91* 97*  98 97* 97* 97*  CO2 14*  17* 20*  19* 21*  22 22 20* 18*  GLUCOSE 93  95 181*  179* 172*  173* 154* 143* 189*  BUN 56*  57* 67*  68* 46*  45* 55* 68* 76*  CREATININE 6.92*  6.73* 7.83*  7.79* 5.64*  5.59* 6.59* 7.48* 8.06*  CALCIUM 8.0*  8.1* 8.0*  8.0* 7.9*  7.9* 8.1* 8.2* 8.0*  MG 2.7* 2.9* 2.6* 2.5*  --   --   PHOS 8.1* 8.4* 6.2* 7.4* 8.9* 10.4*    Liver Function Tests: Recent Labs  Lab 01/16/18 0347 01/17/18 0506 01/18/18 0211 01/19/18 0404 01/20/18 0427  ALBUMIN 2.1* 2.2* 2.2* 2.2* 2.2*   No results for input(s): LIPASE, AMYLASE in the last 168 hours. No results for input(s): AMMONIA in the last 168  hours.  CBC: Recent Labs  Lab 01/15/18 0447 01/16/18 0347 01/17/18 0506 01/18/18 0211 01/20/18 0427  WBC 6.1 6.7 6.3 6.8 5.6  HGB 8.1* 7.9* 8.0* 8.1* 8.7*  HCT 27.2* 26.0* 25.5* 26.5* 29.4*  MCV 103.4* 101.2* 100.8* 102.7* 103.5*  PLT 116* 132* 132* 114* 131*    Cardiac Enzymes: No results for input(s): CKTOTAL, CKMB, CKMBINDEX, TROPONINI in the last 168 hours.  BNP: Invalid input(s): POCBNP  CBG: Recent Labs  Lab 01/20/18 0731 01/20/18 1337 01/20/18 1657 01/20/18 2235 01/21/18 0737  GLUCAP 174* 102* 103* 142* 144*    Coagulation Studies: No results for input(s): LABPROT, INR in the last 72 hours.  Urinalysis: No results for input(s): COLORURINE, LABSPEC, PHURINE, GLUCOSEU, HGBUR, BILIRUBINUR, KETONESUR, PROTEINUR, UROBILINOGEN, NITRITE, LEUKOCYTESUR in the last 72 hours.  Invalid input(s): APPERANCEUR    Imaging: No results found.   Medications:   . sodium chloride 250 mL (01/11/18 2212)   . Chlorhexidine Gluconate Cloth  6 each Topical Daily  . Chlorhexidine Gluconate Cloth  6 each Topical Q0600  . collagenase   Topical BID  . feeding supplement (NEPRO CARB STEADY)  237 mL Oral BID BM  . insulin aspart  0-5 Units  Subcutaneous QHS  . insulin aspart  0-9 Units Subcutaneous TID WC  . levothyroxine  100 mcg Oral QAC breakfast  . mouth rinse  15 mL Mouth Rinse BID  . QUEtiapine  50 mg Oral QHS  . sodium chloride flush  10-40 mL Intracatheter Q12H  . sodium chloride flush  10-40 mL Intracatheter Q12H   sodium chloride, acetaminophen, albuterol, Gerhardt's butt cream, haloperidol lactate, ondansetron (ZOFRAN) IV, sodium chloride flush, sodium chloride flush, traMADol  Assessment/ Plan:  Jose Heath is a 67 yo M w/ a PMHx notable for CAD s/p CABG, HFrEF of 25-30% w/ AICD placed, DMII, A-fib on Eliquis, CKD III and osteomyelitis of the right first toe s/p amputation who presented from [place] [facility] w/ fatigue and anuria. Noted to be in septic  shock 2/2 urinary source w/ ARI on CKD and marked hyperkalemia. Tx w/ Vanc/Zosyn w/ pressors started. Plan was to attempt a week of HD and monitor for improvement where without such a palliative approach would then be taken.The patients renal function did not improve over the short period of time allotted which is not entirely unexpected. However, given his comorbidities a palliative approach was chosen by the patients family.    Acute renal injury 2/2 urosepsis, NSAIDS, ARB multifactorial-One week trial of HD attempted. HD on 07/17 with expected improvement in BUN/Cr. A palliative approach has been taken by family. As he has chosen palliative care we will likely sign off.   ANEMIA-Hgb stable at 8.7on 07/17, continue tomonitor as indicated  ACCESS-Left femoral HD catheter site appearsnonedematous, nonerythematous   LOS: [place], MD Internal MedicineResident, PGY-2

## 2018-01-27 ENCOUNTER — Encounter: Payer: Medicare HMO | Admitting: Sports Medicine

## 2018-02-03 ENCOUNTER — Encounter: Payer: Medicare HMO | Admitting: Sports Medicine

## 2018-02-04 DEATH — deceased

## 2019-06-03 IMAGING — US US RENAL
1 series · 14 of 21 positions shown · non-contrast
Comparison: None.

CLINICAL DATA: 66-year-old male with acute renal insufficiency.

EXAM:
RENAL / URINARY TRACT ULTRASOUND COMPLETE

[Series 1: us renal · 0.27mm/px · 14 of 21 slices shown]
[im 1/21]
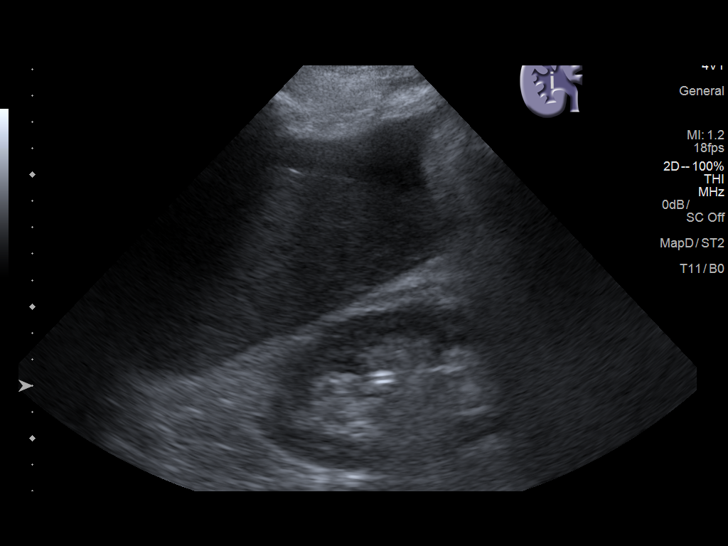
[im 3/21]
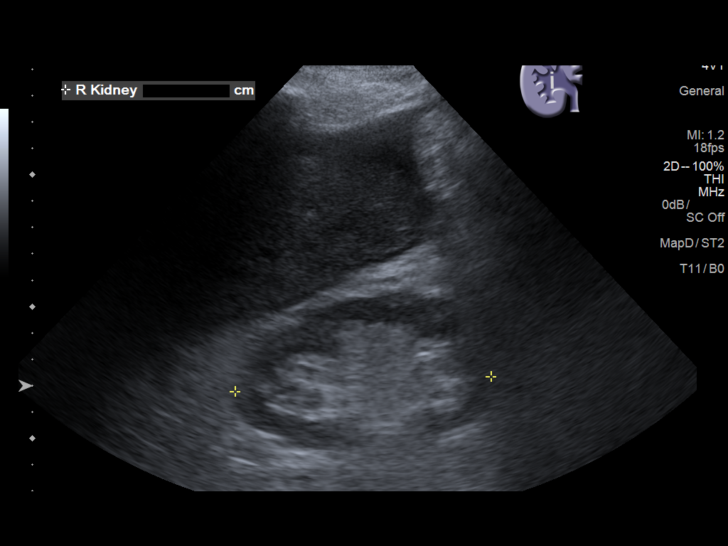
[im 4/21]
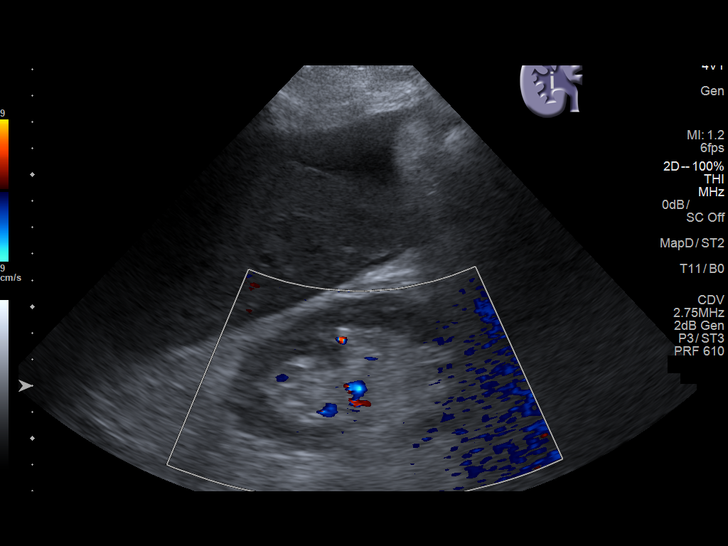
[im 6/21]
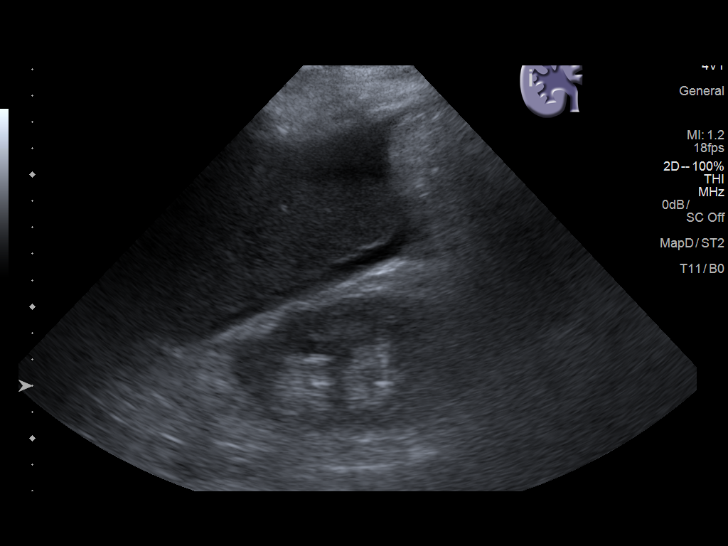
[im 7/21]
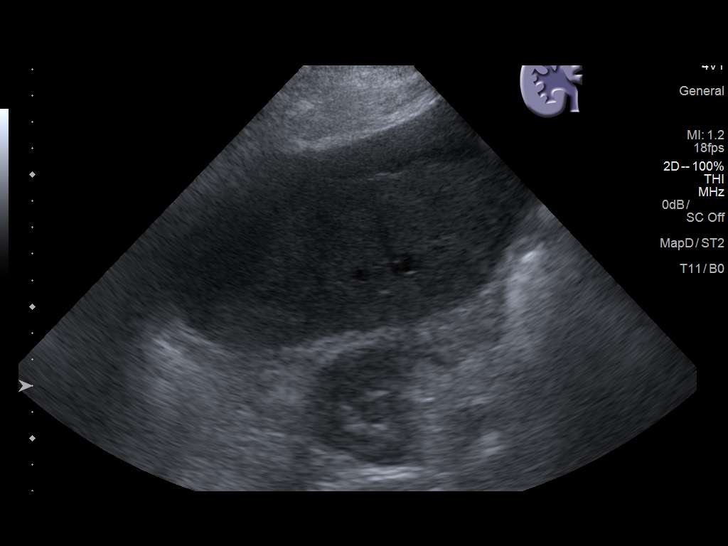
[im 9/21]
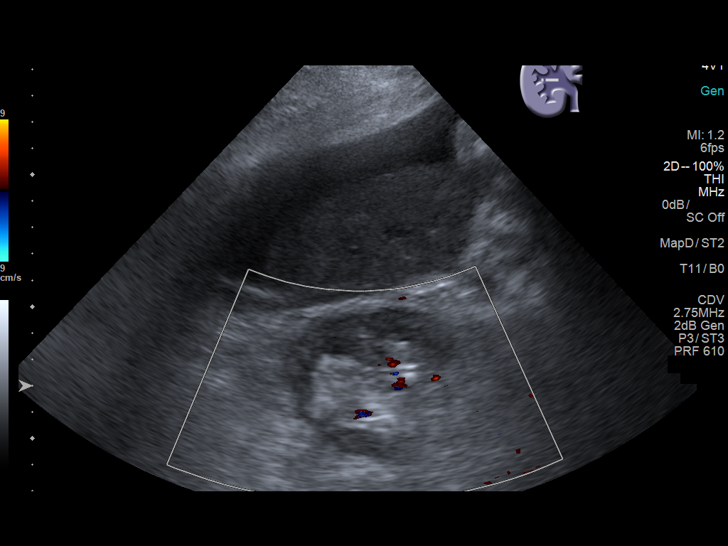
[im 10/21]
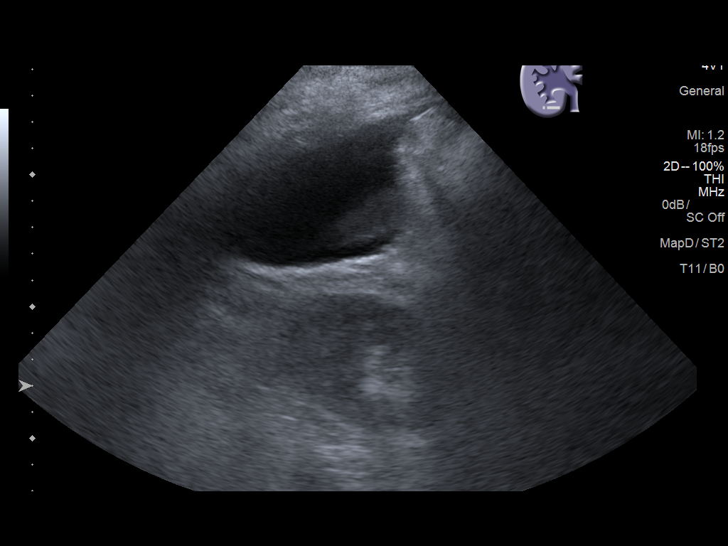
[im 12/21]
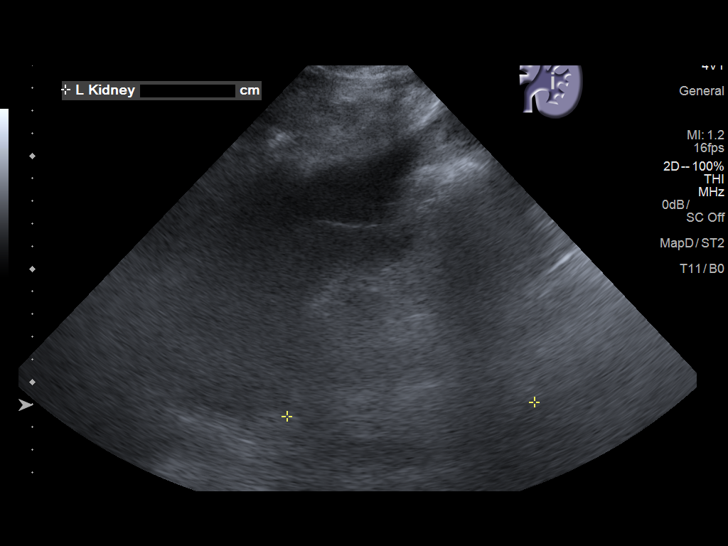
[im 13/21]
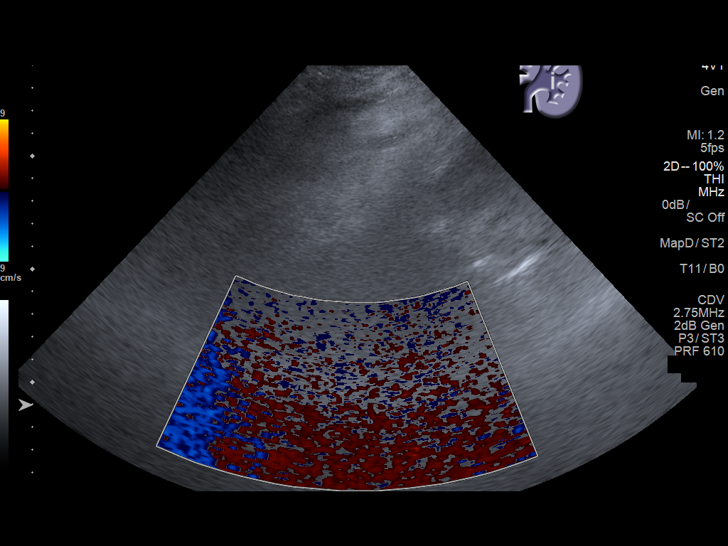
[im 15/21]
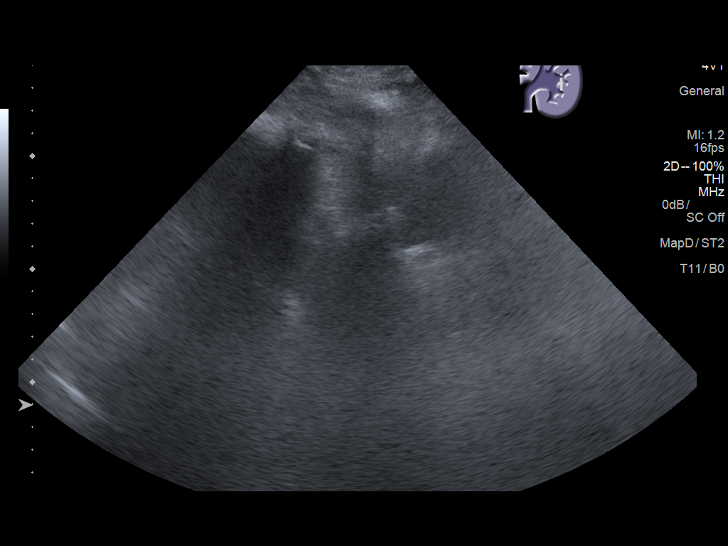
[im 16/21]
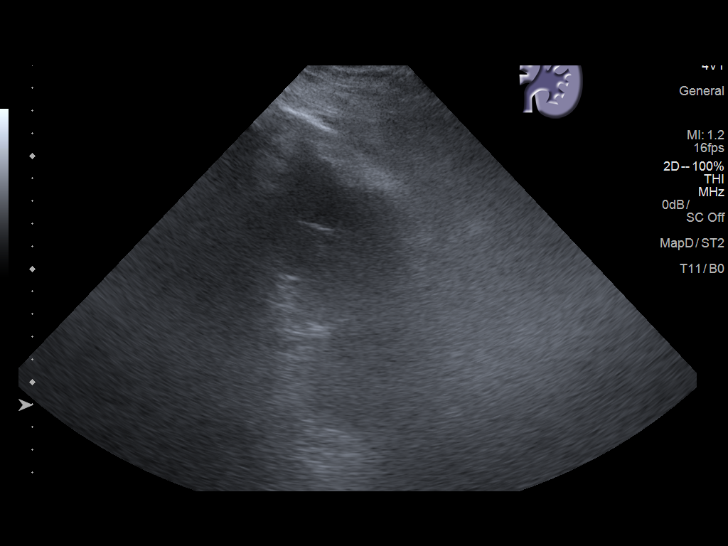
[im 18/21]
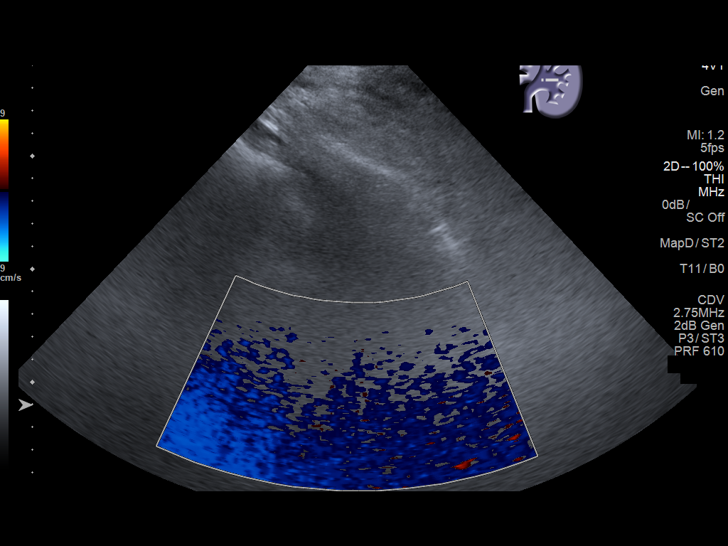
[im 19/21]
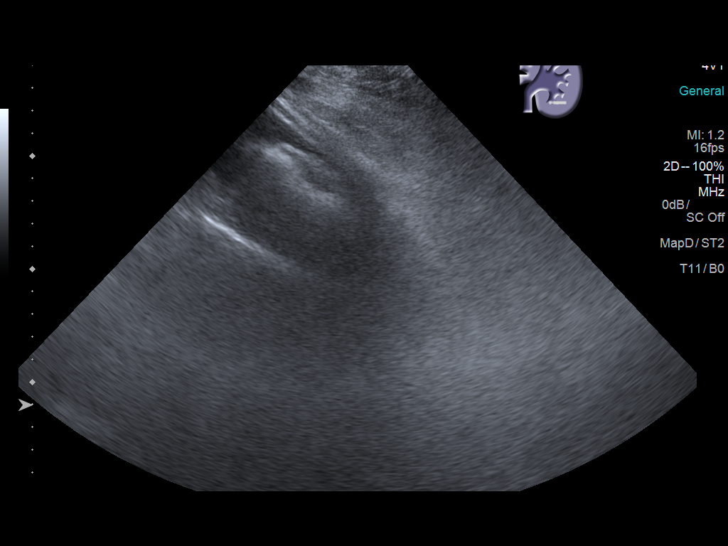
[im 21/21]
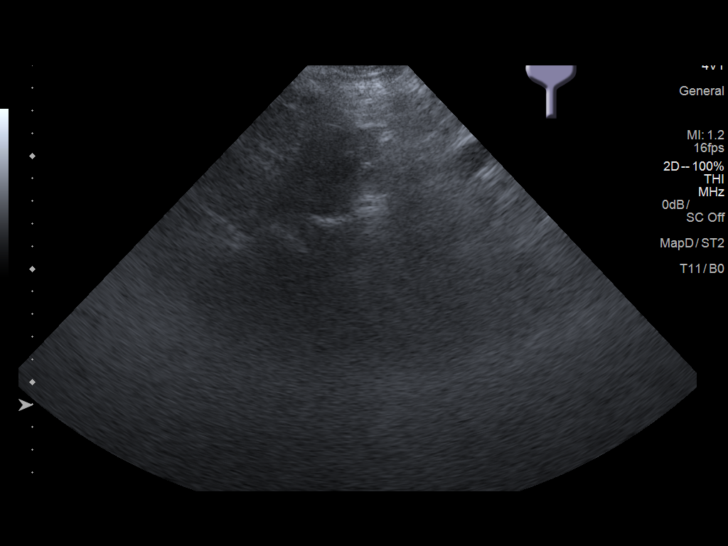

[14 of 21 positions shown; findings below may reference images not displayed]

FINDINGS: Evaluation is limited due to patient's body habitus.

Right Kidney:

Length: 10 cm. Mild parenchymal atrophy. No hydronephrosis or
shadowing stone.

Left Kidney:

Length: 11 cm. The left kidney is poorly visualized. No definite
hydronephrosis.

Bladder:

Partially contracted around a Foley catheter.

There is ascites with irregular appearance of the liver likely
cirrhosis. Clinical correlation is recommended.
IMPRESSION: 1. Poorly visualized kidneys secondary to body habitus. No definite
hydronephrosis.
2. Findings of cirrhosis as well as a small ascites.

## 2019-06-16 IMAGING — US US ABDOMEN LIMITED
1 series · 9 of 9 positions shown · non-contrast
Comparison: None.

CLINICAL DATA: Ascites.

EXAM:
LIMITED ABDOMEN ULTRASOUND FOR ASCITES
TECHNIQUE: Limited ultrasound survey for ascites was performed in all four
abdominal quadrants.

[Series 1: us abdomen limited · 0.26mm/px · 9 of 9 slices shown]
[im 1/9]
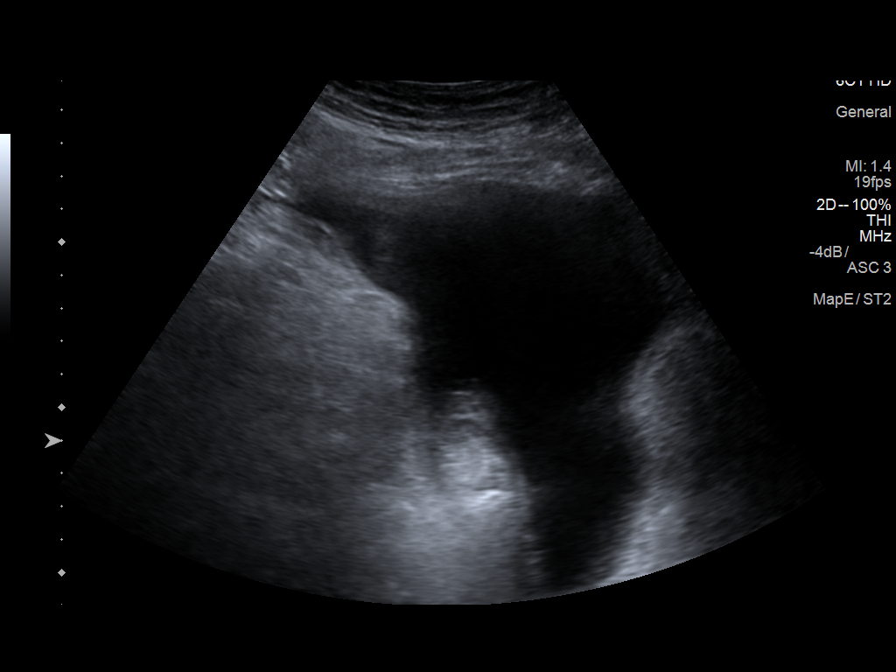
[im 2/9]
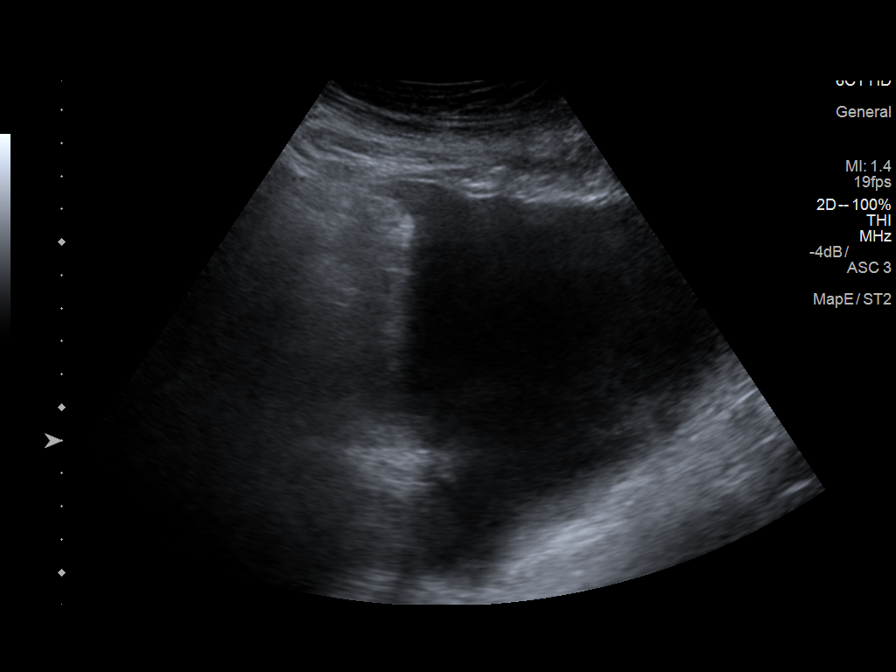
[im 3/9]
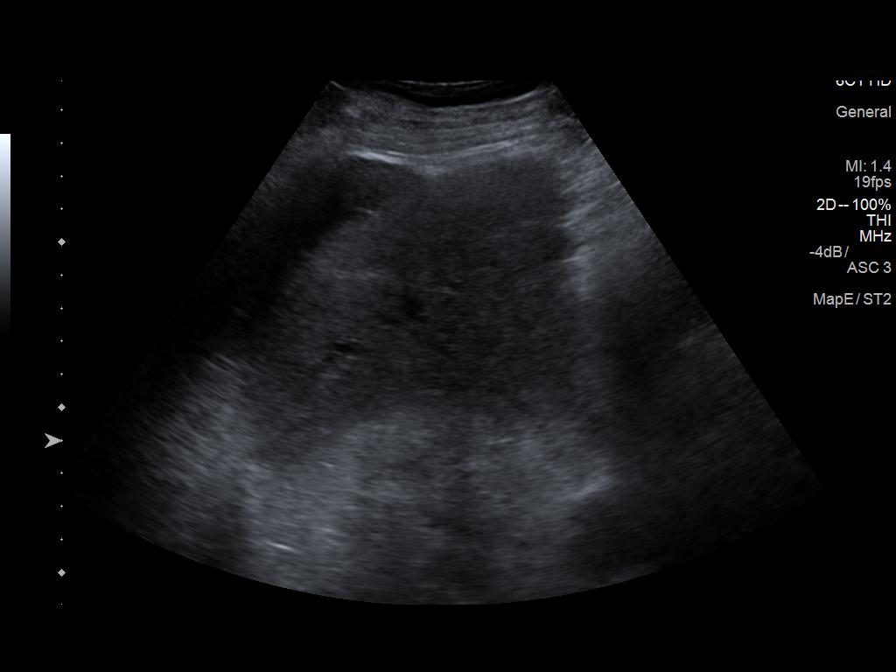
[im 4/9]
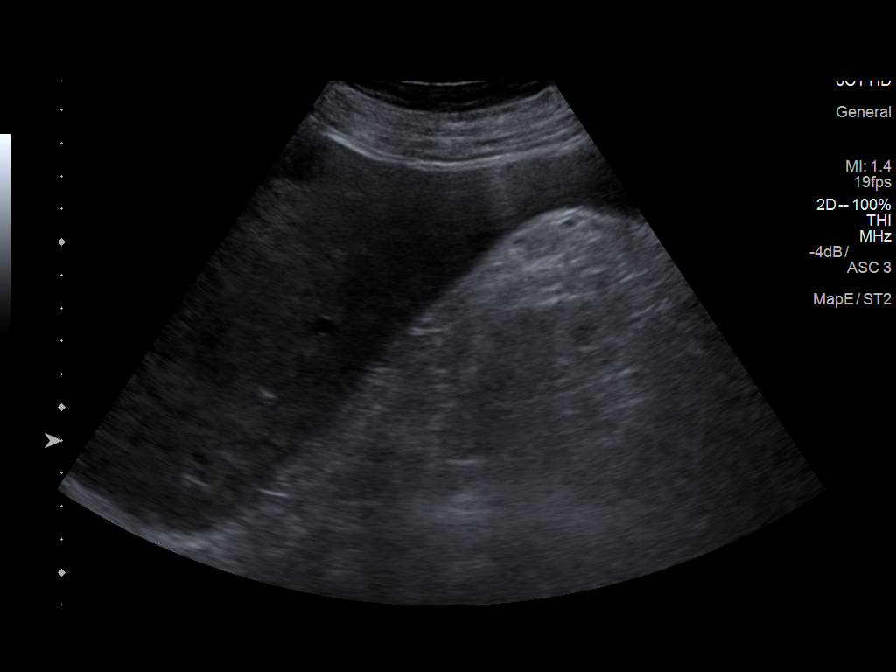
[im 5/9]
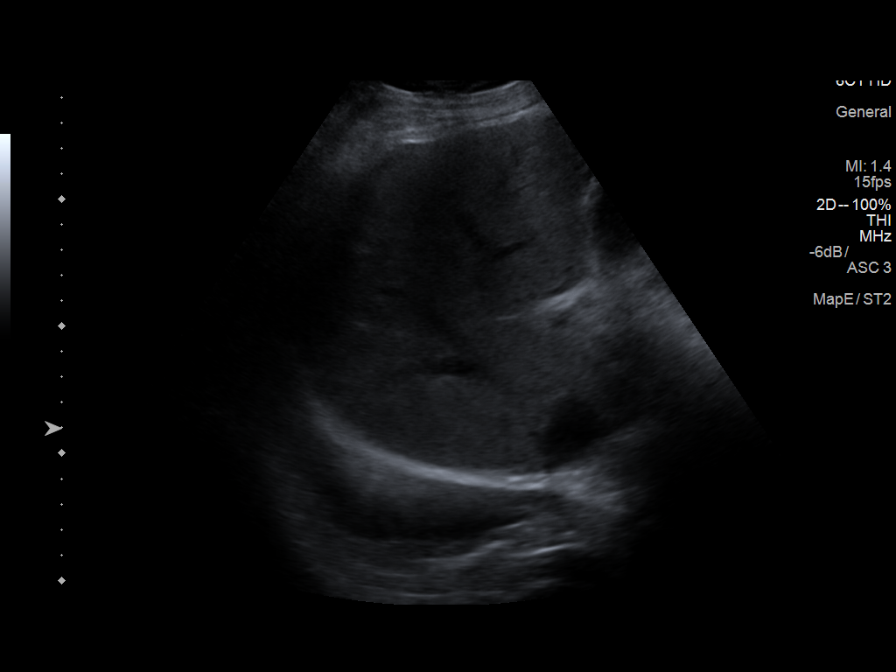
[im 6/9]
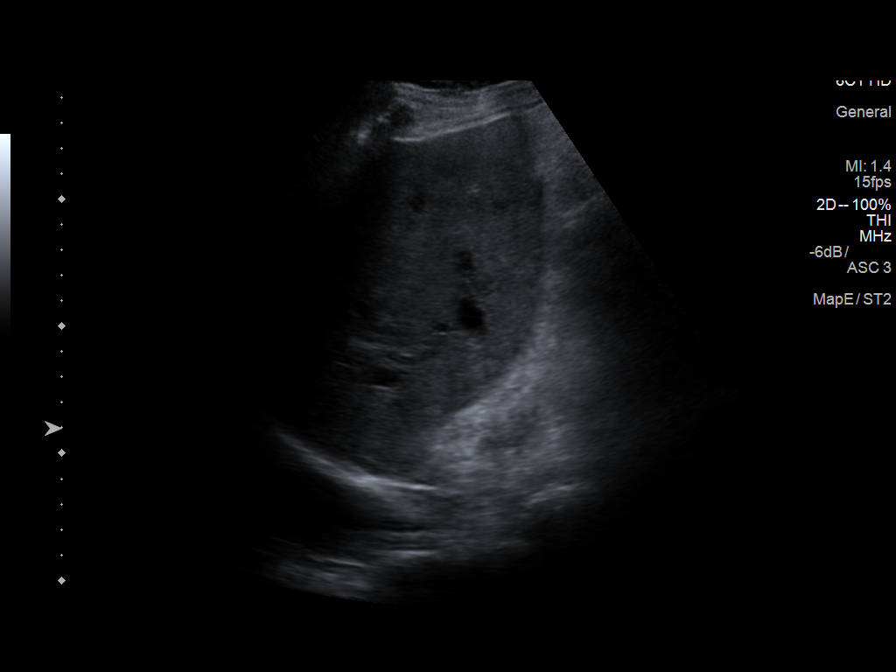
[im 7/9]
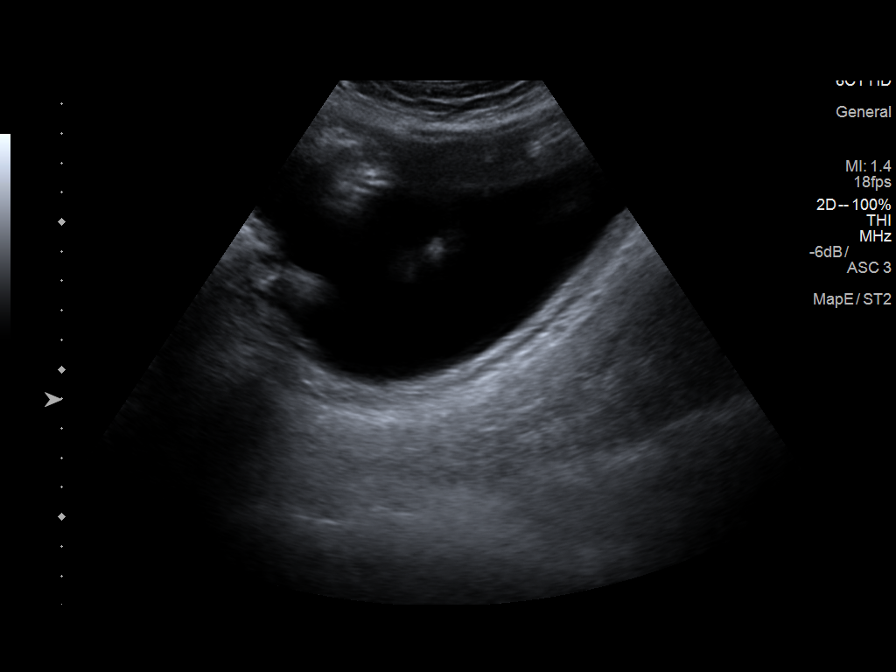
[im 8/9]
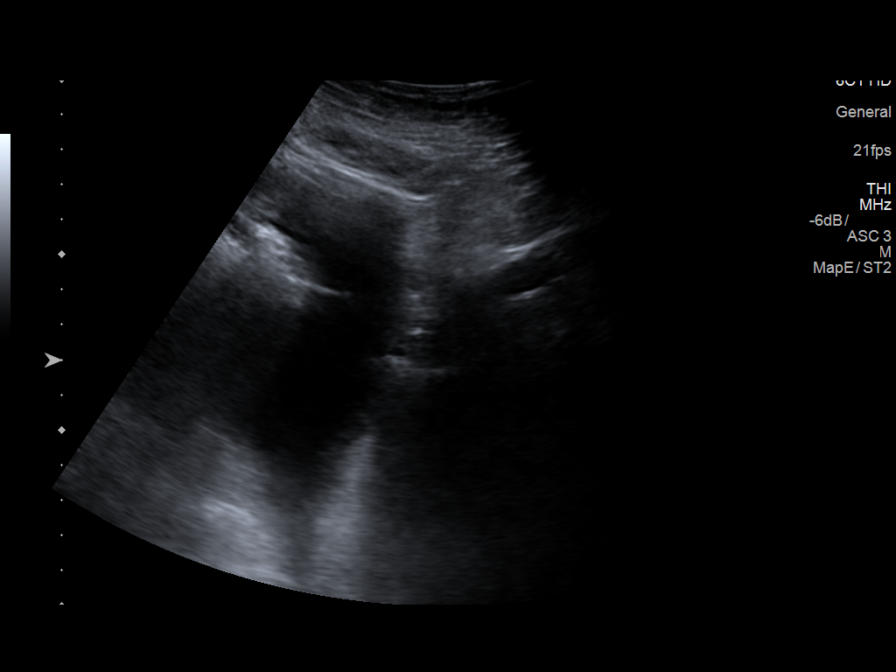
[im 9/9]
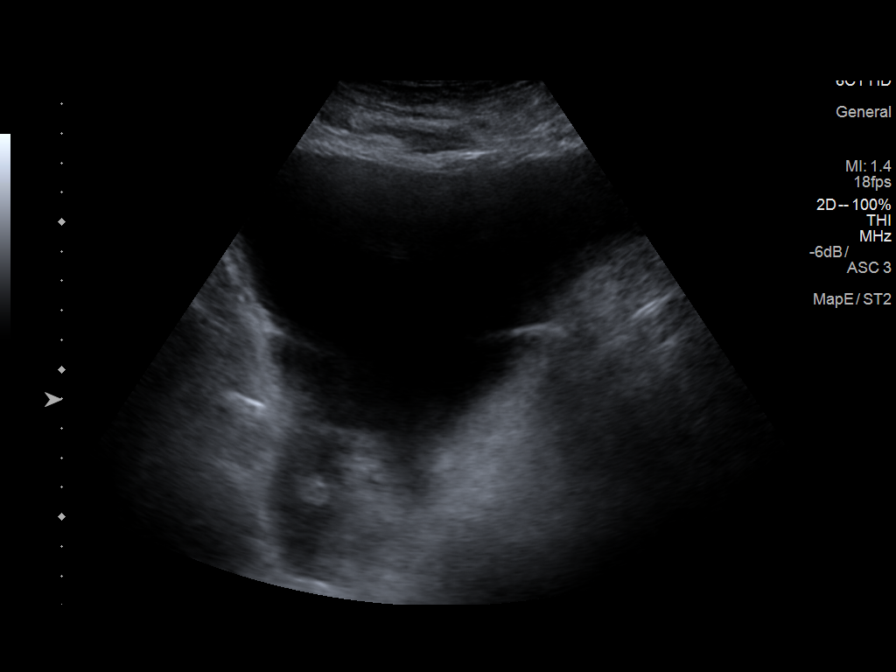

[9 of 9 positions shown; findings below may reference images not displayed]

FINDINGS: A small right pleural effusion is noted. Four-quadrant assessment
for ascites demonstrates a moderate to large volume of ascites in
all 4 quadrants spanning at least 8-10 cm in depth.
IMPRESSION: Ascites is noted in all 4 quadrants of the abdomen imaged. Small
right pleural effusion is also noted.

## 2020-06-21 IMAGING — DX DG CHEST 1V PORT
1 series · 1 of 1 positions shown · non-contrast
Comparison: None.

CLINICAL DATA: Acute respiratory failure

EXAM:
PORTABLE CHEST 1 VIEW

[chest]
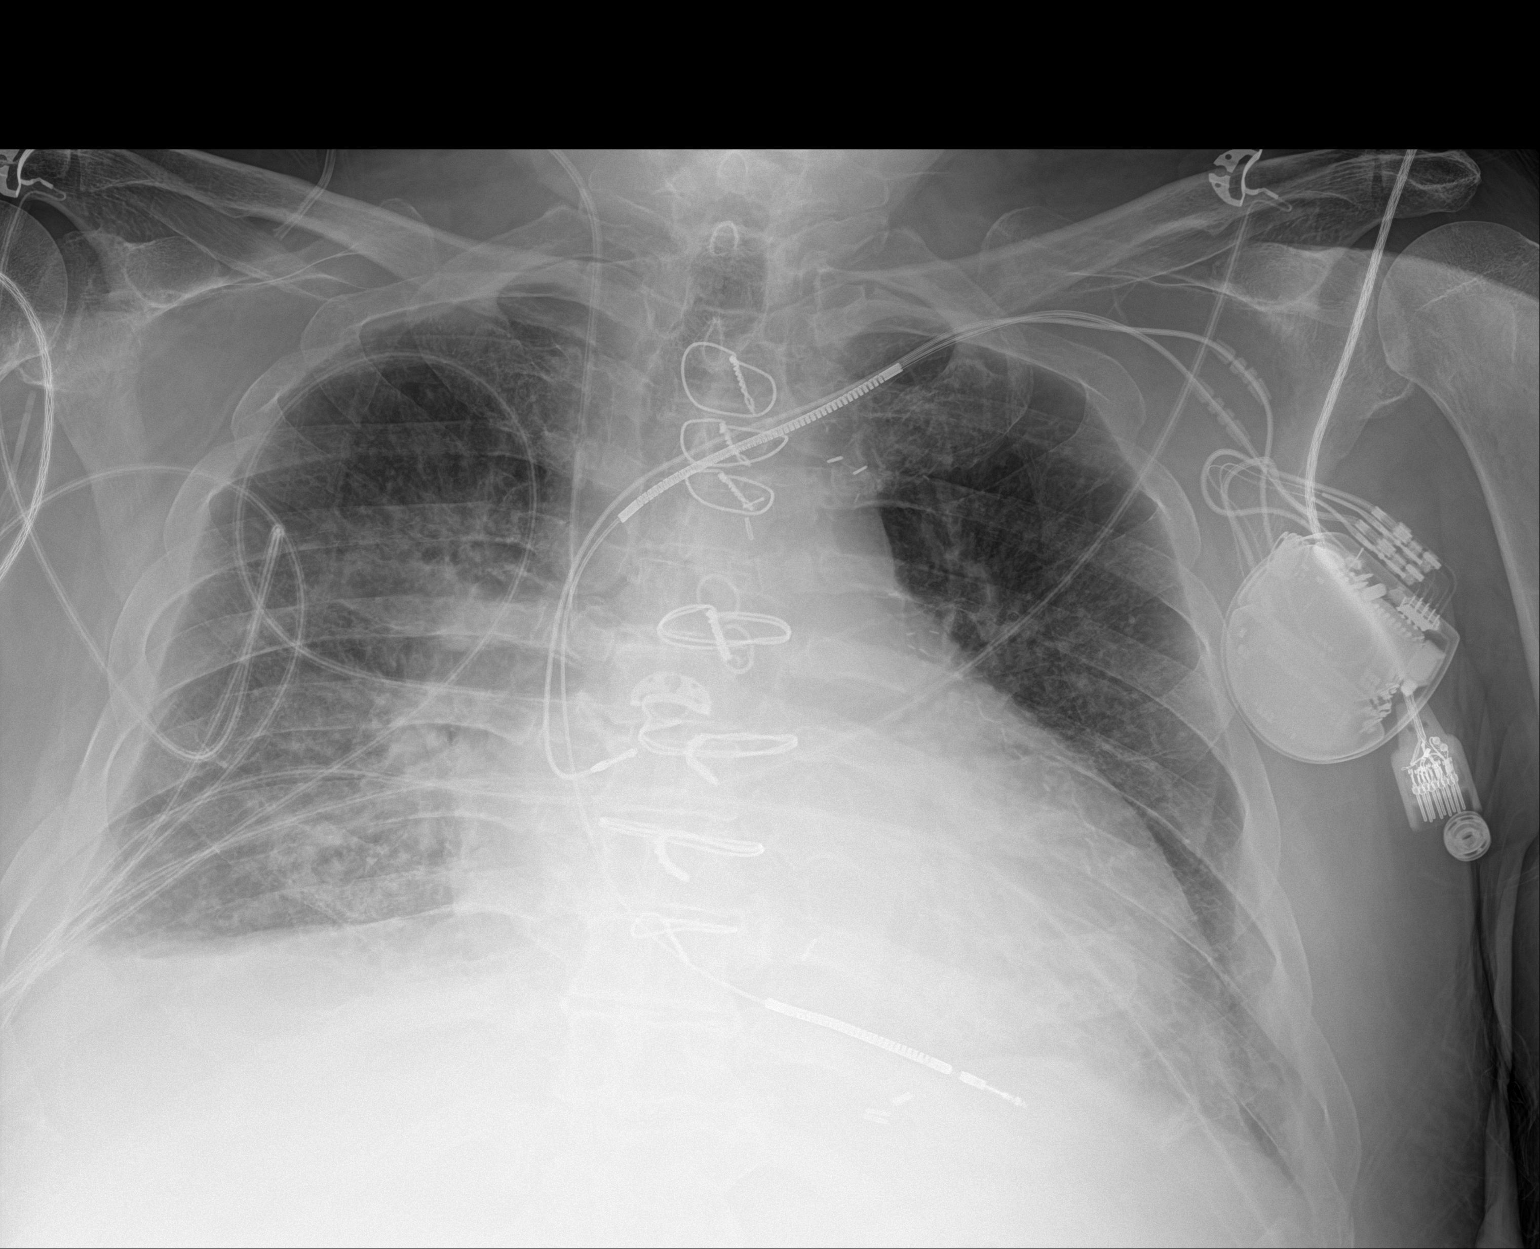

[1 of 1 positions shown; findings below may reference images not displayed]

FINDINGS: A right central line terminates in the central SVC. A dual lead AICD
device is identified. Mild cardiomegaly. The hila and mediastinum
are unremarkable. No pneumothorax. Interstitial opacities on the
right greater than left. No focal infiltrate.
IMPRESSION: 1. Cardiomegaly.
2. Right greater than left primarily interstitial opacities suggest
the possibility of asymmetric edema. Developing pneumonia on the
right is not completely excluded. Recommend clinical correlation and
attention on follow-up.
